# Patient Record
Sex: Male | Born: 1945 | Race: White | Hispanic: No | Marital: Married | Smoking: Never smoker
Health system: Southern US, Community
[De-identification: ages and names within clinical notes are randomized; demographics above are authoritative.]

## PROBLEM LIST (undated history)

## (undated) DIAGNOSIS — I2510 Atherosclerotic heart disease of native coronary artery without angina pectoris: Secondary | ICD-10-CM

## (undated) DIAGNOSIS — E039 Hypothyroidism, unspecified: Secondary | ICD-10-CM

## (undated) DIAGNOSIS — K219 Gastro-esophageal reflux disease without esophagitis: Secondary | ICD-10-CM

## (undated) DIAGNOSIS — E7800 Pure hypercholesterolemia, unspecified: Secondary | ICD-10-CM

## (undated) DIAGNOSIS — I219 Acute myocardial infarction, unspecified: Secondary | ICD-10-CM

## (undated) DIAGNOSIS — G4730 Sleep apnea, unspecified: Secondary | ICD-10-CM

## (undated) DIAGNOSIS — I10 Essential (primary) hypertension: Secondary | ICD-10-CM

## (undated) DIAGNOSIS — N189 Chronic kidney disease, unspecified: Secondary | ICD-10-CM

## (undated) HISTORY — DX: Sleep apnea, unspecified: G47.30

## (undated) HISTORY — DX: Essential (primary) hypertension: I10

## (undated) HISTORY — DX: Hypothyroidism, unspecified: E03.9

## (undated) HISTORY — DX: Chronic kidney disease, unspecified: N18.9

## (undated) HISTORY — DX: Atherosclerotic heart disease of native coronary artery without angina pectoris: I25.10

## (undated) HISTORY — DX: Acute myocardial infarction, unspecified: I21.9

## (undated) HISTORY — DX: Gastro-esophageal reflux disease without esophagitis: K21.9

## (undated) HISTORY — DX: Pure hypercholesterolemia, unspecified: E78.00

---
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
## 2010-10-30 ENCOUNTER — Encounter: Payer: Self-pay | Admitting: Family Medicine

## 2010-10-30 DIAGNOSIS — E039 Hypothyroidism, unspecified: Secondary | ICD-10-CM | POA: Insufficient documentation

## 2010-10-30 DIAGNOSIS — E7800 Pure hypercholesterolemia, unspecified: Secondary | ICD-10-CM | POA: Insufficient documentation

## 2010-10-30 DIAGNOSIS — I10 Essential (primary) hypertension: Secondary | ICD-10-CM | POA: Insufficient documentation

## 2010-10-30 DIAGNOSIS — K5190 Ulcerative colitis, unspecified, without complications: Secondary | ICD-10-CM | POA: Insufficient documentation

## 2011-05-07 DIAGNOSIS — K639 Disease of intestine, unspecified: Secondary | ICD-10-CM | POA: Diagnosis not present

## 2011-05-07 DIAGNOSIS — K5150 Left sided colitis without complications: Secondary | ICD-10-CM | POA: Diagnosis not present

## 2011-05-07 DIAGNOSIS — K648 Other hemorrhoids: Secondary | ICD-10-CM | POA: Diagnosis not present

## 2011-05-07 DIAGNOSIS — K6389 Other specified diseases of intestine: Secondary | ICD-10-CM | POA: Diagnosis not present

## 2011-05-07 DIAGNOSIS — D126 Benign neoplasm of colon, unspecified: Secondary | ICD-10-CM | POA: Diagnosis not present

## 2011-05-07 DIAGNOSIS — K5190 Ulcerative colitis, unspecified, without complications: Secondary | ICD-10-CM | POA: Diagnosis not present

## 2011-10-29 DIAGNOSIS — E785 Hyperlipidemia, unspecified: Secondary | ICD-10-CM | POA: Diagnosis not present

## 2011-10-29 DIAGNOSIS — I10 Essential (primary) hypertension: Secondary | ICD-10-CM | POA: Diagnosis not present

## 2011-10-29 DIAGNOSIS — E039 Hypothyroidism, unspecified: Secondary | ICD-10-CM | POA: Diagnosis not present

## 2011-10-29 DIAGNOSIS — Z125 Encounter for screening for malignant neoplasm of prostate: Secondary | ICD-10-CM | POA: Diagnosis not present

## 2011-10-29 DIAGNOSIS — E559 Vitamin D deficiency, unspecified: Secondary | ICD-10-CM | POA: Diagnosis not present

## 2011-11-05 DIAGNOSIS — E785 Hyperlipidemia, unspecified: Secondary | ICD-10-CM | POA: Diagnosis not present

## 2011-11-05 DIAGNOSIS — I10 Essential (primary) hypertension: Secondary | ICD-10-CM | POA: Diagnosis not present

## 2011-11-21 DIAGNOSIS — D126 Benign neoplasm of colon, unspecified: Secondary | ICD-10-CM | POA: Diagnosis not present

## 2011-11-21 DIAGNOSIS — K5900 Constipation, unspecified: Secondary | ICD-10-CM | POA: Diagnosis not present

## 2011-11-21 DIAGNOSIS — K5150 Left sided colitis without complications: Secondary | ICD-10-CM | POA: Diagnosis not present

## 2011-12-03 DIAGNOSIS — K6389 Other specified diseases of intestine: Secondary | ICD-10-CM | POA: Diagnosis not present

## 2011-12-03 DIAGNOSIS — K228 Other specified diseases of esophagus: Secondary | ICD-10-CM | POA: Diagnosis not present

## 2011-12-03 DIAGNOSIS — K2970 Gastritis, unspecified, without bleeding: Secondary | ICD-10-CM | POA: Diagnosis not present

## 2011-12-03 DIAGNOSIS — K319 Disease of stomach and duodenum, unspecified: Secondary | ICD-10-CM | POA: Diagnosis not present

## 2011-12-03 DIAGNOSIS — K269 Duodenal ulcer, unspecified as acute or chronic, without hemorrhage or perforation: Secondary | ICD-10-CM | POA: Diagnosis not present

## 2011-12-03 DIAGNOSIS — K2990 Gastroduodenitis, unspecified, without bleeding: Secondary | ICD-10-CM | POA: Diagnosis not present

## 2011-12-03 DIAGNOSIS — D126 Benign neoplasm of colon, unspecified: Secondary | ICD-10-CM | POA: Diagnosis not present

## 2011-12-03 DIAGNOSIS — K259 Gastric ulcer, unspecified as acute or chronic, without hemorrhage or perforation: Secondary | ICD-10-CM | POA: Diagnosis not present

## 2011-12-03 DIAGNOSIS — K5120 Ulcerative (chronic) proctitis without complications: Secondary | ICD-10-CM | POA: Diagnosis not present

## 2011-12-03 DIAGNOSIS — D131 Benign neoplasm of stomach: Secondary | ICD-10-CM | POA: Diagnosis not present

## 2011-12-03 DIAGNOSIS — K5150 Left sided colitis without complications: Secondary | ICD-10-CM | POA: Diagnosis not present

## 2012-01-21 DIAGNOSIS — D131 Benign neoplasm of stomach: Secondary | ICD-10-CM | POA: Diagnosis not present

## 2012-01-21 DIAGNOSIS — K228 Other specified diseases of esophagus: Secondary | ICD-10-CM | POA: Diagnosis not present

## 2012-01-21 DIAGNOSIS — K279 Peptic ulcer, site unspecified, unspecified as acute or chronic, without hemorrhage or perforation: Secondary | ICD-10-CM | POA: Diagnosis not present

## 2012-01-21 DIAGNOSIS — K2990 Gastroduodenitis, unspecified, without bleeding: Secondary | ICD-10-CM | POA: Diagnosis not present

## 2012-01-21 DIAGNOSIS — D490 Neoplasm of unspecified behavior of digestive system: Secondary | ICD-10-CM | POA: Diagnosis not present

## 2012-01-21 DIAGNOSIS — K2970 Gastritis, unspecified, without bleeding: Secondary | ICD-10-CM | POA: Diagnosis not present

## 2012-01-21 DIAGNOSIS — Z8711 Personal history of peptic ulcer disease: Secondary | ICD-10-CM | POA: Diagnosis not present

## 2012-01-21 DIAGNOSIS — K319 Disease of stomach and duodenum, unspecified: Secondary | ICD-10-CM | POA: Diagnosis not present

## 2012-02-27 DIAGNOSIS — R933 Abnormal findings on diagnostic imaging of other parts of digestive tract: Secondary | ICD-10-CM | POA: Diagnosis not present

## 2012-02-27 DIAGNOSIS — K5190 Ulcerative colitis, unspecified, without complications: Secondary | ICD-10-CM | POA: Diagnosis not present

## 2012-02-27 DIAGNOSIS — I10 Essential (primary) hypertension: Secondary | ICD-10-CM | POA: Diagnosis not present

## 2012-02-27 DIAGNOSIS — K219 Gastro-esophageal reflux disease without esophagitis: Secondary | ICD-10-CM | POA: Diagnosis not present

## 2012-02-27 DIAGNOSIS — K319 Disease of stomach and duodenum, unspecified: Secondary | ICD-10-CM | POA: Diagnosis not present

## 2012-07-17 ENCOUNTER — Other Ambulatory Visit (INDEPENDENT_AMBULATORY_CARE_PROVIDER_SITE_OTHER): Payer: Medicare Other

## 2012-07-17 DIAGNOSIS — Z79899 Other long term (current) drug therapy: Secondary | ICD-10-CM | POA: Diagnosis not present

## 2012-07-17 DIAGNOSIS — E038 Other specified hypothyroidism: Secondary | ICD-10-CM | POA: Diagnosis not present

## 2012-07-17 DIAGNOSIS — E785 Hyperlipidemia, unspecified: Secondary | ICD-10-CM

## 2012-07-17 LAB — CBC WITH DIFFERENTIAL/PLATELET
Eosinophils Absolute: 0.2 10*3/uL (ref 0.0–0.7)
Eosinophils Relative: 4 % (ref 0–5)
HCT: 38.8 % — ABNORMAL LOW (ref 39.0–52.0)
Hemoglobin: 13 g/dL (ref 13.0–17.0)
Lymphs Abs: 1.3 10*3/uL (ref 0.7–4.0)
MCH: 30.6 pg (ref 26.0–34.0)
MCV: 91.3 fL (ref 78.0–100.0)
Monocytes Absolute: 0.5 10*3/uL (ref 0.1–1.0)
Monocytes Relative: 9 % (ref 3–12)
Neutrophils Relative %: 63 % (ref 43–77)
RBC: 4.25 MIL/uL (ref 4.22–5.81)

## 2012-07-18 LAB — LIPID PANEL
Cholesterol: 146 mg/dL (ref 0–200)
Triglycerides: 99 mg/dL (ref <150)
VLDL: 20 mg/dL (ref 0–40)

## 2012-07-18 LAB — COMPREHENSIVE METABOLIC PANEL
BUN: 12 mg/dL (ref 6–23)
CO2: 29 mEq/L (ref 19–32)
Glucose, Bld: 109 mg/dL — ABNORMAL HIGH (ref 70–99)
Sodium: 143 mEq/L (ref 135–145)
Total Bilirubin: 0.4 mg/dL (ref 0.3–1.2)
Total Protein: 6.4 g/dL (ref 6.0–8.3)

## 2012-08-01 ENCOUNTER — Encounter: Payer: Self-pay | Admitting: Family Medicine

## 2012-08-01 ENCOUNTER — Ambulatory Visit (INDEPENDENT_AMBULATORY_CARE_PROVIDER_SITE_OTHER): Payer: Medicare Other | Admitting: Family Medicine

## 2012-08-01 VITALS — BP 120/72 | HR 60 | Temp 97.9°F | Resp 14 | Wt 209.0 lb

## 2012-08-01 DIAGNOSIS — E038 Other specified hypothyroidism: Secondary | ICD-10-CM

## 2012-08-01 DIAGNOSIS — I10 Essential (primary) hypertension: Secondary | ICD-10-CM

## 2012-08-01 DIAGNOSIS — E785 Hyperlipidemia, unspecified: Secondary | ICD-10-CM

## 2012-08-01 DIAGNOSIS — E039 Hypothyroidism, unspecified: Secondary | ICD-10-CM

## 2012-08-01 MED ORDER — ATORVASTATIN CALCIUM 40 MG PO TABS: 40.0000 mg | ORAL_TABLET | Freq: Every day | ORAL | Status: DC

## 2012-08-01 MED ORDER — BENAZEPRIL HCL 40 MG PO TABS: 40.0000 mg | ORAL_TABLET | Freq: Every day | ORAL | Status: DC

## 2012-08-01 MED ORDER — ATENOLOL 25 MG PO TABS: 25.0000 mg | ORAL_TABLET | Freq: Every day | ORAL | Status: DC

## 2012-08-01 NOTE — Progress Notes (Signed)
Subjective:     Patient ID: Stuart Gentry, male   DOB: 1945-05-05, 67 y.o.   MRN: [id number]  HPI Patient is here for followup for his hypertension. He is currently taking atenolol 25 mg by mouth daily and benazepril 40 mg by mouth daily and Norvasc 10 mg by mouth daily.  He denies any chest pain short of breath or dyspnea on exertion.  He is also taking Lipitor 40 mg by mouth daily. He denies any right upper quadrant pain or myalgias. His last fasting lipid panel revealed an HDL greater than 40 and LDL well below 100.  He also has subclinical hypothyroidism. His TSH has risen to greater than 9. He is still asymptomatic with no clinical goiter.   Past Medical History  Diagnosis Date  . Hypertension   . Ulcerative colitis   . Hypercholesteremia   . Hypothyroidism    History   Social History  . Marital Status: Married    Spouse Name: N/A    Number of Children: N/A  . Years of Education: N/A   Occupational History  . Not on file.   Social History Main Topics  . Smoking status: Never Smoker   . Smokeless tobacco: Not on file  . Alcohol Use: Yes     Comment: ocassional  . Drug Use: No  . Sexually Active: Not on file   Other Topics Concern  . Not on file   Social History Narrative  . No narrative on file        Review of Systems Review of systems is otherwise negative    Objective:   Physical Exam  Constitutional: He appears well-developed and well-nourished.  Eyes: Conjunctivae are normal. Pupils are equal, round, and reactive to light.  Neck: Normal range of motion. Neck supple. No thyromegaly present.  Cardiovascular: Normal rate, regular rhythm, normal heart sounds and intact distal pulses.   No murmur heard. Pulmonary/Chest: Effort normal and breath sounds normal. He has no wheezes. He has no rales. He exhibits no tenderness.  Abdominal: Soft. Bowel sounds are normal. He exhibits no distension. There is no tenderness. There is no rebound and no guarding.        Assessment:     Essential hypertension, benign  Subclinical hypothyroidism  Other and unspecified hyperlipidemia      Plan:     I reviewed the patient's lab work including a CMP, CBC, fasting lipid panel, TSH. Discuss his slightly elevated blood sugar of 109. Recommend a low carbohydrate diet, increasing aerobic exercise, and weight loss. Will recheck this in 6 months .  Pressures well controlled continue his current medications. His cholesterol is also well controlled continue Lipitor .    Recheck a TSH in 6 months. He is asymptomatic so we will not initiate medication at the present time .

## 2012-10-28 ENCOUNTER — Other Ambulatory Visit: Payer: Medicare Other

## 2012-10-28 DIAGNOSIS — E785 Hyperlipidemia, unspecified: Secondary | ICD-10-CM

## 2012-10-28 DIAGNOSIS — Z0000 Encounter for general adult medical examination without abnormal findings: Secondary | ICD-10-CM | POA: Diagnosis not present

## 2012-10-28 LAB — CBC WITH DIFFERENTIAL/PLATELET
HCT: 38 % — ABNORMAL LOW (ref 39.0–52.0)
Hemoglobin: 13.3 g/dL (ref 13.0–17.0)
Lymphs Abs: 1.3 10*3/uL (ref 0.7–4.0)
MCH: 31.3 pg (ref 26.0–34.0)
Monocytes Absolute: 0.6 10*3/uL (ref 0.1–1.0)
Monocytes Relative: 8 % (ref 3–12)
Neutro Abs: 4.4 10*3/uL (ref 1.7–7.7)
Neutrophils Relative %: 67 % (ref 43–77)
RBC: 4.25 MIL/uL (ref 4.22–5.81)

## 2012-10-28 LAB — LIPID PANEL
Cholesterol: 164 mg/dL (ref 0–200)
LDL Cholesterol: 97 mg/dL (ref 0–99)
Total CHOL/HDL Ratio: 3.6 Ratio
Triglycerides: 109 mg/dL (ref <150)
VLDL: 22 mg/dL (ref 0–40)

## 2012-10-28 LAB — COMPLETE METABOLIC PANEL WITH GFR
AST: 22 U/L (ref 0–37)
Alkaline Phosphatase: 64 U/L (ref 39–117)
BUN: 17 mg/dL (ref 6–23)
Creat: 1.04 mg/dL (ref 0.50–1.35)
Total Bilirubin: 0.5 mg/dL (ref 0.3–1.2)

## 2012-11-05 ENCOUNTER — Ambulatory Visit (INDEPENDENT_AMBULATORY_CARE_PROVIDER_SITE_OTHER): Payer: Medicare Other | Admitting: Family Medicine

## 2012-11-05 ENCOUNTER — Encounter: Payer: Self-pay | Admitting: Family Medicine

## 2012-11-05 VITALS — BP 118/60 | HR 56 | Temp 98.1°F | Resp 18 | Ht 71.0 in | Wt 211.0 lb

## 2012-11-05 DIAGNOSIS — I10 Essential (primary) hypertension: Secondary | ICD-10-CM

## 2012-11-05 DIAGNOSIS — E039 Hypothyroidism, unspecified: Secondary | ICD-10-CM

## 2012-11-05 DIAGNOSIS — E785 Hyperlipidemia, unspecified: Secondary | ICD-10-CM

## 2012-11-05 DIAGNOSIS — K5190 Ulcerative colitis, unspecified, without complications: Secondary | ICD-10-CM | POA: Diagnosis not present

## 2012-11-05 DIAGNOSIS — E038 Other specified hypothyroidism: Secondary | ICD-10-CM | POA: Diagnosis not present

## 2012-11-05 LAB — TSH: TSH: 7.055 u[IU]/mL — ABNORMAL HIGH (ref 0.350–4.500)

## 2012-11-05 NOTE — Progress Notes (Signed)
Subjective:    Patient ID: Stuart Gentry, male    DOB: 11-06-1945, 67 y.o.   MRN: [id number]  HPI Patient is here for followup of his general medical conditions. #1 he has hypertension. He is currently taking atenolol 25 mg by mouth daily, and in a subpleural 40 mg by mouth daily. He denies any chest pain shortness of breath or dyspnea on exertion  #2 he has hyperlipidemia. He is currently taking Lipitor 40 mg by mouth daily. He denies any myalgias or right upper quadrant pain. She is due check a fasting lipid panel. His most recent labs are listed below. Lab on 10/28/2012  Component Date Value Range Status  . WBC 10/28/2012 6.6  4.0 - 10.5 K/uL Final  . RBC 10/28/2012 4.25  4.22 - 5.81 MIL/uL Final  . Hemoglobin 10/28/2012 13.3  13.0 - 17.0 g/dL Final  . HCT 10/28/2012 38.0* 39.0 - 52.0 % Final  . MCV 10/28/2012 89.4  78.0 - 100.0 fL Final  . MCH 10/28/2012 31.3  26.0 - 34.0 pg Final  . MCHC 10/28/2012 35.0  30.0 - 36.0 g/dL Final  . RDW 10/28/2012 12.9  11.5 - 15.5 % Final  . Platelets 10/28/2012 211  150 - 400 K/uL Final  . Neutrophils Relative % 10/28/2012 67  43 - 77 % Final  . Neutro Abs 10/28/2012 4.4  1.7 - 7.7 K/uL Final  . Lymphocytes Relative 10/28/2012 20  12 - 46 % Final  . Lymphs Abs 10/28/2012 1.3  0.7 - 4.0 K/uL Final  . Monocytes Relative 10/28/2012 8  3 - 12 % Final  . Monocytes Absolute 10/28/2012 0.6  0.1 - 1.0 K/uL Final  . Eosinophils Relative 10/28/2012 4  0 - 5 % Final  . Eosinophils Absolute 10/28/2012 0.3  0.0 - 0.7 K/uL Final  . Basophils Relative 10/28/2012 1  0 - 1 % Final  . Basophils Absolute 10/28/2012 0.0  0.0 - 0.1 K/uL Final  . Smear Review 10/28/2012 Criteria for review not met   Final  . Cholesterol 10/28/2012 164  0 - 200 mg/dL Final   Comment: ATP III Classification:                                < 200        mg/dL        Desirable                               200 - 239     mg/dL        Borderline High                               >=  240        mg/dL        High                             . Triglycerides 10/28/2012 109  <150 mg/dL Final  . HDL 10/28/2012 45  >39 mg/dL Final  . Total CHOL/HDL Ratio 10/28/2012 3.6   Final  . VLDL 10/28/2012 22  0 - 40 mg/dL Final  . LDL Cholesterol 10/28/2012 97  0 - 99 mg/dL Final   Comment:  Total Cholesterol/HDL Ratio:CHD Risk                                                 Coronary Heart Disease Risk Table                                                                 Men       Women                                   1/2 Average Risk              3.4        3.3                                       Average Risk              5.0        4.4                                    2X Average Risk              9.6        7.1                                    3X Average Risk             23.4       11.0                          Use the calculated Patient Ratio above and the CHD Risk table                           to determine the patient's CHD Risk.                          ATP III Classification (LDL):                                < 100        mg/dL         Optimal                               100 - 129     mg/dL         Near or Above Optimal                               130 - 159  mg/dL         Borderline High                               160 - 189     mg/dL         High                                > 190        mg/dL         Very High                             . Sodium 10/28/2012 141  135 - 145 mEq/L Final  . Potassium 10/28/2012 5.0  3.5 - 5.3 mEq/L Final  . Chloride 10/28/2012 106  96 - 112 mEq/L Final  . CO2 10/28/2012 26  19 - 32 mEq/L Final  . Glucose, Bld 10/28/2012 106* 70 - 99 mg/dL Final  . BUN 10/28/2012 17  6 - 23 mg/dL Final  . Creat 10/28/2012 1.04  0.50 - 1.35 mg/dL Final  . Total Bilirubin 10/28/2012 0.5  0.3 - 1.2 mg/dL Final  . Alkaline Phosphatase 10/28/2012 64  39 - 117 U/L Final  . AST 10/28/2012 22  0 - 37 U/L Final  . ALT  10/28/2012 20  0 - 53 U/L Final  . Total Protein 10/28/2012 6.5  6.0 - 8.3 g/dL Final  . Albumin 10/28/2012 4.0  3.5 - 5.2 g/dL Final  . Calcium 10/28/2012 9.7  8.4 - 10.5 mg/dL Final  . GFR, Est African American 10/28/2012 85   Final  . GFR, Est Non African American 10/28/2012 74   Final   Comment:                            The estimated GFR is a calculation valid for adults (>=24 years old)                          that uses the CKD-EPI algorithm to adjust for age and sex. It is                            not to be used for children, pregnant women, hospitalized patients,                             patients on dialysis, or with rapidly changing kidney function.                          According to the NKDEP, eGFR >89 is normal, 60-89 shows mild                          impairment, 30-59 shows moderate impairment, 15-29 shows severe                          impairment and <15 is ESRD.                              His  triglycerides, HDL, and LDL are within normal limits. His labs are significant for a slightly elevated fasting blood sugar of 106. He has not been compliant with a low carbohydrate diet although he is trying.  #3 subclinical hypothyroidism. His last TSH was greater than 9. He is due to be rechecked. He denies any depression, outpatient, skin changes, constipation, or palpitations.  #4 he has ulcerative colitis. However his gastroenterologist in [place] has recently retired.  Like a referral to someone closer. Past Medical History  Diagnosis Date  . Hypertension   . Hypothyroidism   . Hypercholesteremia   . Ulcerative colitis    No past surgical history on file. Current Outpatient Prescriptions on File Prior to Visit  Medication Sig Dispense Refill  . atenolol (TENORMIN) 25 MG tablet Take 1 tablet (25 mg total) by mouth daily.  90 tablet  3  . atorvastatin (LIPITOR) 40 MG tablet Take 1 tablet (40 mg total) by mouth daily.  90 tablet  3  . benazepril (LOTENSIN) 40 MG  tablet Take 1 tablet (40 mg total) by mouth daily.  90 tablet  3  . calcium carbonate (OS-CAL) 600 MG TABS Take 600 mg by mouth 2 (two) times daily with a meal.      . esomeprazole (NEXIUM) 40 MG capsule Take 40 mg by mouth daily before breakfast.      . sulfaSALAzine (AZULFIDINE) 500 MG EC tablet Take 500 mg by mouth 4 (four) times daily.       No current facility-administered medications on file prior to visit.   No Known Allergies History   Social History  . Marital Status: Married    Spouse Name: N/A    Number of Children: N/A  . Years of Education: N/A   Occupational History  . Not on file.   Social History Main Topics  . Smoking status: Never Smoker   . Smokeless tobacco: Not on file  . Alcohol Use: Yes     Comment: ocassional  . Drug Use: No  . Sexually Active: Not on file   Other Topics Concern  . Not on file   Social History Narrative  . No narrative on file      Review of Systems  All other systems reviewed and are negative.       Objective:   Physical Exam  Vitals reviewed. Constitutional: He is oriented to person, place, and time. He appears well-developed and well-nourished.  HENT:  Head: Normocephalic.  Right Ear: External ear normal.  Left Ear: External ear normal.  Nose: Nose normal.  Mouth/Throat: Oropharynx is clear and moist. No oropharyngeal exudate.  Eyes: Conjunctivae and EOM are normal. Pupils are equal, round, and reactive to light. Right eye exhibits no discharge. Left eye exhibits no discharge. No scleral icterus.  Neck: Normal range of motion. Neck supple. No JVD present. No thyromegaly present.  Cardiovascular: Normal rate, regular rhythm, normal heart sounds and intact distal pulses.   No murmur heard. Pulmonary/Chest: Effort normal and breath sounds normal. No respiratory distress. He has no wheezes. He has no rales. He exhibits no tenderness.  Abdominal: Soft. Bowel sounds are normal. He exhibits no distension. There is no  tenderness. There is no rebound and no guarding.  Lymphadenopathy:    He has no cervical adenopathy.  Neurological: He is alert and oriented to person, place, and time. No cranial nerve deficit. He exhibits normal muscle tone. Coordination normal.  Skin: Skin is warm. No rash noted. No erythema. No pallor.  Assessment & Plan:  1. Subclinical hypothyroidism Patient is currently asymptomatic I will follow TSH. - TSH  2. Essential hypertension, benign Blood pressure is currently well controlled. Continue current medications at present dosages.  3. Other and unspecified hyperlipidemia Cholesterol is well controlled. I again reiterated the need to follow a low carbohydrate diet and try to increase aerobic exercise to prevent his hyperglycemia from becoming prediabetes.  4. Ulcerative colitis, unspecified I will make a referral to gastroenterology to manage his ulcerative colitis - Ambulatory referral to Gastroenterology

## 2012-11-10 ENCOUNTER — Encounter: Payer: Self-pay | Admitting: Gastroenterology

## 2012-12-03 ENCOUNTER — Other Ambulatory Visit: Payer: Self-pay

## 2012-12-04 ENCOUNTER — Ambulatory Visit: Payer: Self-pay | Admitting: Gastroenterology

## 2012-12-22 ENCOUNTER — Encounter: Payer: Self-pay | Admitting: Gastroenterology

## 2012-12-22 ENCOUNTER — Ambulatory Visit (INDEPENDENT_AMBULATORY_CARE_PROVIDER_SITE_OTHER): Payer: Medicare Other | Admitting: Gastroenterology

## 2012-12-22 VITALS — BP 128/76 | HR 64 | Ht 71.0 in | Wt 206.8 lb

## 2012-12-22 DIAGNOSIS — K5190 Ulcerative colitis, unspecified, without complications: Secondary | ICD-10-CM | POA: Diagnosis not present

## 2012-12-22 NOTE — Patient Instructions (Addendum)
Check on insurance coverage for LIALDA and CANASA. Continue for now your azulfadine at current dosing. Will further review your records, recommend recall colonoscopy in early 2015 then decide further recall plan based on that exam. Try cutting back on nexium to every other day dosing.  If you feel the same then no reason to increase back to once daily dosing.                                                We are excited to introduce MyChart, a new best-in-class service that provides you online access to important information in your electronic medical record. We want to make it easier for you to view your health information - all in one secure location - when and where you need it. We expect MyChart will enhance the quality of care and service we provide.  When you register for MyChart, you can:    View your test results.    Request appointments and receive appointment reminders via email.    Request medication renewals.    View your medical history, allergies, medications and immunizations.    Communicate with your physician's office through a password-protected site.    Conveniently print information such as your medication lists.  To find out if MyChart is right for you, please talk to a member of our clinical staff today. We will gladly answer your questions about this free health and wellness tool.  If you are age 68 or older and want a member of your family to have access to your record, you must provide written consent by completing a proxy form available at our office. Please speak to our clinical staff about guidelines regarding accounts for patients younger than age 28.  As you activate your MyChart account and need any technical assistance, please call the MyChart technical support line at [phone]) or email your question to [email or web address]. If you email your question(s), please include your name, a return phone number and the best time to reach  you.  If you have non-urgent health-related questions, you can send a message to our office through MyChart at [email or web address]. If you have a medical emergency, call 911.  Thank you for using MyChart as your new health and wellness resource!   MyChart licensed from [employer],  [id number]. Patents Pending.

## 2012-12-22 NOTE — Progress Notes (Signed)
HPI: This is a  very pleasant 67 year old man whom I am meeting for the first time today.  Diagnosed with UC in mid 1980s in [place] bloody diarrhea;  Moved to [facility], GI MD Dr. Ladean Raya cared for 1990s; was on prednisone early on in problem.  Seems to flare 1-2 per year with urgency, mucous output maybe a bit of blood. Will take rowasa enemas for a month. Chronically on sulfasalazine.  He has been getting q two year colonoscopies.  He feels he is due Feb 2015 for surveillance examination.  Has feels that he has only had a very limited left sided disease. Multiple colonoscopies done over the past 20 years. I reviewed many of them from Dr. Ladean Raya;  it is clear that he has had only macroscopic disease in the rectosigmoid colon up to about 25 cm however biopsies of his descending, right colon have also shown changes of chronic inflammatory bowel disease several times. His last colonoscopy outside was January 2015.  Upper endoscopy 2013, small 'ulcers in stomach.'  Ulcers had healed on nexium daily.  Small submucosal lesion in EGD, eventually evaluated at [facility] DR. Janeice Robinson by EUS, was told it was fatty lesion (lipoma) and it needed no further following.  Currently he is feeling very well. He takes sulfasalazine 4 pills twice daily, total of 4 g per day. He takes periodic Rowasa enemas if he has a flare.   Review of systems: Pertinent positive and negative review of systems were noted in the above HPI section. Complete review of systems was performed and was otherwise normal.    Past Medical History  Diagnosis Date  . Hypertension   . Hypothyroidism   . Hypercholesteremia   . Ulcerative colitis   . GERD (gastroesophageal reflux disease)     History reviewed. No pertinent past surgical history.  Current Outpatient Prescriptions  Medication Sig Dispense Refill  . amLODipine (NORVASC) 10 MG tablet Take 10 mg by mouth daily.      Marland Kitchen atenolol (TENORMIN) 25 MG tablet Take 1 tablet (25 mg total) by mouth daily.   90 tablet  3  . atorvastatin (LIPITOR) 40 MG tablet Take 1 tablet (40 mg total) by mouth daily.  90 tablet  3  . benazepril (LOTENSIN) 40 MG tablet Take 1 tablet (40 mg total) by mouth daily.  90 tablet  3  . calcium carbonate (OS-CAL) 600 MG TABS Take 600 mg by mouth 2 (two) times daily with a meal.      . esomeprazole (NEXIUM) 40 MG capsule Take 40 mg by mouth daily before breakfast.      . Multiple Vitamin (MULTIVITAMIN) tablet Take 1 tablet by mouth daily.      Marland Kitchen sulfaSALAzine (AZULFIDINE) 500 MG EC tablet Take 500 mg by mouth 4 (four) times daily.       No current facility-administered medications for this visit.    Allergies as of 12/22/2012  . (No Known Allergies)    Family History  Problem Relation Age of Onset  . Liver cancer Mother   . Heart disease Brother     History   Social History  . Marital Status: Married    Spouse Name: N/A    Number of Children: 3  . Years of Education: N/A   Occupational History  . Retired    Social History Main Topics  . Smoking status: Never Smoker   . Smokeless tobacco: Never Used  . Alcohol Use: Yes     Comment: ocassional  . Drug  Use: No  . Sexual Activity: Not on file   Other Topics Concern  . Not on file   Social History Narrative  . No narrative on file       Physical Exam: BP 128/76  Pulse 64  Ht 5' 11"  (1.803 m)  Wt 206 lb 12.8 oz (93.804 kg)  BMI 28.86 kg/m2 Constitutional: generally well-appearing Psychiatric: alert and oriented x3 Eyes: extraocular movements intact Mouth: oral pharynx moist, no lesions Neck: supple no lymphadenopathy Cardiovascular: heart regular rate and rhythm Lungs: clear to auscultation bilaterally Abdomen: soft, nontender, nondistended, no obvious ascites, no peritoneal signs, normal bowel sounds Extremities: no lower extremity edema bilaterally Skin: no lesions on visible extremities    Assessment and plan: 67 y.o. male with  chronic ulcerative colitis  Macroscopically his  disease has only been limited to his rectosigmoid however pathologically he has had changes of inflammatory bowel disease throughout his entire colon based on my review of his previous colonoscopies over the past many years. He is going to continue sulfasalazine for now at 4 g per day. He is interested in easier dosing, once daily dosing and he is going to look into lialda coverage by his insurance company. She will also look into Canasa suppository coverage. He currently has about a month's supply of Rowasa enemas and he will keep those to use on an as-needed basis. I would like to proceed with surveillance, high-risk screening colonoscopy January 2015 which would be 2 years from his last one. He has had well documented history of previous adenomatous polyps as well as his chronic inflammation. I made decided to change the interval of his surveillance based on my findings at his January 2015 examination. No guidelines question whether he would benefit from every other year surveillance.

## 2013-01-01 DIAGNOSIS — H5200 Hypermetropia, unspecified eye: Secondary | ICD-10-CM | POA: Diagnosis not present

## 2013-01-01 DIAGNOSIS — H52229 Regular astigmatism, unspecified eye: Secondary | ICD-10-CM | POA: Diagnosis not present

## 2013-01-01 DIAGNOSIS — H524 Presbyopia: Secondary | ICD-10-CM | POA: Diagnosis not present

## 2013-01-01 DIAGNOSIS — H26019 Infantile and juvenile cortical, lamellar, or zonular cataract, unspecified eye: Secondary | ICD-10-CM | POA: Diagnosis not present

## 2013-03-05 ENCOUNTER — Other Ambulatory Visit: Payer: Self-pay

## 2013-03-13 ENCOUNTER — Encounter: Payer: Self-pay | Admitting: Gastroenterology

## 2013-03-23 ENCOUNTER — Telehealth: Payer: Self-pay | Admitting: Gastroenterology

## 2013-03-23 ENCOUNTER — Telehealth: Payer: Self-pay | Admitting: Family Medicine

## 2013-03-23 MED ORDER — MESALAMINE 4 G RE ENEM: 4.0000 g | ENEMA | Freq: Two times a day (BID) | RECTAL | Status: DC

## 2013-03-23 NOTE — Telephone Encounter (Signed)
FYI Dr Christella Hartigan I accidentally closed the encounter

## 2013-03-23 NOTE — Telephone Encounter (Signed)
Pt does have a GI MD that he recently saw in August and per Dr. Tanya Nones pt should call the GI MD to refill this medication. Pt's wife aware and will try to get in contact them. If she is unsuccessful we will fill x 1 and then he should get from GI.

## 2013-03-23 NOTE — Telephone Encounter (Signed)
I am okay with refill but this should be from his GI

## 2013-03-23 NOTE — Telephone Encounter (Signed)
Pt called requesting a refill for Mesalamine enema for his ulcerative colitis and he would like a 30 day supply?!?

## 2013-03-23 NOTE — Telephone Encounter (Signed)
Pt would like to have rowasa enema refilled, he is having a UC flare.  I spoke with Mike Gip and she recommended rowasa bid for 2 weeks then qhs,  1 month with 2 refills pt also transferred to [facility] to schedule recall colon

## 2013-03-23 NOTE — Telephone Encounter (Signed)
Thank you :)

## 2013-03-24 ENCOUNTER — Telehealth: Payer: Self-pay | Admitting: Gastroenterology

## 2013-03-24 NOTE — Telephone Encounter (Signed)
Prescription has been verified with the pharmacy and pt called and notified

## 2013-04-14 ENCOUNTER — Encounter: Payer: Self-pay | Admitting: Gastroenterology

## 2013-05-25 ENCOUNTER — Ambulatory Visit (AMBULATORY_SURGERY_CENTER): Payer: Self-pay | Admitting: *Deleted

## 2013-05-25 VITALS — Ht 71.0 in | Wt 215.6 lb

## 2013-05-25 DIAGNOSIS — K5190 Ulcerative colitis, unspecified, without complications: Secondary | ICD-10-CM

## 2013-05-25 MED ORDER — MOVIPREP 100 G PO SOLR: ORAL | Status: DC

## 2013-05-25 NOTE — Progress Notes (Signed)
No allergies to eggs or soy. No prior anesthesia.  No problems with sedation with prior colonoscopy procedures

## 2013-06-03 ENCOUNTER — Telehealth: Payer: Self-pay | Admitting: Gastroenterology

## 2013-06-03 NOTE — Telephone Encounter (Signed)
Pt will come by [facility] 4th floor to pick up coupon for free MoviPrep

## 2013-06-04 NOTE — Telephone Encounter (Signed)
Patient reports that he is having urgency, but is c/o constipation.  He is scheduled for a colonoscopy for Monday.  He is advised that he can try Miralax OTC and asked to keep appt for Monday

## 2013-06-08 ENCOUNTER — Ambulatory Visit (AMBULATORY_SURGERY_CENTER): Payer: Medicare Other | Admitting: Gastroenterology

## 2013-06-08 ENCOUNTER — Telehealth: Payer: Self-pay | Admitting: Gastroenterology

## 2013-06-08 ENCOUNTER — Encounter: Payer: Self-pay | Admitting: Gastroenterology

## 2013-06-08 VITALS — BP 134/81 | HR 69 | Temp 97.9°F | Resp 20 | Ht 71.0 in | Wt 215.0 lb

## 2013-06-08 DIAGNOSIS — K5190 Ulcerative colitis, unspecified, without complications: Secondary | ICD-10-CM | POA: Diagnosis not present

## 2013-06-08 DIAGNOSIS — D126 Benign neoplasm of colon, unspecified: Secondary | ICD-10-CM | POA: Diagnosis not present

## 2013-06-08 DIAGNOSIS — K648 Other hemorrhoids: Secondary | ICD-10-CM

## 2013-06-08 DIAGNOSIS — K5289 Other specified noninfective gastroenteritis and colitis: Secondary | ICD-10-CM

## 2013-06-08 DIAGNOSIS — R933 Abnormal findings on diagnostic imaging of other parts of digestive tract: Secondary | ICD-10-CM

## 2013-06-08 MED ORDER — SODIUM CHLORIDE 0.9 % IV SOLN: 500.0000 mL | INTRAVENOUS | Status: DC

## 2013-06-08 MED ORDER — FLEET ENEMA 7-19 GM/118ML RE ENEM
1.0000 | ENEMA | Freq: Once | RECTAL | Status: AC
  Administered 2013-06-08: 1 via RECTAL

## 2013-06-08 NOTE — Addendum Note (Signed)
Addended by: Cannon Kettle on: 06/08/2013 05:14 PM   Modules accepted: Orders

## 2013-06-08 NOTE — Telephone Encounter (Signed)
On call note. Pt called last night at 2330 and again today at 0630. He did not have adequate results from his prep last night or this morning. Advised to take 3 glasses of Miralax last night and advised to repeat 3 glasses of Miralax this morning. Call back after 0800 if results still not adequate.

## 2013-06-08 NOTE — Telephone Encounter (Addendum)
Called pt back, pt states he has chronic constipation and was worried about the prep, so he was instructed to take miralax starting on Friday to help with the prep. He also took miralax on Saturday as well. Pt was clear liquids yesterday 06/07/13 and drank 1st half of prep at 5pm. At 2300 pt was still having solid stools so he call the on call Dr., Dr. Fuller Plan advised to take 3 doses of miralax, and 3 more doses of miralax in am on 06/08/13, and to call back if prep still inadequate. Pt states stools are watery tan colored now and still worried this is not adequate. Advised pt that I would speak with Dr. [name] and let him know what he wants him to do. Dr. Edison Nasuti advised pt to come in and we would give enema as soon as pt arrived to check results. Pt states understanding of this and will arrived between 9-915 am so we have time for enema before getting pt ready for procedure at 1030 am.-adm

## 2013-06-08 NOTE — Progress Notes (Signed)
Pt received enema upon arrival to admitting, pt tolerated enema with no complications and results were water yellow liquid with sediment at bottom of toilette-adm

## 2013-06-08 NOTE — Op Note (Signed)
[place]  [address]. [place], [address]   COLONOSCOPY PROCEDURE REPORT  PATIENT: Stuart Gentry, Stuart Gentry  MR#: [id number] BIRTHDATE: 01-27-1946 , 67  yrs. old GENDER: Male ENDOSCOPIST: Milus Banister, MD REFERRED BD:[id number] [name], M.D. PROCEDURE DATE:  06/08/2013 PROCEDURE:   Colonoscopy with biopsy First Screening Colonoscopy - Avg.  risk and is 50 yrs.  old or older - No.  Prior Negative Screening - Now for repeat screening. N/A  History of Adenoma - Now for follow-up colonoscopy & has been > or = to 3 yrs.  N/A  Polyps Removed Today? No.  Recommend repeat exam, <10 yrs? Yes.  High risk (family or personal hx). 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 MEDICATIONS: Fentanyl 75 mcg IV, Versed 8 mg IV, and These medications were titrated to patient response per physician's verbal order DESCRIPTION OF PROCEDURE:   After the risks benefits and alternatives of the procedure were thoroughly explained, informed consent was obtained.  A digital rectal exam revealed no abnormalities of the rectum.   The [id number]  endoscope was introduced through the anus and advanced to the terminal ileum which was intubated for a short distance. No adverse events experienced.   The quality of the prep was adequate.  The instrument was then slowly withdrawn as the colon was fully examined.  COLON FINDINGS: The terminal ileum was normal.  There was clear, moderate inflammation from anus to about 60cm proximal (very indistinct transition to normal mucosa).  The right colon was essentially normal appearing.  Biopsies were taken randomly from right colon (jar one) and left colon (jar 2) every 4-5cm, four quadrants.  There were internal hemorrhoids.  The examination was otherwise normal.  Retroflexed views revealed no abnormalities. The time to cecum=8 minutes 32 seconds.  Withdrawal time=11  minutes 33 seconds.  The scope was withdrawn and the procedure completed. COMPLICATIONS: There were no complications.  ENDOSCOPIC IMPRESSION: The terminal ileum was normal.  There was clear, moderate inflammation from anus to about 60cm proximal (very indistinct transition to normal mucosa).  The right colon was essentially normal appearing.  Biopsies were taken randomly from right colon (jar one) and left colon (jar two) every 4-5cm, four quadrants. There were internal hemorrhoids.  The examination was otherwise normal.  RECOMMENDATIONS: Await biopsy results My office will set up return visit in 3-4 weeks to consider other medicine for your IBD, it does not appear that full dose sulfasalazine is controlling your inflammation on this exam.   eSigned:  Milus Banister, MD 06/08/2013 10:35 AM

## 2013-06-08 NOTE — Patient Instructions (Signed)
Discharge instructions given with verbal understanding. Biopsies taken. Resume previous medications. YOU HAD AN ENDOSCOPIC PROCEDURE TODAY AT THE [facility]: Refer to the procedure report that was given to you for any specific questions about what was found during the examination.  If the procedure report does not answer your questions, please call your gastroenterologist to clarify.  If you requested that your care partner not be given the details of your procedure findings, then the procedure report has been included in a sealed envelope for you to review at your convenience later.  YOU SHOULD EXPECT: Some feelings of bloating in the abdomen. Passage of more gas than usual.  Walking can help get rid of the air that was put into your GI tract during the procedure and reduce the bloating. If you had a lower endoscopy (such as a colonoscopy or flexible sigmoidoscopy) you may notice spotting of blood in your stool or on the toilet paper. If you underwent a bowel prep for your procedure, then you may not have a normal bowel movement for a few days.  DIET: Your first meal following the procedure should be a light meal and then it is ok to progress to your normal diet.  A half-sandwich or bowl of soup is an example of a good first meal.  Heavy or fried foods are harder to digest and may make you feel nauseous or bloated.  Likewise meals heavy in dairy and vegetables can cause extra gas to form and this can also increase the bloating.  Drink plenty of fluids but you should avoid alcoholic beverages for 24 hours.  ACTIVITY: Your care partner should take you home directly after the procedure.  You should plan to take it easy, moving slowly for the rest of the day.  You can resume normal activity the day after the procedure however you should NOT DRIVE or use heavy machinery for 24 hours (because of the sedation medicines used during the test).    SYMPTOMS TO REPORT IMMEDIATELY: A gastroenterologist  can be reached at any hour.  During normal business hours, 8:30 AM to 5:00 PM Monday through Friday, call [phone].  After hours and on weekends, please call the GI answering service at [phone] who will take a message and have the physician on call contact you.   Following lower endoscopy (colonoscopy or flexible sigmoidoscopy):  Excessive amounts of blood in the stool  Significant tenderness or worsening of abdominal pains  Swelling of the abdomen that is new, acute  Fever of 100F or higher  FOLLOW UP: If any biopsies were taken you will be contacted by phone or by letter within the next 1-3 weeks.  Call your gastroenterologist if you have not heard about the biopsies in 3 weeks.  Our staff will call the home number listed on your records the next business day following your procedure to check on you and address any questions or concerns that you may have at that time regarding the information given to you following your procedure. This is a courtesy call and so if there is no answer at the home number and we have not heard from you through the emergency physician on call, we will assume that you have returned to your regular daily activities without incident.  SIGNATURES/CONFIDENTIALITY: You and/or your care partner have signed paperwork which will be entered into your electronic medical record.  These signatures attest to the fact that that the information above on your After Visit Summary has been reviewed   and is understood.  Full responsibility of the confidentiality of this discharge information lies with you and/or your care-partner.  

## 2013-06-09 ENCOUNTER — Telehealth: Payer: Self-pay | Admitting: *Deleted

## 2013-06-09 NOTE — Telephone Encounter (Signed)
  Follow up Call-  Call back number 06/08/2013  Post procedure Call Back phone  # [phone]  Permission to leave phone message Yes     Patient questions:  Do you have a fever, pain , or abdominal swelling? no Pain Score  0 *  Have you tolerated food without any problems? yes  Have you been able to return to your normal activities? yes  Do you have any questions about your discharge instructions: Diet   no Medications  no Follow up visit  no  Do you have questions or concerns about your Care? no  Actions: * If pain score is 4 or above: No action needed, pain <4.

## 2013-06-18 ENCOUNTER — Other Ambulatory Visit: Payer: Self-pay | Admitting: Family Medicine

## 2013-06-18 NOTE — Telephone Encounter (Signed)
Medication filled x1 with no refills. Requires office visit before any further refills can be given. Letter sent to patient.

## 2013-07-02 ENCOUNTER — Other Ambulatory Visit: Payer: Medicare Other

## 2013-07-02 DIAGNOSIS — Z125 Encounter for screening for malignant neoplasm of prostate: Secondary | ICD-10-CM | POA: Diagnosis not present

## 2013-07-02 DIAGNOSIS — I10 Essential (primary) hypertension: Secondary | ICD-10-CM

## 2013-07-02 DIAGNOSIS — Z79899 Other long term (current) drug therapy: Secondary | ICD-10-CM | POA: Diagnosis not present

## 2013-07-02 DIAGNOSIS — E785 Hyperlipidemia, unspecified: Secondary | ICD-10-CM

## 2013-07-02 DIAGNOSIS — E039 Hypothyroidism, unspecified: Secondary | ICD-10-CM

## 2013-07-02 LAB — COMPLETE METABOLIC PANEL WITH GFR
ALK PHOS: 59 U/L (ref 39–117)
ALT: 15 U/L (ref 0–53)
AST: 19 U/L (ref 0–37)
Albumin: 3.5 g/dL (ref 3.5–5.2)
BILIRUBIN TOTAL: 0.5 mg/dL (ref 0.2–1.2)
BUN: 11 mg/dL (ref 6–23)
CO2: 25 mEq/L (ref 19–32)
Calcium: 9.1 mg/dL (ref 8.4–10.5)
Chloride: 108 mEq/L (ref 96–112)
Creat: 0.91 mg/dL (ref 0.50–1.35)
GFR, Est African American: >89 mL/min
GFR, Est Non African American: 86 mL/min
Glucose, Bld: 98 mg/dL (ref 70–99)
Potassium: 4.5 mEq/L (ref 3.5–5.3)
SODIUM: 141 meq/L (ref 135–145)
Total Protein: 6.1 g/dL (ref 6.0–8.3)

## 2013-07-02 LAB — CBC WITH DIFFERENTIAL/PLATELET
BASOS ABS: 0.1 10*3/uL (ref 0.0–0.1)
BASOS PCT: 1 % (ref 0–1)
EOS ABS: 0.2 10*3/uL (ref 0.0–0.7)
Eosinophils Relative: 4 % (ref 0–5)
HCT: 37 % — ABNORMAL LOW (ref 39.0–52.0)
HEMOGLOBIN: 12.6 g/dL — AB (ref 13.0–17.0)
Lymphocytes Relative: 24 % (ref 12–46)
Lymphs Abs: 1.2 10*3/uL (ref 0.7–4.0)
MCH: 31 pg (ref 26.0–34.0)
MCHC: 34.1 g/dL (ref 30.0–36.0)
MCV: 90.9 fL (ref 78.0–100.0)
MONOS PCT: 12 % (ref 3–12)
Monocytes Absolute: 0.6 10*3/uL (ref 0.1–1.0)
NEUTROS PCT: 59 % (ref 43–77)
Neutro Abs: 3 10*3/uL (ref 1.7–7.7)
PLATELETS: 209 10*3/uL (ref 150–400)
RBC: 4.07 MIL/uL — ABNORMAL LOW (ref 4.22–5.81)
RDW: 13.1 % (ref 11.5–15.5)
WBC: 5 10*3/uL (ref 4.0–10.5)

## 2013-07-02 LAB — LIPID PANEL
CHOL/HDL RATIO: 3.6 ratio
Cholesterol: 150 mg/dL (ref 0–200)
HDL: 42 mg/dL (ref >39)
LDL CALC: 92 mg/dL (ref 0–99)
TRIGLYCERIDES: 80 mg/dL (ref <150)
VLDL: 16 mg/dL (ref 0–40)

## 2013-07-02 LAB — TSH: TSH: 5.835 u[IU]/mL — ABNORMAL HIGH (ref 0.350–4.500)

## 2013-07-02 LAB — PSA, MEDICARE: PSA: 1.41 ng/mL (ref <=4.00)

## 2013-07-06 ENCOUNTER — Ambulatory Visit (INDEPENDENT_AMBULATORY_CARE_PROVIDER_SITE_OTHER): Payer: Medicare Other | Admitting: Gastroenterology

## 2013-07-06 ENCOUNTER — Encounter: Payer: Self-pay | Admitting: Gastroenterology

## 2013-07-06 VITALS — BP 120/62 | HR 60 | Ht 71.0 in | Wt 215.4 lb

## 2013-07-06 DIAGNOSIS — K5190 Ulcerative colitis, unspecified, without complications: Secondary | ICD-10-CM | POA: Diagnosis not present

## 2013-07-06 MED ORDER — MESALAMINE 4 G RE ENEM: 4.0000 g | ENEMA | Freq: Every day | RECTAL | Status: DC

## 2013-07-06 MED ORDER — MESALAMINE 1.2 G PO TBEC: 2.4000 g | DELAYED_RELEASE_TABLET | Freq: Two times a day (BID) | ORAL | Status: DC

## 2013-07-06 NOTE — Progress Notes (Signed)
Review of pertinent gastrointestinal problems: 1. Diagnosed with UC in mid 1980s in [place] bloody diarrhea; Moved to [facility], GI MD Dr. Ladean Raya cared for 1990s; was on prednisone early on in problem. Seems to flare 1-2 per year with urgency, mucous output maybe a bit of blood. Will take rowasa enemas for a month. Chronically on sulfasalazine. He has been getting q two year colonoscopies.Has believes that he has only had a very limited left sided disease. Multiple colonoscopies done over the past 20 years. I reviewed many of them from Dr. Ladean Raya; it is clear that he has had only macroscopic disease in the rectosigmoid colon up to about 25 cm however biopsies of his descending, right colon have also shown changes of chronic inflammatory bowel disease several times. His last colonoscopy outside was January 2013. Colonoscopy 05/2013 [name] terminal ileum was normal. There was clear, moderate inflammation from anus to about 60cm proximal (very indistinct transition to normal mucosa). The right colon was essentially normal appearing. Biopsies showed chronic and active inflammation in left colon; right colon was normal. 06/2012 changed to mesalamine full strength, nightly rowasa  2. Upper endoscopy 2013, small 'ulcers in stomach.' Ulcers had healed on nexium daily. Small submucosal lesion in EGD, eventually evaluated at [facility]. DR. Janeice Robinson by EUS, was told it was fatty lesion (lipoma) and it needed no further following.   HPI: This is a very pleasant 68 year old man whom I last saw about a month ago. He is here with his wife today.  Has been taking sufla 8 pills per day ( 4 pills twice per day).  2-3 "flares" per year.  Has 4-5 watery stools per day.  + urgency.  Lately watery diarrhea for 6 months.   Changed nexium    Past Medical History  Diagnosis Date  . Hypertension   . Hypothyroidism   . Hypercholesteremia   . Ulcerative colitis   . GERD (gastroesophageal reflux disease)     Past Surgical History  Procedure  Laterality Date  . No prior sugery    . Colonoscopy      Current Outpatient Prescriptions  Medication Sig Dispense Refill  . amLODipine (NORVASC) 10 MG tablet Take 1 tablet by mouth  every day  30 tablet  0  . atenolol (TENORMIN) 25 MG tablet Take 1 tablet (25 mg total) by mouth daily.  90 tablet  3  . atorvastatin (LIPITOR) 40 MG tablet Take 1 tablet (40 mg total) by mouth daily.  90 tablet  3  . benazepril (LOTENSIN) 40 MG tablet Take 1 tablet (40 mg total) by mouth daily.  90 tablet  3  . calcium carbonate (OS-CAL) 600 MG TABS Take 600 mg by mouth 2 (two) times daily with a meal.      . esomeprazole (NEXIUM) 20 MG capsule Take 20 mg by mouth daily at 12 noon.      . mesalamine (ROWASA) 4 G enema Place 4 g rectally as needed. Twice daily for 2 weeks then at bedtime      . Multiple Vitamin (MULTIVITAMIN) tablet Take 1 tablet by mouth daily.      Marland Kitchen sulfaSALAzine (AZULFIDINE) 500 MG EC tablet Take 500 mg by mouth 4 (four) times daily.       No current facility-administered medications for this visit.    Allergies as of 07/06/2013  . (No Known Allergies)    Family History  Problem Relation Age of Onset  . Liver cancer Mother   . Heart disease Brother   .  Colon cancer Neg Hx     History   Social History  . Marital Status: Married    Spouse Name: N/A    Number of Children: 3  . Years of Education: N/A   Occupational History  . Retired    Social History Main Topics  . Smoking status: Never Smoker   . Smokeless tobacco: Never Used  . Alcohol Use: 0.6 oz/week    1 Cans of beer per week  . Drug Use: No  . Sexual Activity: Not on file   Other Topics Concern  . Not on file   Social History Narrative  . No narrative on file      Physical Exam: BP 120/62  Pulse 60  Ht 5' 11"  (1.803 m)  Wt 215 lb 6.4 oz (97.705 kg)  BMI 30.06 kg/m2 Constitutional: generally well-appearing Psychiatric: alert and oriented x3 Abdomen: soft, nontender, nondistended, no obvious  ascites, no peritoneal signs, normal bowel sounds     Assessment and plan: 68 y.o. male with ulcerative colitis  History clear that his colitis is not well controlled on sulfasalazine full-strength we discussed different options. Prednisone tapering pack, mesalamine, immunomodulators. In the end we agreed that he would try mesalamine in place of sulfasalazine. He will start taking lialda 2 pills twice daily, he will resume nightly Rowasa enemas. He'll return to see me in 6-7 weeks. If he is still bothered by the same symptoms then I would likely increase him to immunomodulators strength medicines.

## 2013-07-06 NOTE — Patient Instructions (Signed)
Stop sulfasalazine. Start lialda 2 pills twice daily. Restart rowasa enema nightly. Please return to see Dr. [name] in 6-7 weeks, sooner if needed.

## 2013-07-07 ENCOUNTER — Ambulatory Visit (INDEPENDENT_AMBULATORY_CARE_PROVIDER_SITE_OTHER): Payer: Medicare Other | Admitting: Family Medicine

## 2013-07-07 ENCOUNTER — Encounter: Payer: Self-pay | Admitting: Family Medicine

## 2013-07-07 VITALS — BP 118/68 | HR 58 | Temp 97.3°F | Resp 16 | Ht 71.0 in | Wt 216.0 lb

## 2013-07-07 DIAGNOSIS — I10 Essential (primary) hypertension: Secondary | ICD-10-CM | POA: Diagnosis not present

## 2013-07-07 DIAGNOSIS — Z23 Encounter for immunization: Secondary | ICD-10-CM

## 2013-07-07 DIAGNOSIS — E785 Hyperlipidemia, unspecified: Secondary | ICD-10-CM | POA: Diagnosis not present

## 2013-07-07 NOTE — Addendum Note (Signed)
Addended by: Shary Decamp B on: 07/07/2013 08:58 AM   Modules accepted: Orders

## 2013-07-07 NOTE — Progress Notes (Signed)
Subjective:    Patient ID: Stuart Gentry, male    DOB: 1946/03/02, 68 y.o.   MRN: [id number]  HPI Patient is a very pleasant 68 year old man with 69 history of hypertension and hyperlipidemia. His blood pressures currently well controlled on amlodipine, atenolol, and benazepril. He denies any cough. He denies any chest pain or shortness of breath or dyspnea on exertion. His heart rate is in the low end of normal at 58 beats per minute. However the patient denies any fatigue study dizziness. He is also taking Lipitor 40 mg by mouth daily. He denies any myalgias right upper quadrant pain. His most recent lab work is outstanding and listed below Lab on 07/02/2013  Component Date Value Ref Range Status  . TSH 07/02/2013 5.835* 0.350 - 4.500 uIU/mL Final  . PSA 07/02/2013 1.41  <=4.00 ng/mL Final   Comment: Test Methodology: ECLIA PSA (Electrochemiluminescence Immunoassay)                                                     For PSA values from 2.5-4.0, particularly in younger men <60 years                          old, the AUA and NCCN suggest testing for % Free PSA (3515) and                          evaluation of the rate of increase in PSA (PSA velocity).  . Cholesterol 07/02/2013 150  0 - 200 mg/dL Final   Comment: ATP III Classification:                                < 200        mg/dL        Desirable                               200 - 239     mg/dL        Borderline High                               >= 240        mg/dL        High                             . Triglycerides 07/02/2013 80  <150 mg/dL Final  . HDL 07/02/2013 42  >39 mg/dL Final  . Total CHOL/HDL Ratio 07/02/2013 3.6   Final  . VLDL 07/02/2013 16  0 - 40 mg/dL Final  . LDL Cholesterol 07/02/2013 92  0 - 99 mg/dL Final   Comment:                            Total Cholesterol/HDL Ratio:CHD Risk  Coronary Heart Disease Risk Table                        Men       Women                                   1/2 Average Risk              3.4        3.3                                       Average Risk              5.0        4.4                                    2X Average Risk              9.6        7.1                                    3X Average Risk             23.4       11.0                          Use the calculated Patient Ratio above and the CHD Risk table                           to determine the patient's CHD Risk.                          ATP III Classification (LDL):                                < 100        mg/dL         Optimal                               100 - 129     mg/dL         Near or Above Optimal                               130 - 159     mg/dL         Borderline High                               160 - 189     mg/dL         High                                > 190  mg/dL         Very High                             . WBC 07/02/2013 5.0  4.0 - 10.5 K/uL Final  . RBC 07/02/2013 4.07* 4.22 - 5.81 MIL/uL Final  . Hemoglobin 07/02/2013 12.6* 13.0 - 17.0 g/dL Final  . HCT 07/02/2013 37.0* 39.0 - 52.0 % Final  . MCV 07/02/2013 90.9  78.0 - 100.0 fL Final  . MCH 07/02/2013 31.0  26.0 - 34.0 pg Final  . MCHC 07/02/2013 34.1  30.0 - 36.0 g/dL Final  . RDW 07/02/2013 13.1  11.5 - 15.5 % Final  . Platelets 07/02/2013 209  150 - 400 K/uL Final  . Neutrophils Relative % 07/02/2013 59  43 - 77 % Final  . Neutro Abs 07/02/2013 3.0  1.7 - 7.7 K/uL Final  . Lymphocytes Relative 07/02/2013 24  12 - 46 % Final  . Lymphs Abs 07/02/2013 1.2  0.7 - 4.0 K/uL Final  . Monocytes Relative 07/02/2013 12  3 - 12 % Final  . Monocytes Absolute 07/02/2013 0.6  0.1 - 1.0 K/uL Final  . Eosinophils Relative 07/02/2013 4  0 - 5 % Final  . Eosinophils Absolute 07/02/2013 0.2  0.0 - 0.7 K/uL Final  . Basophils Relative 07/02/2013 1  0 - 1 % Final  . Basophils Absolute 07/02/2013 0.1  0.0 - 0.1 K/uL Final  . Smear  Review 07/02/2013 Criteria for review not met   Final  . Sodium 07/02/2013 141  135 - 145 mEq/L Final  . Potassium 07/02/2013 4.5  3.5 - 5.3 mEq/L Final  . Chloride 07/02/2013 108  96 - 112 mEq/L Final  . CO2 07/02/2013 25  19 - 32 mEq/L Final  . Glucose, Bld 07/02/2013 98  70 - 99 mg/dL Final  . BUN 07/02/2013 11  6 - 23 mg/dL Final  . Creat 07/02/2013 0.91  0.50 - 1.35 mg/dL Final  . Total Bilirubin 07/02/2013 0.5  0.2 - 1.2 mg/dL Final  . Alkaline Phosphatase 07/02/2013 59  39 - 117 U/L Final  . AST 07/02/2013 19  0 - 37 U/L Final  . ALT 07/02/2013 15  0 - 53 U/L Final  . Total Protein 07/02/2013 6.1  6.0 - 8.3 g/dL Final  . Albumin 07/02/2013 3.5  3.5 - 5.2 g/dL Final  . Calcium 07/02/2013 9.1  8.4 - 10.5 mg/dL Final  . GFR, Est African American 07/02/2013 >89   Final  . GFR, Est Non African American 07/02/2013 86   Final   Comment:                            The estimated GFR is a calculation valid for adults (>=25 years old)                          that uses the CKD-EPI algorithm to adjust for age and sex. It is                            not to be used for children, pregnant women, hospitalized patients,                             patients on dialysis, or with  rapidly changing kidney function.                          According to the NKDEP, eGFR >89 is normal, 60-89 shows mild                          impairment, 30-59 shows moderate impairment, 15-29 shows severe                          impairment and <15 is ESRD.                              Past Medical History  Diagnosis Date  . Hypertension   . Hypothyroidism   . Hypercholesteremia   . Ulcerative colitis   . GERD (gastroesophageal reflux disease)     Current Outpatient Prescriptions on File Prior to Visit  Medication Sig Dispense Refill  . amLODipine (NORVASC) 10 MG tablet Take 1 tablet by mouth  every day  30 tablet  0  . atenolol (TENORMIN) 25 MG tablet Take 1 tablet (25 mg total) by mouth daily.  90 tablet  3   . atorvastatin (LIPITOR) 40 MG tablet Take 1 tablet (40 mg total) by mouth daily.  90 tablet  3  . benazepril (LOTENSIN) 40 MG tablet Take 1 tablet (40 mg total) by mouth daily.  90 tablet  3  . calcium carbonate (OS-CAL) 600 MG TABS Take 600 mg by mouth 2 (two) times daily with a meal.      . esomeprazole (NEXIUM) 20 MG capsule Take 20 mg by mouth daily at 12 noon.      . mesalamine (LIALDA) 1.2 G EC tablet Take 2 tablets (2.4 g total) by mouth 2 (two) times daily.  120 tablet  6  . mesalamine (ROWASA) 4 G enema Place 60 mLs (4 g total) rectally at bedtime.  30 Bottle  6  . Multiple Vitamin (MULTIVITAMIN) tablet Take 1 tablet by mouth daily.       No current facility-administered medications on file prior to visit.   No Known Allergies History   Social History  . Marital Status: Married    Spouse Name: N/A    Number of Children: 3  . Years of Education: N/A   Occupational History  . Retired    Social History Main Topics  . Smoking status: Never Smoker   . Smokeless tobacco: Never Used  . Alcohol Use: 0.6 oz/week    1 Cans of beer per week  . Drug Use: No  . Sexual Activity: Not on file   Other Topics Concern  . Not on file   Social History Narrative  . No narrative on file     Review of Systems  All other systems reviewed and are negative.       Objective:   Physical Exam  Vitals reviewed. Constitutional: He appears well-developed and well-nourished.  Neck: Neck supple. No thyromegaly present.  Cardiovascular: Normal rate, regular rhythm and normal heart sounds.  Exam reveals no gallop and no friction rub.   No murmur heard. Pulmonary/Chest: Effort normal and breath sounds normal. No respiratory distress. He has no wheezes. He has no rales.  Abdominal: Soft. Bowel sounds are normal. He exhibits no distension and no mass. There is no tenderness. There is no rebound and no guarding.  Lymphadenopathy:  He has no cervical adenopathy.   patient is slightly  bradycardic at 58 beats per minute        Assessment & Plan:  1. HTN (hypertension) Blood pressure is outstanding. The patient is slightly bradycardic. If he starts to feel fatigued his blood pressure continues to be low, we may want to decrease his atenolol.  2. HLD (hyperlipidemia) Patient's cholesterol is outstanding. I recommended he decrease his Lipitor to 20 mg by mouth daily and recheck a fasting lipid panel in 6 months.  Patient also received Prevnar 13 today in the office.

## 2013-08-03 ENCOUNTER — Other Ambulatory Visit: Payer: Self-pay | Admitting: Family Medicine

## 2013-08-03 ENCOUNTER — Telehealth: Payer: Self-pay | Admitting: Family Medicine

## 2013-08-03 NOTE — Telephone Encounter (Signed)
Call back number is [phone] [facility] Pt is needing a refill on amLODipine (NORVASC) 10 MG tablet atenolol (TENORMIN) 25 MG tablet atorvastatin (LIPITOR) 40 MG tablet benazepril (LOTENSIN) 40 MG tablet Needing 90 day supply on all of these medications

## 2013-08-03 NOTE — Telephone Encounter (Signed)
Refill appropriate and filled per protocol. 

## 2013-08-04 MED ORDER — AMLODIPINE BESYLATE 10 MG PO TABS: ORAL_TABLET | ORAL | Status: DC

## 2013-08-04 MED ORDER — BENAZEPRIL HCL 40 MG PO TABS: ORAL_TABLET | ORAL | Status: DC

## 2013-08-04 MED ORDER — ATORVASTATIN CALCIUM 40 MG PO TABS: 40.0000 mg | ORAL_TABLET | Freq: Every day | ORAL | Status: DC

## 2013-08-04 MED ORDER — ATENOLOL 25 MG PO TABS: ORAL_TABLET | ORAL | Status: DC

## 2013-08-04 NOTE — Telephone Encounter (Signed)
Rx Refilled  

## 2013-10-05 ENCOUNTER — Encounter: Payer: Self-pay | Admitting: Physician Assistant

## 2013-10-05 ENCOUNTER — Ambulatory Visit (INDEPENDENT_AMBULATORY_CARE_PROVIDER_SITE_OTHER): Payer: Medicare Other | Admitting: Physician Assistant

## 2013-10-05 VITALS — BP 142/84 | HR 60 | Temp 98.1°F | Resp 18 | Wt 218.0 lb

## 2013-10-05 DIAGNOSIS — L237 Allergic contact dermatitis due to plants, except food: Secondary | ICD-10-CM

## 2013-10-05 DIAGNOSIS — L255 Unspecified contact dermatitis due to plants, except food: Secondary | ICD-10-CM | POA: Diagnosis not present

## 2013-10-05 MED ORDER — PREDNISONE 20 MG PO TABS: ORAL_TABLET | ORAL | Status: DC

## 2013-10-05 NOTE — Progress Notes (Signed)
Patient ID: Stuart Gentry MRN: [id number], DOB: [birth year], 68 y.o. Date of Encounter: 10/05/2013, 11:21 AM    Chief Complaint:  Chief Complaint  Patient presents with  . poison ivy    both forearms and chest     HPI: 68 y.o. year old white male says that he thinks he got into some poison oak or poison ivy while weed eating about 2 weeks ago. Says that he started developing a rash about 7 days after doing the weed eating. At that time, the rash started on his right wrist. Since then the itchy rash has spread up both of his arms and also an area on his left upper abdomen. Says that over the past one week new bumps keep appearing each day. He has no other areas of rash affecting any other body parts except for those listed above. He has had no swelling in the throat/neck and has had no difficulty breathing.     Home Meds:   Outpatient Prescriptions Prior to Visit  Medication Sig Dispense Refill  . amLODipine (NORVASC) 10 MG tablet Take 1 tablet by mouth  every day  90 tablet  2  . atenolol (TENORMIN) 25 MG tablet Take 1 tablet by mouth  daily.  90 tablet  2  . atorvastatin (LIPITOR) 40 MG tablet Take 1 tablet (40 mg total) by mouth daily.  90 tablet  2  . benazepril (LOTENSIN) 40 MG tablet Take 1 tablet by mouth  daily.  90 tablet  2  . calcium carbonate (OS-CAL) 600 MG TABS Take 600 mg by mouth 2 (two) times daily with a meal.      . esomeprazole (NEXIUM) 20 MG capsule Take 20 mg by mouth daily at 12 noon.      . mesalamine (LIALDA) 1.2 G EC tablet Take 2 tablets (2.4 g total) by mouth 2 (two) times daily.  120 tablet  6  . Multiple Vitamin (MULTIVITAMIN) tablet Take 1 tablet by mouth daily.      . mesalamine (ROWASA) 4 G enema Place 60 mLs (4 g total) rectally at bedtime.  30 Bottle  6   No facility-administered medications prior to visit.    Allergies: No Known Allergies    Review of Systems: See HPI for pertinent ROS. All other ROS negative.    Physical Exam: Blood  pressure 142/84, pulse 60, temperature 98.1 F (36.7 C), temperature source Oral, resp. rate 18, weight 218 lb (98.884 kg)., Body mass index is 30.42 kg/(m^2). General: WNWD WM.  Appears in no acute distress. Neck: Normal. Lungs: Clear bilaterally to auscultation without wheezes, rales, or rhonchi. Breathing is unlabored. Heart: Regular rhythm. No murmurs, rubs, or gallops. Msk:  Strength and tone normal for age. Extremities/Skin: Warm and dry. Right wrist: Is approximately 1 inch diameter area of diffuse erythema. Bilateral forearms have scattered pink papules/vesicles. Left upper abdomen as splotchy pink papules. Neuro: Alert and oriented X 3. Moves all extremities spontaneously. Gait is normal. CNII-XII grossly in tact. Psych:  Responds to questions appropriately with a normal affect.     ASSESSMENT AND PLAN:  68 y.o. year old male with  1. Allergic dermatitis due to poison ivy Discussed using IM and prednisone versus oral. He prefers oral. He is to start this medication now as soon as possible. He is to take every morning as directed regarding the dosing taper. He is to followup if rash worsens or does not resolve with completion of prednisone. - predniSONE (DELTASONE) 20 MG tablet;  Take 3 daily for 2 days, then 2 daily for 2 days, then 1 daily for 2 days.  Dispense: 12 tablet; Refill: 0   Signed, [address] [place], [place], [facility] 10/05/2013 11:21 AM

## 2013-10-22 ENCOUNTER — Telehealth: Payer: Self-pay | Admitting: Gastroenterology

## 2013-10-22 NOTE — Telephone Encounter (Signed)
Stuart Gentry is not covered by insurance and it will cost $1000 per month.  He wants to know if he can go back to sulfasalazine?

## 2013-10-23 MED ORDER — SULFASALAZINE 500 MG PO TABS
2000.0000 mg | ORAL_TABLET | Freq: Three times a day (TID) | ORAL | Status: DC
Start: 2013-10-23 — End: 2013-10-23

## 2013-10-23 MED ORDER — SULFASALAZINE 500 MG PO TABS: 2000.0000 mg | ORAL_TABLET | Freq: Three times a day (TID) | ORAL | Status: DC

## 2013-10-23 NOTE — Telephone Encounter (Signed)
Prescription sent to the local pharmacy and mail order he will call back to schedule f/u

## 2013-10-23 NOTE — Telephone Encounter (Signed)
Yes, please d/c the lialda and start sulfasalazine but at higher dosing schedule  Was taking 542m pills, 4 pills twice daily.  Want to start him on 5054mpills, 4 pills THREE times daily (would be at max dosing 6gm per day). Disp one month with 11 refills.  He needs rov in about one month.

## 2014-02-09 ENCOUNTER — Ambulatory Visit (INDEPENDENT_AMBULATORY_CARE_PROVIDER_SITE_OTHER): Payer: Medicare Other | Admitting: Gastroenterology

## 2014-02-09 ENCOUNTER — Encounter: Payer: Self-pay | Admitting: Gastroenterology

## 2014-02-09 VITALS — BP 142/70 | HR 56 | Ht 70.0 in | Wt 217.4 lb

## 2014-02-09 DIAGNOSIS — K5190 Ulcerative colitis, unspecified, without complications: Secondary | ICD-10-CM

## 2014-02-09 NOTE — Progress Notes (Signed)
Review of pertinent gastrointestinal problems:  1. Diagnosed with UC in mid 1980s in [place] bloody diarrhea; Moved to [facility], GI MD Dr. Ladean Raya cared for 1990s; was on prednisone early on in problem. Seems to flare 1-2 per year with urgency, mucous output maybe a bit of blood. Will take rowasa enemas for a month. Chronically on sulfasalazine. He has been getting q two year colonoscopies.Has believes that he has only had a very limited left sided disease. Multiple colonoscopies done over the past 20 years. I reviewed many of them from Dr. Ladean Raya; it is clear that he has had only macroscopic disease in the rectosigmoid colon up to about 25 cm however biopsies of his descending, right colon have also shown changes of chronic inflammatory bowel disease several times. His last colonoscopy outside was January 2013. Colonoscopy 05/2013 [name] terminal ileum was normal. There was clear, moderate inflammation from anus to about 60cm proximal (very indistinct transition to normal mucosa). The right colon was essentially normal appearing. Biopsies showed chronic and active inflammation in left colon; right colon was normal. 06/2012 changed to mesalamine full strength, nightly rowasa.  2015, continued intermittent flares, offered several options for medical management, decided to increase the sulfasalazine to 6 g daily, also mesalamine enemas.  2. Upper endoscopy 2013, small 'ulcers in stomach.' Ulcers had healed on nexium daily. Small submucosal lesion in EGD, eventually evaluated at [facility] DR. Janeice Robinson by EUS, was told it was fatty lesion (lipoma) and it needed no further following.   HPI: This is a very pleasant 68 year old man whom I last saw several months ago. He has been taking 6 g of sulfasalazine daily and has for the most part of well. He was having a bit of a flare with increased frequency, restarted Rowasa enemas nightly and bowel habits went back to his usual. He had no pain.    Past Medical History  Diagnosis Date  .  Hypertension   . Hypothyroidism   . Hypercholesteremia   . Ulcerative colitis   . GERD (gastroesophageal reflux disease)     Past Surgical History  Procedure Laterality Date  . No prior sugery    . Colonoscopy      Current Outpatient Prescriptions  Medication Sig Dispense Refill  . amLODipine (NORVASC) 10 MG tablet Take 1 tablet by mouth  every day  90 tablet  2  . atenolol (TENORMIN) 25 MG tablet Take 1 tablet by mouth  daily.  90 tablet  2  . atorvastatin (LIPITOR) 40 MG tablet Take 1 tablet (40 mg total) by mouth daily.  90 tablet  2  . benazepril (LOTENSIN) 40 MG tablet Take 1 tablet by mouth  daily.  90 tablet  2  . calcium carbonate (OS-CAL) 600 MG TABS Take 600 mg by mouth 2 (two) times daily with a meal.      . esomeprazole (NEXIUM) 20 MG capsule Take 20 mg by mouth daily at 12 noon.      . mesalamine (ROWASA) 4 G enema Place 4 g rectally at bedtime as needed.      . Multiple Vitamin (MULTIVITAMIN) tablet Take 1 tablet by mouth daily.      Marland Kitchen sulfaSALAzine (AZULFIDINE) 500 MG tablet Take 4 tablets (2,000 mg total) by mouth 3 (three) times daily.  360 tablet  0   No current facility-administered medications for this visit.    Allergies as of 02/09/2014  . (No Known Allergies)    Family History  Problem Relation Age of Onset  .  Liver cancer Mother   . Heart disease Brother   . Colon cancer Neg Hx     History   Social History  . Marital Status: Married    Spouse Name: N/A    Number of Children: 3  . Years of Education: N/A   Occupational History  . Retired    Social History Main Topics  . Smoking status: Never Smoker   . Smokeless tobacco: Never Used  . Alcohol Use: 0.6 oz/week    1 Cans of beer per week  . Drug Use: No  . Sexual Activity: Not on file   Other Topics Concern  . Not on file   Social History Narrative  . No narrative on file      Physical Exam: BP 142/70  Pulse 56  Ht 5' 10"  (1.778 m)  Wt 217 lb 6 oz (98.601 kg)  BMI 31.19  kg/m2 Constitutional: generally well-appearing Psychiatric: alert and oriented x3 Abdomen: soft, nontender, nondistended, no obvious ascites, no peritoneal signs, normal bowel sounds     Assessment and plan: 68 y.o. male with  ulcerative colitis  He does not feel that sulfasalazine is holding his symptoms, controlling his colitis as well as mesalamine products day. He has Medicare and is in the "donut hole" and so cannot afford the $2000 per month that mesalamine orally would cost him. We have no financial solution for this. In the end we decided to keep him on his sulfasalazine 6 g daily, it seems to be working currently. He just refill the medicine for 3 months. He will be due for further refills in early to mid-January. I explained to him that I want to call here around that time and we will prescribe him sulfasalazine same dose. Will also prescribe mesalamine (lialda 2 pills twice daily).  Marland Kitchen He will use these medicines on alternating days. This may keep him out of the donut hole till deep into the fall.  He will return to see me in 6 months and sooner if needed.

## 2014-02-09 NOTE — Patient Instructions (Signed)
Follow up in 6 months with Dr [name], you will receive a letter when it is time to set that appointment up.

## 2014-05-05 ENCOUNTER — Telehealth: Payer: Self-pay | Admitting: Gastroenterology

## 2014-05-05 MED ORDER — MESALAMINE 1.2 G PO TBEC: 2.4000 g | DELAYED_RELEASE_TABLET | Freq: Two times a day (BID) | ORAL | Status: DC

## 2014-05-05 NOTE — Telephone Encounter (Signed)
I sent in lialda as directed per Dr [name]

## 2014-06-29 ENCOUNTER — Encounter: Payer: Self-pay | Admitting: Gastroenterology

## 2014-07-01 ENCOUNTER — Other Ambulatory Visit: Payer: Medicare Other

## 2014-07-01 DIAGNOSIS — E039 Hypothyroidism, unspecified: Secondary | ICD-10-CM

## 2014-07-01 DIAGNOSIS — Z79899 Other long term (current) drug therapy: Secondary | ICD-10-CM

## 2014-07-01 DIAGNOSIS — I10 Essential (primary) hypertension: Secondary | ICD-10-CM | POA: Diagnosis not present

## 2014-07-01 DIAGNOSIS — E785 Hyperlipidemia, unspecified: Secondary | ICD-10-CM | POA: Diagnosis not present

## 2014-07-01 LAB — CBC WITH DIFFERENTIAL/PLATELET
Basophils Absolute: 0.1 10*3/uL (ref 0.0–0.1)
Basophils Relative: 1 % (ref 0–1)
EOS PCT: 4 % (ref 0–5)
Eosinophils Absolute: 0.2 10*3/uL (ref 0.0–0.7)
HEMATOCRIT: 39.3 % (ref 39.0–52.0)
Hemoglobin: 13.3 g/dL (ref 13.0–17.0)
LYMPHS ABS: 1.3 10*3/uL (ref 0.7–4.0)
Lymphocytes Relative: 26 % (ref 12–46)
MCH: 31.5 pg (ref 26.0–34.0)
MCHC: 33.8 g/dL (ref 30.0–36.0)
MCV: 93.1 fL (ref 78.0–100.0)
MONOS PCT: 10 % (ref 3–12)
MPV: 10.6 fL (ref 8.6–12.4)
Monocytes Absolute: 0.5 10*3/uL (ref 0.1–1.0)
NEUTROS ABS: 3 10*3/uL (ref 1.7–7.7)
NEUTROS PCT: 59 % (ref 43–77)
Platelets: 214 10*3/uL (ref 150–400)
RBC: 4.22 MIL/uL (ref 4.22–5.81)
RDW: 12.8 % (ref 11.5–15.5)
WBC: 5.1 10*3/uL (ref 4.0–10.5)

## 2014-07-01 LAB — COMPLETE METABOLIC PANEL WITH GFR
ALK PHOS: 65 U/L (ref 39–117)
ALT: 16 U/L (ref 0–53)
AST: 19 U/L (ref 0–37)
Albumin: 3.9 g/dL (ref 3.5–5.2)
BILIRUBIN TOTAL: 0.4 mg/dL (ref 0.2–1.2)
BUN: 14 mg/dL (ref 6–23)
CO2: 27 mEq/L (ref 19–32)
CREATININE: 0.86 mg/dL (ref 0.50–1.35)
Calcium: 9.4 mg/dL (ref 8.4–10.5)
Chloride: 105 mEq/L (ref 96–112)
GFR, Est Non African American: 88 mL/min
Glucose, Bld: 106 mg/dL — ABNORMAL HIGH (ref 70–99)
Potassium: 4.8 mEq/L (ref 3.5–5.3)
Sodium: 140 mEq/L (ref 135–145)
Total Protein: 6.4 g/dL (ref 6.0–8.3)

## 2014-07-01 LAB — LIPID PANEL
Cholesterol: 173 mg/dL (ref 0–200)
HDL: 43 mg/dL (ref >=40)
LDL Cholesterol: 107 mg/dL — ABNORMAL HIGH (ref 0–99)
Total CHOL/HDL Ratio: 4 Ratio
Triglycerides: 117 mg/dL (ref <150)
VLDL: 23 mg/dL (ref 0–40)

## 2014-07-01 LAB — TSH: TSH: 6.03 u[IU]/mL — AB (ref 0.350–4.500)

## 2014-07-08 ENCOUNTER — Ambulatory Visit (INDEPENDENT_AMBULATORY_CARE_PROVIDER_SITE_OTHER): Payer: Medicare Other | Admitting: Family Medicine

## 2014-07-08 ENCOUNTER — Encounter: Payer: Self-pay | Admitting: Family Medicine

## 2014-07-08 VITALS — BP 132/82 | HR 60 | Temp 97.5°F | Resp 14 | Ht 71.0 in | Wt 221.0 lb

## 2014-07-08 DIAGNOSIS — E038 Other specified hypothyroidism: Secondary | ICD-10-CM

## 2014-07-08 DIAGNOSIS — E785 Hyperlipidemia, unspecified: Secondary | ICD-10-CM | POA: Diagnosis not present

## 2014-07-08 DIAGNOSIS — R739 Hyperglycemia, unspecified: Secondary | ICD-10-CM

## 2014-07-08 DIAGNOSIS — Z125 Encounter for screening for malignant neoplasm of prostate: Secondary | ICD-10-CM | POA: Diagnosis not present

## 2014-07-08 DIAGNOSIS — I10 Essential (primary) hypertension: Secondary | ICD-10-CM

## 2014-07-08 DIAGNOSIS — E039 Hypothyroidism, unspecified: Secondary | ICD-10-CM

## 2014-07-08 NOTE — Progress Notes (Signed)
Subjective:    Patient ID: Stuart Gentry, male    DOB: [birth year], 69 y.o.   MRN: [id number]  HPI  Patient has a history of hypertension. He is currently on amlodipine, atenolol, and benazepril. He denies any chest pain shortness of breath or dyspnea on exertion. He also has a history of hyperlipidemia for which he takes atorvastatin 20 mg a day. We reduced his dose by 50% last year. He denies any myalgias or right upper quadrant pain. He is due for prostate cancer screening. He denies any urgency or hesitancy or weak urinary stream area and on his most recent fasting blood work he was found to have an elevated blood sugar of 106. He admits that he eats more carbohydrates than he should. He is not regularly exercising. His goal weight would be closer to 200 pounds and today he is 221. He also has subclinical hypothyroidism with an elevated TSH. He denies any weakness or fatigue. At the present time he is not interested in taking a medication for this and are no clinical indications for that. Lab on 07/01/2014  Component Date Value Ref Range Status  . Sodium 07/01/2014 140  135 - 145 mEq/L Final  . Potassium 07/01/2014 4.8  3.5 - 5.3 mEq/L Final  . Chloride 07/01/2014 105  96 - 112 mEq/L Final  . CO2 07/01/2014 27  19 - 32 mEq/L Final  . Glucose, Bld 07/01/2014 106* 70 - 99 mg/dL Final  . BUN 07/01/2014 14  6 - 23 mg/dL Final  . Creat 07/01/2014 0.86  0.50 - 1.35 mg/dL Final  . Total Bilirubin 07/01/2014 0.4  0.2 - 1.2 mg/dL Final  . Alkaline Phosphatase 07/01/2014 65  39 - 117 U/L Final  . AST 07/01/2014 19  0 - 37 U/L Final  . ALT 07/01/2014 16  0 - 53 U/L Final  . Total Protein 07/01/2014 6.4  6.0 - 8.3 g/dL Final  . Albumin 07/01/2014 3.9  3.5 - 5.2 g/dL Final  . Calcium 07/01/2014 9.4  8.4 - 10.5 mg/dL Final  . GFR, Est African American 07/01/2014 >89   Final  . GFR, Est Non African American 07/01/2014 88   Final   Comment:   The estimated GFR is a calculation valid for adults  (>=21 years old) that uses the CKD-EPI algorithm to adjust for age and sex. It is   not to be used for children, pregnant women, hospitalized patients,    patients on dialysis, or with rapidly changing kidney function. According to the NKDEP, eGFR >89 is normal, 60-89 shows mild impairment, 30-59 shows moderate impairment, 15-29 shows severe impairment and <15 is ESRD.     . TSH 07/01/2014 6.030* 0.350 - 4.500 uIU/mL Final  . Cholesterol 07/01/2014 173  0 - 200 mg/dL Final   Comment: ATP III Classification:       < 200        mg/dL        Desirable      200 - 239     mg/dL        Borderline High      >= 240        mg/dL        High     . Triglycerides 07/01/2014 117  <150 mg/dL Final  . HDL 07/01/2014 43  >=40 mg/dL Final   ** Please note change in reference range(s). **  . Total CHOL/HDL Ratio 07/01/2014 4.0   Final  . VLDL 07/01/2014 23  0 - 40 mg/dL Final  . LDL Cholesterol 07/01/2014 107* 0 - 99 mg/dL Final   Comment:   Total Cholesterol/HDL Ratio:CHD Risk                        Coronary Heart Disease Risk Table                                        Men       Women          1/2 Average Risk              3.4        3.3              Average Risk              5.0        4.4           2X Average Risk              9.6        7.1           3X Average Risk             23.4       11.0 Use the calculated Patient Ratio above and the CHD Risk table  to determine the patient's CHD Risk. ATP III Classification (LDL):       < 100        mg/dL         Optimal      005 - 129     mg/dL         Near or Above Optimal      130 - 159     mg/dL         Borderline High      160 - 189     mg/dL         High       > 259        mg/dL         Very High     . WBC 07/01/2014 5.1  4.0 - 10.5 K/uL Final  . RBC 07/01/2014 4.22  4.22 - 5.81 MIL/uL Final  . Hemoglobin 07/01/2014 13.3  13.0 - 17.0 g/dL Final  . HCT 02/24/9021 39.3  39.0 - 52.0 % Final  . MCV 07/01/2014 93.1  78.0 - 100.0 fL Final  .  MCH 07/01/2014 31.5  26.0 - 34.0 pg Final  . MCHC 07/01/2014 33.8  30.0 - 36.0 g/dL Final  . RDW 84/09/9859 12.8  11.5 - 15.5 % Final  . Platelets 07/01/2014 214  150 - 400 K/uL Final  . MPV 07/01/2014 10.6  8.6 - 12.4 fL Final  . Neutrophils Relative % 07/01/2014 59  43 - 77 % Final  . Neutro Abs 07/01/2014 3.0  1.7 - 7.7 K/uL Final  . Lymphocytes Relative 07/01/2014 26  12 - 46 % Final  . Lymphs Abs 07/01/2014 1.3  0.7 - 4.0 K/uL Final  . Monocytes Relative 07/01/2014 10  3 - 12 % Final  . Monocytes Absolute 07/01/2014 0.5  0.1 - 1.0 K/uL Final  . Eosinophils Relative 07/01/2014 4  0 - 5 % Final  . Eosinophils Absolute 07/01/2014 0.2  0.0 - 0.7 K/uL Final  . Basophils Relative 07/01/2014 1  0 - 1 % Final  . Basophils Absolute 07/01/2014 0.1  0.0 - 0.1 K/uL Final  . Smear Review 07/01/2014 Criteria for review not met   Final   Past Medical History  Diagnosis Date  . Hypertension   . Hypothyroidism   . Hypercholesteremia   . Ulcerative colitis   . GERD (gastroesophageal reflux disease)    Past Surgical History  Procedure Laterality Date  . No prior sugery    . Colonoscopy     Current Outpatient Prescriptions on File Prior to Visit  Medication Sig Dispense Refill  . amLODipine (NORVASC) 10 MG tablet Take 1 tablet by mouth  every day 90 tablet 2  . atenolol (TENORMIN) 25 MG tablet Take 1 tablet by mouth  daily. 90 tablet 2  . atorvastatin (LIPITOR) 40 MG tablet Take 1 tablet (40 mg total) by mouth daily. (Patient taking differently: Take 20 mg by mouth daily. ) 90 tablet 2  . benazepril (LOTENSIN) 40 MG tablet Take 1 tablet by mouth  daily. 90 tablet 2  . calcium carbonate (OS-CAL) 600 MG TABS Take 600 mg by mouth 2 (two) times daily with a meal.    . esomeprazole (NEXIUM) 20 MG capsule Take 20 mg by mouth daily at 12 noon.    . mesalamine (LIALDA) 1.2 G EC tablet Take 2 tablets (2.4 g total) by mouth 2 (two) times daily. (Patient taking differently: Take 2.4 g by mouth 2 (two)  times daily. Every other day) 360 tablet 3  . mesalamine (ROWASA) 4 G enema Place 4 g rectally at bedtime as needed.    . Multiple Vitamin (MULTIVITAMIN) tablet Take 1 tablet by mouth daily.    Marland Kitchen sulfaSALAzine (AZULFIDINE) 500 MG tablet Take 4 tablets (2,000 mg total) by mouth 3 (three) times daily. (Patient taking differently: Take 2,000 mg by mouth 4 (four) times daily. Every other day) 360 tablet 0   No current facility-administered medications on file prior to visit.   No Known Allergies History   Social History  . Marital Status: Married    Spouse Name: N/A  . Number of Children: 3  . Years of Education: N/A   Occupational History  . Retired    Social History Main Topics  . Smoking status: Never Smoker   . Smokeless tobacco: Never Used  . Alcohol Use: 0.6 oz/week    1 Cans of beer per week  . Drug Use: No  . Sexual Activity: Not on file   Other Topics Concern  . Not on file   Social History Narrative     Review of Systems  All other systems reviewed and are negative.      Objective:   Physical Exam  Constitutional: He appears well-developed and well-nourished.  HENT:  Mouth/Throat: Oropharynx is clear and moist.  Eyes: Conjunctivae are normal.  Neck: Neck supple. No JVD present. No thyromegaly present.  Cardiovascular: Normal rate, regular rhythm and normal heart sounds.   Pulmonary/Chest: Effort normal and breath sounds normal. No respiratory distress. He has no wheezes. He has no rales. He exhibits no tenderness.  Abdominal: Soft. Bowel sounds are normal. He exhibits no distension and no mass. There is no tenderness. There is no rebound and no guarding.  Musculoskeletal: He exhibits no edema.  Vitals reviewed.         Assessment & Plan:  Prostate cancer screening - Plan: PSA, Medicare  Benign essential HTN  HLD (hyperlipidemia)  Hyperglycemia  Subclinical hypothyroidism  Patient's blood pressure is excellent.  I'll make no changes in his blood  pressure medication at this time. His cholesterol is acceptable. Continue Lipitor 20 mg by mouth daily. I did recommend 20 pounds of weight loss and increasing aerobic exercise and decreasing carbohydrates to address his obesity as well as his hyperglycemia. Continue to monitor his TSH but at the present time there is no indication for treatment. I will also check a PSA today to screen for prostate cancer.

## 2014-07-09 ENCOUNTER — Encounter: Payer: Self-pay | Admitting: *Deleted

## 2014-07-09 LAB — PSA, MEDICARE: PSA: 1.46 ng/mL (ref <=4.00)

## 2014-07-30 ENCOUNTER — Other Ambulatory Visit: Payer: Self-pay | Admitting: Gastroenterology

## 2014-08-02 ENCOUNTER — Other Ambulatory Visit: Payer: Self-pay | Admitting: Family Medicine

## 2014-09-28 ENCOUNTER — Encounter: Payer: Self-pay | Admitting: Gastroenterology

## 2014-09-28 ENCOUNTER — Ambulatory Visit (INDEPENDENT_AMBULATORY_CARE_PROVIDER_SITE_OTHER): Payer: Medicare Other | Admitting: Gastroenterology

## 2014-09-28 VITALS — BP 148/68 | HR 56 | Ht 70.0 in | Wt 220.4 lb

## 2014-09-28 DIAGNOSIS — K51919 Ulcerative colitis, unspecified with unspecified complications: Secondary | ICD-10-CM | POA: Diagnosis not present

## 2014-09-28 NOTE — Patient Instructions (Addendum)
Recall colonoscopy 05/2016 (three years from your last one). Will refill your lialda and sulflasalazine as needed. Please return to see Dr. [name] in 12 months, sooner if any trouble arise.

## 2014-09-28 NOTE — Progress Notes (Signed)
Review of pertinent gastrointestinal problems:  1. [place] in mid 1980s [place] bloody diarrhea; Moved to [facility], GI MD Dr. Ladean Raya cared for 1990s; was on prednisone early on in problem. Seems to flare 1-2 per year with urgency, mucous output maybe a bit of blood. Will take rowasa enemas for a month. Chronically on sulfasalazine. He has been getting q two year colonoscopies.Has believes that he has only had a very limited left sided disease. Multiple colonoscopies done over the past 20 years. I reviewed many of them from Dr. Ladean Raya; it is clear that he has had only macroscopic disease in the rectosigmoid colon up to about 25 cm however biopsies of his descending, right colon have also shown changes of chronic inflammatory bowel disease several times. His last colonoscopy outside was January 2013. Colonoscopy 05/2013 [name] terminal ileum was normal. There was clear, moderate inflammation from anus to about 60cm proximal (very indistinct transition to normal mucosa). The right colon was essentially normal appearing. Biopsies showed chronic and active inflammation in left colon; right colon was normal. 06/2012 changed to mesalamine full strength, nightly rowasa. 2015, continued intermittent flares, offered several options for medical management, decided to increase the sulfasalazine to 6 g daily, also mesalamine enemas.  2. Upper endoscopy 2013, small 'ulcers in stomach.' Ulcers had healed on nexium daily. Small submucosal lesion in EGD, eventually evaluated at [facility]. DR. Janeice Robinson by EUS, was told it was fatty lesion (lipoma) and it needed no further following.    HPI: This is a   very pleasant  Chief complaint is ulcerative colitis  He had one minor flare (loose stool, bright red blood, restarted the rowasa daily for a month and then stopped).  Mostly one BM once daily, sometimes can go 2-3 days.  Formed, soft, brown.  No significant abd pains.  Weight up a bit.  Alternating : 2 pills BID lialda/   Alternating with sulfasalazine 4 pill tid.  was for donut hold reasons.  Non smoker, rare drinker.  Takes tylenol for pains.    Past Medical History  Diagnosis Date  . Hypertension   . Hypothyroidism   . Hypercholesteremia   . Ulcerative colitis   . GERD (gastroesophageal reflux disease)     Past Surgical History  Procedure Laterality Date  . No prior sugery    . Colonoscopy      Current Outpatient Prescriptions  Medication Sig Dispense Refill  . amLODipine (NORVASC) 10 MG tablet Take 1 tablet by mouth  every day 90 tablet 3  . atenolol (TENORMIN) 25 MG tablet Take 1 tablet by mouth  daily 90 tablet 3  . atorvastatin (LIPITOR) 40 MG tablet Take 1 tablet (40 mg total) by mouth daily. (Patient taking differently: Take 20 mg by mouth daily. ) 90 tablet 2  . benazepril (LOTENSIN) 40 MG tablet Take 1 tablet by mouth  daily 90 tablet 3  . calcium carbonate (OS-CAL) 600 MG TABS Take 600 mg by mouth daily.     Marland Kitchen esomeprazole (NEXIUM) 20 MG capsule Take 20 mg by mouth daily at 12 noon.    . mesalamine (LIALDA) 1.2 G EC tablet Take 2 tablets (2.4 g total) by mouth 2 (two) times daily. (Patient taking differently: Take 2.4 g by mouth 2 (two) times daily. Every other day) 360 tablet 3  . mesalamine (ROWASA) 4 G enema Place 4 g rectally at bedtime as needed.    . Multiple Vitamin (MULTIVITAMIN) tablet Take 1 tablet by mouth daily.    Marland Kitchen  sulfaSALAzine (AZULFIDINE) 500 MG tablet Take 4 tablets (2,000 mg total) by mouth 3 (three) times daily. (Patient taking differently: Take 2,000 mg by mouth every other day. Pt takes 4 tablets, every other day) 360 tablet 0   No current facility-administered medications for this visit.    Allergies as of 09/28/2014  . (No Known Allergies)    Family History  Problem Relation Age of Onset  . Liver cancer Mother   . Heart disease Brother   . Colon cancer Neg Hx     History   Social History  . Marital Status: Married    Spouse Name: N/A  . Number  of Children: 3  . Years of Education: N/A   Occupational History  . Retired    Social History Main Topics  . Smoking status: Never Smoker   . Smokeless tobacco: Never Used  . Alcohol Use: 0.6 oz/week    1 Cans of beer per week  . Drug Use: No  . Sexual Activity: Not on file   Other Topics Concern  . Not on file   Social History Narrative     Physical Exam: BP 144/68 mmHg  Pulse 56  Ht 5' 10"  (1.778 m)  Wt 220 lb 6 oz (99.961 kg)  BMI 31.62 kg/m2 Constitutional: generally well-appearing Psychiatric: alert and oriented x3 Abdomen: soft, nontender, nondistended, no obvious ascites, no peritoneal signs, normal bowel sounds   Assessment and plan: 69 y.o. male with left-sided colitis, chronic  He is doing quite well from bowel perspective. Currently takes mesalamine alternating with sulfasalazine every other day. The reason for this is an attempt to avoid donut hole type issues this fall. It is not clear if it will be successful but we will see. He is going to return to see me in one year and sooner if needed. High-risk surveillance colonoscopy I think will be fine to be done 3 years after his last examination which would be February 2018.   [name], MD [facility] 09/28/2014, 3:18 PM

## 2014-10-28 ENCOUNTER — Other Ambulatory Visit: Payer: Self-pay | Admitting: Gastroenterology

## 2014-11-15 ENCOUNTER — Other Ambulatory Visit: Payer: Self-pay | Admitting: Gastroenterology

## 2014-11-16 ENCOUNTER — Telehealth: Payer: Self-pay | Admitting: Gastroenterology

## 2014-11-16 NOTE — Telephone Encounter (Signed)
A user error has taken place.

## 2014-11-23 ENCOUNTER — Other Ambulatory Visit: Payer: Self-pay | Admitting: Family Medicine

## 2014-11-24 NOTE — Telephone Encounter (Signed)
Medication refilled per protocol. 

## 2015-03-10 ENCOUNTER — Other Ambulatory Visit: Payer: Medicare Other

## 2015-03-10 DIAGNOSIS — Z0000 Encounter for general adult medical examination without abnormal findings: Secondary | ICD-10-CM

## 2015-03-10 DIAGNOSIS — E038 Other specified hypothyroidism: Secondary | ICD-10-CM | POA: Diagnosis not present

## 2015-03-10 DIAGNOSIS — Z125 Encounter for screening for malignant neoplasm of prostate: Secondary | ICD-10-CM | POA: Diagnosis not present

## 2015-03-10 DIAGNOSIS — E7800 Pure hypercholesterolemia, unspecified: Secondary | ICD-10-CM

## 2015-03-10 DIAGNOSIS — I10 Essential (primary) hypertension: Secondary | ICD-10-CM

## 2015-03-10 LAB — CBC WITH DIFFERENTIAL/PLATELET
Basophils Absolute: 0.1 10*3/uL (ref 0.0–0.1)
Basophils Relative: 2 % — ABNORMAL HIGH (ref 0–1)
EOS PCT: 3 % (ref 0–5)
Eosinophils Absolute: 0.1 10*3/uL (ref 0.0–0.7)
HCT: 37.6 % — ABNORMAL LOW (ref 39.0–52.0)
Hemoglobin: 12.8 g/dL — ABNORMAL LOW (ref 13.0–17.0)
LYMPHS ABS: 1.2 10*3/uL (ref 0.7–4.0)
LYMPHS PCT: 26 % (ref 12–46)
MCH: 31.9 pg (ref 26.0–34.0)
MCHC: 34 g/dL (ref 30.0–36.0)
MCV: 93.8 fL (ref 78.0–100.0)
MPV: 10.4 fL (ref 8.6–12.4)
Monocytes Absolute: 0.5 10*3/uL (ref 0.1–1.0)
Monocytes Relative: 11 % (ref 3–12)
Neutro Abs: 2.8 10*3/uL (ref 1.7–7.7)
Neutrophils Relative %: 58 % (ref 43–77)
Platelets: 209 10*3/uL (ref 150–400)
RBC: 4.01 MIL/uL — ABNORMAL LOW (ref 4.22–5.81)
RDW: 13.1 % (ref 11.5–15.5)
WBC: 4.8 10*3/uL (ref 4.0–10.5)

## 2015-03-10 LAB — COMPLETE METABOLIC PANEL WITH GFR
ALT: 14 U/L (ref 9–46)
AST: 18 U/L (ref 10–35)
Albumin: 4.1 g/dL (ref 3.6–5.1)
Alkaline Phosphatase: 64 U/L (ref 40–115)
BUN: 16 mg/dL (ref 7–25)
CHLORIDE: 105 mmol/L (ref 98–110)
CO2: 27 mmol/L (ref 20–31)
Calcium: 9.6 mg/dL (ref 8.6–10.3)
Creat: 0.9 mg/dL (ref 0.70–1.18)
GFR, Est African American: >89 mL/min (ref >=60)
GFR, Est Non African American: 86 mL/min (ref >=60)
Glucose, Bld: 100 mg/dL — ABNORMAL HIGH (ref 70–99)
POTASSIUM: 4.2 mmol/L (ref 3.5–5.3)
Sodium: 140 mmol/L (ref 135–146)
Total Bilirubin: 0.5 mg/dL (ref 0.2–1.2)
Total Protein: 6.7 g/dL (ref 6.1–8.1)

## 2015-03-10 LAB — LIPID PANEL
Cholesterol: 151 mg/dL (ref 125–200)
HDL: 49 mg/dL (ref >=40)
LDL Cholesterol: 83 mg/dL (ref <130)
Total CHOL/HDL Ratio: 3.1 Ratio (ref <=5.0)
Triglycerides: 97 mg/dL (ref <150)
VLDL: 19 mg/dL (ref <30)

## 2015-03-10 LAB — TSH: TSH: 7.064 u[IU]/mL — ABNORMAL HIGH (ref 0.350–4.500)

## 2015-03-11 LAB — PSA, MEDICARE: PSA: 1.84 ng/mL (ref <=4.00)

## 2015-03-21 ENCOUNTER — Ambulatory Visit (INDEPENDENT_AMBULATORY_CARE_PROVIDER_SITE_OTHER): Payer: Medicare Other | Admitting: Family Medicine

## 2015-03-21 ENCOUNTER — Encounter: Payer: Self-pay | Admitting: Family Medicine

## 2015-03-21 VITALS — BP 110/68 | HR 62 | Temp 98.1°F | Resp 16 | Ht 71.0 in | Wt 215.0 lb

## 2015-03-21 DIAGNOSIS — I10 Essential (primary) hypertension: Secondary | ICD-10-CM

## 2015-03-21 DIAGNOSIS — E785 Hyperlipidemia, unspecified: Secondary | ICD-10-CM

## 2015-03-21 DIAGNOSIS — E038 Other specified hypothyroidism: Secondary | ICD-10-CM

## 2015-03-21 DIAGNOSIS — Z23 Encounter for immunization: Secondary | ICD-10-CM | POA: Diagnosis not present

## 2015-03-21 DIAGNOSIS — E039 Hypothyroidism, unspecified: Secondary | ICD-10-CM

## 2015-03-21 NOTE — Addendum Note (Signed)
Addended by: Shary Decamp B on: 03/21/2015 05:15 PM   Modules accepted: Orders

## 2015-03-21 NOTE — Progress Notes (Signed)
Subjective:    Patient ID: Stuart Gentry, male    DOB: [birth year], 69 y.o.   MRN: [id number]  HPI  07/08/14 Patient has a history of hypertension. He is currently on amlodipine, atenolol, and benazepril. He denies any chest pain shortness of breath or dyspnea on exertion. He also has a history of hyperlipidemia for which he takes atorvastatin 20 mg a day. We reduced his dose by 50% last year. He denies any myalgias or right upper quadrant pain. He is due for prostate cancer screening. He denies any urgency or hesitancy or weak urinary stream area and on his most recent fasting blood work he was found to have an elevated blood sugar of 106. He admits that he eats more carbohydrates than he should. He is not regularly exercising. His goal weight would be closer to 200 pounds and today he is 221. He also has subclinical hypothyroidism with an elevated TSH. He denies any weakness or fatigue. At the present time he is not interested in taking a medication for this and are no clinical indications for that.  AT that time,my plan was: Patient's blood pressure is excellent. I'll make no changes in his blood pressure medication at this time. His cholesterol is acceptable. Continue Lipitor 20 mg by mouth daily. I did recommend 20 pounds of weight loss and increasing aerobic exercise and decreasing carbohydrates to address his obesity as well as his hyperglycemia. Continue to monitor his TSH but at the present time there is no indication for treatment. I will also check a PSA today to screen for prostate cancer.  03/21/15 LDL has improved even though he is only on lipitor 20 mg poqday.  He denies symptoms or problems.  TSH remains slightly elevated although he is asymptomatic. His weight is stable in fact he has lost 6 pounds.  Blood pressure is excellent today at 110/68. He is due for Pneumovax 23 as he has not had this vaccination since age 80. He is also due for his annual flu shot. Lab on 03/10/2015    Component Date Value Ref Range Status  . WBC 03/10/2015 4.8  4.0 - 10.5 K/uL Final  . RBC 03/10/2015 4.01* 4.22 - 5.81 MIL/uL Final  . Hemoglobin 03/10/2015 12.8* 13.0 - 17.0 g/dL Final  . HCT 03/10/2015 37.6* 39.0 - 52.0 % Final  . MCV 03/10/2015 93.8  78.0 - 100.0 fL Final  . MCH 03/10/2015 31.9  26.0 - 34.0 pg Final  . MCHC 03/10/2015 34.0  30.0 - 36.0 g/dL Final  . RDW 03/10/2015 13.1  11.5 - 15.5 % Final  . Platelets 03/10/2015 209  150 - 400 K/uL Final  . MPV 03/10/2015 10.4  8.6 - 12.4 fL Final  . Neutrophils Relative % 03/10/2015 58  43 - 77 % Final  . Neutro Abs 03/10/2015 2.8  1.7 - 7.7 K/uL Final  . Lymphocytes Relative 03/10/2015 26  12 - 46 % Final  . Lymphs Abs 03/10/2015 1.2  0.7 - 4.0 K/uL Final  . Monocytes Relative 03/10/2015 11  3 - 12 % Final  . Monocytes Absolute 03/10/2015 0.5  0.1 - 1.0 K/uL Final  . Eosinophils Relative 03/10/2015 3  0 - 5 % Final  . Eosinophils Absolute 03/10/2015 0.1  0.0 - 0.7 K/uL Final  . Basophils Relative 03/10/2015 2* 0 - 1 % Final  . Basophils Absolute 03/10/2015 0.1  0.0 - 0.1 K/uL Final  . Smear Review 03/10/2015 Criteria for review not met   Final  .  Cholesterol 03/10/2015 151  125 - 200 mg/dL Final  . Triglycerides 03/10/2015 97  <150 mg/dL Final  . HDL 03/10/2015 49  >=40 mg/dL Final  . Total CHOL/HDL Ratio 03/10/2015 3.1  <=5.0 Ratio Final  . VLDL 03/10/2015 19  <30 mg/dL Final  . LDL Cholesterol 03/10/2015 83  <130 mg/dL Final   Comment:   Total Cholesterol/HDL Ratio:CHD Risk                        Coronary Heart Disease Risk Table                                        Men       Women          1/2 Average Risk              3.4        3.3              Average Risk              5.0        4.4           2X Average Risk              9.6        7.1           3X Average Risk             23.4       11.0 Use the calculated Patient Ratio above and the CHD Risk table  to determine the patient's CHD Risk.   Marland Kitchen PSA 03/10/2015 1.84   <=4.00 ng/mL Final   Comment: Test Methodology: ECLIA PSA (Electrochemiluminescence Immunoassay)   For PSA values from 2.5-4.0, particularly in younger men <59 years old, the AUA and NCCN suggest testing for % Free PSA (3515) and evaluation of the rate of increase in PSA (PSA velocity).   . TSH 03/10/2015 7.064* 0.350 - 4.500 uIU/mL Final  . Sodium 03/10/2015 140  135 - 146 mmol/L Final  . Potassium 03/10/2015 4.2  3.5 - 5.3 mmol/L Final  . Chloride 03/10/2015 105  98 - 110 mmol/L Final  . CO2 03/10/2015 27  20 - 31 mmol/L Final  . Glucose, Bld 03/10/2015 100* 70 - 99 mg/dL Final  . BUN 03/10/2015 16  7 - 25 mg/dL Final  . Creat 03/10/2015 0.90  0.70 - 1.18 mg/dL Final  . Total Bilirubin 03/10/2015 0.5  0.2 - 1.2 mg/dL Final  . Alkaline Phosphatase 03/10/2015 64  40 - 115 U/L Final  . AST 03/10/2015 18  10 - 35 U/L Final  . ALT 03/10/2015 14  9 - 46 U/L Final  . Total Protein 03/10/2015 6.7  6.1 - 8.1 g/dL Final  . Albumin 03/10/2015 4.1  3.6 - 5.1 g/dL Final  . Calcium 03/10/2015 9.6  8.6 - 10.3 mg/dL Final  . GFR, Est African American 03/10/2015 >89  >=60 mL/min Final  . GFR, Est Non African American 03/10/2015 86  >=60 mL/min Final   Comment:   The estimated GFR is a calculation valid for adults (>=93 years old) that uses the CKD-EPI algorithm to adjust for age and sex. It is   not to be used for children, pregnant women, hospitalized patients,    patients on dialysis, or with rapidly changing kidney  function. According to the NKDEP, eGFR >89 is normal, 60-89 shows mild impairment, 30-59 shows moderate impairment, 15-29 shows severe impairment and <15 is ESRD.      Past Medical History  Diagnosis Date  . Hypertension   . Hypothyroidism   . Hypercholesteremia   . Ulcerative colitis   . GERD (gastroesophageal reflux disease)    Past Surgical History  Procedure Laterality Date  . No prior sugery    . Colonoscopy     Current Outpatient Prescriptions on File Prior to  Visit  Medication Sig Dispense Refill  . amLODipine (NORVASC) 10 MG tablet Take 1 tablet by mouth  every day 90 tablet 3  . atenolol (TENORMIN) 25 MG tablet Take 1 tablet by mouth  daily 90 tablet 3  . atorvastatin (LIPITOR) 40 MG tablet Take 1 tablet by mouth  daily 90 tablet 1  . benazepril (LOTENSIN) 40 MG tablet Take 1 tablet by mouth  daily 90 tablet 3  . calcium carbonate (OS-CAL) 600 MG TABS Take 600 mg by mouth daily.     Marland Kitchen esomeprazole (NEXIUM) 20 MG capsule Take 20 mg by mouth daily at 12 noon.    . mesalamine (LIALDA) 1.2 G EC tablet Take 2 tablets (2.4 g total) by mouth 2 (two) times daily. (Patient taking differently: Take 2.4 g by mouth 2 (two) times daily. Every other day) 360 tablet 3  . mesalamine (ROWASA) 4 G enema Place 4 g rectally at bedtime as needed.    . mesalamine (ROWASA) 4 G enema INSERT ONE ENEMA RECTALLY AT BEDTIME 30 Bottle 0  . Multiple Vitamin (MULTIVITAMIN) tablet Take 1 tablet by mouth daily.    Marland Kitchen sulfaSALAzine (AZULFIDINE) 500 MG tablet Take 4 tablets (2,000 mg total) by mouth 3 (three) times daily. (Patient taking differently: Take 2,000 mg by mouth every other day. Pt takes 4 tablets, every other day) 360 tablet 0  . sulfaSALAzine (AZULFIDINE) 500 MG tablet Take 4 tablets (2,000 mg  total) by mouth 3 (three)  times daily. 1080 tablet 6   No current facility-administered medications on file prior to visit.   No Known Allergies Social History   Social History  . Marital Status: Married    Spouse Name: N/A  . Number of Children: 3  . Years of Education: N/A   Occupational History  . Retired    Social History Main Topics  . Smoking status: Never Smoker   . Smokeless tobacco: Never Used  . Alcohol Use: 0.6 oz/week    1 Cans of beer per week  . Drug Use: No  . Sexual Activity: Not on file   Other Topics Concern  . Not on file   Social History Narrative     Review of Systems  All other systems reviewed and are negative.      Objective:    Physical Exam  Constitutional: He appears well-developed and well-nourished.  HENT:  Mouth/Throat: Oropharynx is clear and moist.  Eyes: Conjunctivae are normal.  Neck: Neck supple. No JVD present. No thyromegaly present.  Cardiovascular: Normal rate, regular rhythm and normal heart sounds.   Pulmonary/Chest: Effort normal and breath sounds normal. No respiratory distress. He has no wheezes. He has no rales. He exhibits no tenderness.  Abdominal: Soft. Bowel sounds are normal. He exhibits no distension and no mass. There is no tenderness. There is no rebound and no guarding.  Musculoskeletal: He exhibits no edema.  Vitals reviewed.         Assessment & Plan:  HLD (hyperlipidemia)  Benign essential HTN  Subclinical hypothyroidism  Cholesterol is excellent. I would like the patient to discontinue Lipitor and recheck his cholesterol in 3 months. He continues to have subclinical hypothyroidism which is asymptomatic. I will recheck a TSH in 6 months. At present there is no cord or appreciable on exam. Blood pressure is well controlled. He received Pneumovax 23 today along with his flu shot.

## 2015-06-28 ENCOUNTER — Other Ambulatory Visit: Payer: Self-pay | Admitting: Gastroenterology

## 2015-06-28 ENCOUNTER — Other Ambulatory Visit: Payer: Self-pay

## 2015-06-28 ENCOUNTER — Telehealth: Payer: Self-pay | Admitting: Gastroenterology

## 2015-06-28 MED ORDER — MESALAMINE 4 G RE ENEM: 4.0000 g | ENEMA | Freq: Every day | RECTAL | Status: DC

## 2015-06-28 NOTE — Telephone Encounter (Signed)
Correct amount of the rowasa was sent to the pharmacy.  Pt notified

## 2015-07-27 ENCOUNTER — Other Ambulatory Visit: Payer: Self-pay | Admitting: Family Medicine

## 2015-07-27 NOTE — Telephone Encounter (Signed)
Refill appropriate and filled per protocol. 

## 2015-10-07 ENCOUNTER — Other Ambulatory Visit: Payer: Medicare Other

## 2015-10-07 DIAGNOSIS — E039 Hypothyroidism, unspecified: Secondary | ICD-10-CM | POA: Diagnosis not present

## 2015-10-07 DIAGNOSIS — E7800 Pure hypercholesterolemia, unspecified: Secondary | ICD-10-CM

## 2015-10-07 DIAGNOSIS — I10 Essential (primary) hypertension: Secondary | ICD-10-CM | POA: Diagnosis not present

## 2015-10-07 LAB — TSH: TSH: 7.71 mIU/L — ABNORMAL HIGH (ref 0.40–4.50)

## 2015-10-07 LAB — COMPLETE METABOLIC PANEL WITH GFR
ALT: 12 U/L (ref 9–46)
AST: 17 U/L (ref 10–35)
Albumin: 3.7 g/dL (ref 3.6–5.1)
Alkaline Phosphatase: 52 U/L (ref 40–115)
BILIRUBIN TOTAL: 0.4 mg/dL (ref 0.2–1.2)
BUN: 13 mg/dL (ref 7–25)
CALCIUM: 9.3 mg/dL (ref 8.6–10.3)
CHLORIDE: 107 mmol/L (ref 98–110)
CO2: 25 mmol/L (ref 20–31)
CREATININE: 0.95 mg/dL (ref 0.70–1.18)
GFR, Est Non African American: 81 mL/min (ref >=60)
Glucose, Bld: 99 mg/dL (ref 70–99)
Potassium: 5 mmol/L (ref 3.5–5.3)
Sodium: 142 mmol/L (ref 135–146)
Total Protein: 6.2 g/dL (ref 6.1–8.1)

## 2015-10-07 LAB — LIPID PANEL
CHOLESTEROL: 191 mg/dL (ref 125–200)
HDL: 44 mg/dL (ref >=40)
LDL Cholesterol: 110 mg/dL (ref <130)
TRIGLYCERIDES: 183 mg/dL — AB (ref <150)
Total CHOL/HDL Ratio: 4.3 Ratio (ref <=5.0)
VLDL: 37 mg/dL — AB (ref <30)

## 2015-10-07 LAB — CBC WITH DIFFERENTIAL/PLATELET
BASOS ABS: 37 {cells}/uL (ref 0–200)
BASOS PCT: 1 %
EOS ABS: 148 {cells}/uL (ref 15–500)
Eosinophils Relative: 4 %
HEMATOCRIT: 34.2 % — AB (ref 38.5–50.0)
Hemoglobin: 11.2 g/dL — ABNORMAL LOW (ref 13.0–17.0)
LYMPHS PCT: 24 %
Lymphs Abs: 888 cells/uL (ref 850–3900)
MCH: 31.8 pg (ref 27.0–33.0)
MCHC: 32.7 g/dL (ref 32.0–36.0)
MCV: 97.2 fL (ref 80.0–100.0)
MONO ABS: 370 {cells}/uL (ref 200–950)
MPV: 10.2 fL (ref 7.5–12.5)
Monocytes Relative: 10 %
NEUTROS PCT: 61 %
Neutro Abs: 2257 cells/uL (ref 1500–7800)
Platelets: 211 10*3/uL (ref 140–400)
RBC: 3.52 MIL/uL — ABNORMAL LOW (ref 4.20–5.80)
RDW: 13.2 % (ref 11.0–15.0)
WBC: 3.7 10*3/uL — ABNORMAL LOW (ref 3.8–10.8)

## 2015-10-10 ENCOUNTER — Encounter: Payer: Self-pay | Admitting: Family Medicine

## 2015-10-30 ENCOUNTER — Other Ambulatory Visit: Payer: Self-pay | Admitting: Gastroenterology

## 2015-10-30 ENCOUNTER — Other Ambulatory Visit: Payer: Self-pay | Admitting: Family Medicine

## 2016-01-30 ENCOUNTER — Other Ambulatory Visit: Payer: Self-pay | Admitting: Gastroenterology

## 2016-02-09 ENCOUNTER — Encounter: Payer: Self-pay | Admitting: Nurse Practitioner

## 2016-02-09 ENCOUNTER — Ambulatory Visit (INDEPENDENT_AMBULATORY_CARE_PROVIDER_SITE_OTHER): Payer: Medicare Other | Admitting: Nurse Practitioner

## 2016-02-09 VITALS — BP 128/78 | HR 78 | Ht 70.0 in | Wt 202.6 lb

## 2016-02-09 DIAGNOSIS — K5100 Ulcerative (chronic) pancolitis without complications: Secondary | ICD-10-CM

## 2016-02-09 NOTE — Patient Instructions (Signed)
We have provided Reflux brochures to you.  Continue the Rowasa Enemas. Please call Nilani Hugill at [phone] if you need refills on the Rowasa enemas.  You may try the over the counter Zantac or Pepcid AC.

## 2016-02-13 NOTE — Progress Notes (Signed)
I agree with the above note, plan 

## 2016-02-13 NOTE — Progress Notes (Signed)
     HPI: This is a 70 year old male followed by Dr. [name] for longstanding UC. Colonoscopy 05/2013 by Dr. [name] terminal ileum was normal. There was clear, moderate inflammation from anus to about 60cm proximal (very indistinct transition to normal mucosa). The right colon was essentially normal appearing. Biopsies showed chronic and active inflammation in left colon; right colon was normal. Patient maintained on Azulfidine and prn Rowasa. He used Rowasa in September but towards the end of prescription he didn't use them daily. Symptoms did improve but he relapsed a few days later with multiple, small volume stools with urgency. No recent antibiotics. Feeling better again back on Rowasa. No bleeding or significant abdominal pain   Past Medical History:  Diagnosis Date  . GERD (gastroesophageal reflux disease)   . Hypercholesteremia   . Hypertension   . Hypothyroidism   . Ulcerative colitis     Patient's surgical history, family medical history, social history, and allergies were all reviewed in Epic    Physical Exam: BP 128/78   Pulse 78   Ht 5\' 10"  (1.778 m)   Wt 202 lb 9.6 oz (91.9 kg)   SpO2 98%   BMI 29.07 kg/m   GENERAL:pleasant white male, well developed in NAD PSYCH: :Pleasant, cooperative, normal affect HEENT: Normocephalic, conjunctiva pink, mucous membranes moist, neck supple without masses CARDIAC:  RRR, no murmur heard, no peripheral edema PULM: Normal respiratory effort, lungs CTA bilaterally, no wheezing ABDOMEN:  soft, nontender, nondistended, no obvious masses, no hepatomegaly,  normal bowel sounds SKIN:  turgor, no lesions seen Musculoskeletal:  Normal muscle tone, good strength NEURO: Alert and oriented x 3, no focal neurologic deficits  ASSESSMENT and PLAN:   70 year old male with longstanding. UC maintained on rowasa enemas and Azulfidine. Recent flare, improved with Rowasa but then relapsed 1.5 weeks later.. Improving again on Rowasa. Lialda was too  expensive but seems to tolerate the Azulfidine okay. He questions if okay to use daily Rowasa enemas. Explained that Rowasa is topical anti-inflamatory, like Mesalamine and safer than long term steroid enemas. Having said that, some patients may not want to be on daily enemas long term. If next tier of medication is needed then patient would need to have discussion with his primary GI, Dr. [name]. Hopefully he will continue to benefit oral and topical mesalamine and Rowasa enemas for a while longer. Follow up with Dr. [name] in 3 months or sooner if recurrent problems.  Will refill Rowasa enemas per his request.

## 2016-02-15 DIAGNOSIS — Z23 Encounter for immunization: Secondary | ICD-10-CM | POA: Diagnosis not present

## 2016-04-06 ENCOUNTER — Other Ambulatory Visit: Payer: Self-pay

## 2016-04-16 DIAGNOSIS — D225 Melanocytic nevi of trunk: Secondary | ICD-10-CM | POA: Diagnosis not present

## 2016-04-16 DIAGNOSIS — L821 Other seborrheic keratosis: Secondary | ICD-10-CM | POA: Diagnosis not present

## 2016-04-16 DIAGNOSIS — D485 Neoplasm of uncertain behavior of skin: Secondary | ICD-10-CM | POA: Diagnosis not present

## 2016-04-16 DIAGNOSIS — L812 Freckles: Secondary | ICD-10-CM | POA: Diagnosis not present

## 2016-04-16 DIAGNOSIS — L820 Inflamed seborrheic keratosis: Secondary | ICD-10-CM | POA: Diagnosis not present

## 2016-04-16 DIAGNOSIS — D2272 Melanocytic nevi of left lower limb, including hip: Secondary | ICD-10-CM | POA: Diagnosis not present

## 2016-04-16 DIAGNOSIS — D1801 Hemangioma of skin and subcutaneous tissue: Secondary | ICD-10-CM | POA: Diagnosis not present

## 2016-04-30 HISTORY — PX: SKIN TAG REMOVAL: SHX780

## 2016-07-24 ENCOUNTER — Other Ambulatory Visit: Payer: Self-pay | Admitting: Gastroenterology

## 2016-09-11 ENCOUNTER — Other Ambulatory Visit (INDEPENDENT_AMBULATORY_CARE_PROVIDER_SITE_OTHER): Payer: Medicare Other

## 2016-09-11 ENCOUNTER — Other Ambulatory Visit: Payer: Self-pay | Admitting: Gastroenterology

## 2016-09-11 ENCOUNTER — Inpatient Hospital Stay: Admission: RE | Admit: 2016-09-11 | Payer: Medicare Other | Source: Ambulatory Visit

## 2016-09-11 ENCOUNTER — Ambulatory Visit (INDEPENDENT_AMBULATORY_CARE_PROVIDER_SITE_OTHER): Payer: Medicare Other | Admitting: Physician Assistant

## 2016-09-11 ENCOUNTER — Encounter: Payer: Self-pay | Admitting: Physician Assistant

## 2016-09-11 VITALS — BP 128/78 | HR 64 | Ht 70.0 in | Wt 204.8 lb

## 2016-09-11 DIAGNOSIS — R112 Nausea with vomiting, unspecified: Secondary | ICD-10-CM | POA: Diagnosis not present

## 2016-09-11 DIAGNOSIS — K5100 Ulcerative (chronic) pancolitis without complications: Secondary | ICD-10-CM | POA: Diagnosis not present

## 2016-09-11 DIAGNOSIS — R109 Unspecified abdominal pain: Secondary | ICD-10-CM

## 2016-09-11 DIAGNOSIS — R61 Generalized hyperhidrosis: Secondary | ICD-10-CM

## 2016-09-11 LAB — COMPREHENSIVE METABOLIC PANEL
ALBUMIN: 4.3 g/dL (ref 3.5–5.2)
ALK PHOS: 59 U/L (ref 39–117)
ALT: 11 U/L (ref 0–53)
AST: 15 U/L (ref 0–37)
BUN: 24 mg/dL — AB (ref 6–23)
CO2: 28 mEq/L (ref 19–32)
CREATININE: 1.18 mg/dL (ref 0.40–1.50)
Calcium: 9.6 mg/dL (ref 8.4–10.5)
Chloride: 106 mEq/L (ref 96–112)
GFR: 64.53 mL/min (ref >60.00)
Glucose, Bld: 139 mg/dL — ABNORMAL HIGH (ref 70–99)
POTASSIUM: 4.1 meq/L (ref 3.5–5.1)
SODIUM: 138 meq/L (ref 135–145)
TOTAL PROTEIN: 7.3 g/dL (ref 6.0–8.3)
Total Bilirubin: 0.5 mg/dL (ref 0.2–1.2)

## 2016-09-11 LAB — CBC WITH DIFFERENTIAL/PLATELET
BASOS PCT: 0.7 % (ref 0.0–3.0)
Basophils Absolute: 0.1 10*3/uL (ref 0.0–0.1)
EOS PCT: 0.9 % (ref 0.0–5.0)
Eosinophils Absolute: 0.1 10*3/uL (ref 0.0–0.7)
HCT: 38.6 % — ABNORMAL LOW (ref 39.0–52.0)
Hemoglobin: 13 g/dL (ref 13.0–17.0)
LYMPHS ABS: 1.3 10*3/uL (ref 0.7–4.0)
Lymphocytes Relative: 16.6 % (ref 12.0–46.0)
MCHC: 33.6 g/dL (ref 30.0–36.0)
MCV: 95.6 fl (ref 78.0–100.0)
MONOS PCT: 8.4 % (ref 3.0–12.0)
Monocytes Absolute: 0.7 10*3/uL (ref 0.1–1.0)
Neutro Abs: 6 10*3/uL (ref 1.4–7.7)
Neutrophils Relative %: 73.4 % (ref 43.0–77.0)
Platelets: 252 10*3/uL (ref 150.0–400.0)
RBC: 4.04 Mil/uL — AB (ref 4.22–5.81)
RDW: 13 % (ref 11.5–15.5)
WBC: 8.1 10*3/uL (ref 4.0–10.5)

## 2016-09-11 LAB — C-REACTIVE PROTEIN: CRP: 0.6 mg/dL (ref 0.5–20.0)

## 2016-09-11 LAB — SEDIMENTATION RATE: Sed Rate: 7 mm/hr (ref 0–20)

## 2016-09-11 LAB — LIPASE: Lipase: 24 U/L (ref 11.0–59.0)

## 2016-09-11 MED ORDER — MESALAMINE 4 G RE ENEM: 4.0000 g | ENEMA | Freq: Every day | RECTAL | 1 refills | Status: DC

## 2016-09-11 MED ORDER — NA SULFATE-K SULFATE-MG SULF 17.5-3.13-1.6 GM/177ML PO SOLN: ORAL | 0 refills | Status: DC

## 2016-09-11 NOTE — Patient Instructions (Addendum)
Your physician has requested that you go to the basement for the following lab work before leaving today: CBC, CMET, Lipase, Sed Rate, CRP  Please continue Azulfadine 500 mg 4 tablets three times daily.  We have sent the following medications to your pharmacy for you to pick up at your convenience: Rowasa Enemas- 1 enema nightly  ___________________________________________________________________ You have been scheduled for a CT scan of the abdomen and pelvis at [facility] (1st floor of hospital) .   You are scheduled on Wednesday, 09/14/16 at 8:30 am. You should arrive at 8:15 am for registration. Please follow the written instructions below on the day of your exam:  WARNING: IF YOU ARE ALLERGIC TO IODINE/X-RAY DYE, PLEASE NOTIFY RADIOLOGY IMMEDIATELY AT [phone]! YOU WILL BE GIVEN A 13 HOUR PREMEDICATION PREP.  1) Do not eat or drink anything after 4:30 am (4 hours prior to your test) 2) You have been given 2 bottles of oral contrast to drink. The solution may taste               better if refrigerated, but do NOT add ice or any other liquid to this solution. Shake             well before drinking.    Drink 1 bottle of contrast @ 6:30 am (2 hours prior to your exam)  Drink 1 bottle of contrast @ 7:30 am (1 hour prior to your exam)  You may take any medications as prescribed with a small amount of water except for the following: Metformin, Glucophage, Glucovance, Avandamet, Riomet, Fortamet, Actoplus Met, Janumet, Glumetza or Metaglip. The above medications must be held the day of the exam AND 48 hours after the exam.  The purpose of you drinking the oral contrast is to aid in the visualization of your intestinal tract. The contrast solution may cause some diarrhea. Before your exam is started, you will be given a small amount of fluid to drink. Depending on your individual set of symptoms, you may also receive an intravenous injection of x-ray contrast/dye. Plan on being at  [facility] for 30 minutes or longer, depending on the type of exam you are having performed.  This test typically takes 30-45 minutes to complete.  If you have any questions regarding your exam or if you need to reschedule, you may call the CT department at [phone] between the hours of 8:00 am and 5:00 pm, Monday-Friday. ______________________________________________________________________ If you are age 72 or older, your body mass index should be between 23-30. Your Body mass index is 29.39 kg/m. If this is out of the aforementioned range listed, please consider follow up with your Primary Care Provider.  If you are age 72 or younger, your body mass index should be between 19-25. Your Body mass index is 29.39 kg/m. If this is out of the aformentioned range listed, please consider follow up with your Primary Care Provider.

## 2016-09-11 NOTE — Progress Notes (Signed)
I agree with the above note, plan 

## 2016-09-11 NOTE — Progress Notes (Signed)
Subjective:    Patient ID: Stuart Gentry, male    DOB: [birth year], 71 y.o.   MRN: [id number]  HPI Cordaro is a pleasant 71 year old white male, known to Dr. [name] with long-standing ulcerative colitis initially diagnosed in the 43s. He has had evidence of pancolitis in the past but last couple of colonoscopies have shown left-sided involvement only. Last colonoscopy was done in February 2015 showing moderate inflammation from the anus to 60 cm, the right colon appeared normal. Biopsies showed chronic active inflammation in the left colon, and normal mucosa in the right colon. He has been maintained on Azulfidine 2 g 3 times daily over the past several years and has tolerated this well. He has used Rowasa enemas on a fairly regular basis, sometimes daily and sometimes every other day but feels he does best if he stays on these. Patient said he had been stable over the past 6 months since last seen in the office and had been using the Rowasa enemas almost every night. He says without regimen he had no symptoms. Generally when his colitis flares he starts having urgency and looser stools and mucus. He rarely has had much bleeding. Comes in today because of an acute episode that occurred last evening. He developed very sharp mid abdominal pain which doubled him over, was associated with profuse diaphoresis, nausea vomiting and bowel movements which were formed to loose. He says he had a couple of trips back and forth to the bathroom him a did not see any blood.'s of breath and his wife called EMS. He was evaluated, found to be stable and whole episode lasted about 30 minutes and resolved and has not recurred. He says he still sore in his abdomen today and is had to small diarrheal stools today no vomiting. No fever chills or sweats. He denies any urinary symptoms. Patient mentions that he does have an umbilical hernia but that it has not ever been symptomatic. He has not had any previous abdominal  surgeries.  Review of Systems Pertinent positive and negative review of systems were noted in the above HPI section.  All other review of systems was otherwise negative.  Outpatient Encounter Prescriptions as of 09/11/2016  Medication Sig  . amLODipine (NORVASC) 10 MG tablet Take 1 tablet by mouth  every day  . atenolol (TENORMIN) 25 MG tablet Take 1 tablet by mouth  daily  . benazepril (LOTENSIN) 40 MG tablet Take 1 tablet by mouth  daily  . calcium carbonate (OS-CAL) 600 MG TABS Take 600 mg by mouth daily.   Marland Kitchen esomeprazole (NEXIUM) 20 MG capsule Take 20 mg by mouth daily at 12 noon.  . Multiple Vitamin (MULTIVITAMIN) tablet Take 1 tablet by mouth daily.  Marland Kitchen sulfaSALAzine (AZULFIDINE) 500 MG tablet TAKE 4 TABLETS BY MOUTH 3  TIMES DAILY  . mesalamine (ROWASA) 4 g enema Place 60 mLs (4 g total) rectally at bedtime. Place 60 mls rectally at bedtime  . [DISCONTINUED] mesalamine (ROWASA) 4 g enema Place 60 mls rectally at bedtime (Patient not taking: Reported on 09/11/2016)  . [DISCONTINUED] Na Sulfate-K Sulfate-Mg Sulf 17.5-3.13-1.6 GM/180ML SOLN Suprep-Use as directed   No facility-administered encounter medications on file as of 09/11/2016.    No Known Allergies Patient Active Problem List   Diagnosis Date Noted  . Hypertension   . Ulcerative colitis   . Hypercholesteremia   . Hypothyroidism    Social History   Social History  . Marital status: Married    Spouse name: N/A  .  Number of children: 3  . Years of education: N/A   Occupational History  . Retired    Social History Main Topics  . Smoking status: Never Smoker  . Smokeless tobacco: Never Used  . Alcohol use Yes     Comment: 1-2 cans beer monthly  . Drug use: No  . Sexual activity: Not on file   Other Topics Concern  . Not on file   Social History Narrative  . No narrative on file    Mr. Adamczak family history includes Heart disease in his brother; Liver cancer in his mother.      Objective:    Vitals:    09/11/16 1110  BP: 128/78  Pulse: 64    Physical Exam  well-developed older white male in no acute distress, accompanied by his wife blood pressure 128/78 pulse 64, BMI 29.3. HEENT; nontraumatic normocephalic EOMI PERRLA sclera anicteric, Cardiovascular; regular rate and rhythm with S1-S2 no murmur or gallop, Pulmonary; clear bilaterally, Abdomen; soft, bowel sounds are present, he has some tenderness with deep palpation in the lower mid abdomen and bilateral lower quadrants there is no guarding or rebound no palpable mass or hepatosplenomegaly, no bruit, Rectal ;exam not done, Extremities; no clubbing cyanosis or edema skin warm and dry, Neuropsych; mood and affect appropriate       Assessment & Plan:   #72 71 year old white male with long-standing ulcerative colitis, left-sided over the past several years with evidence of pancolitis or remotely. Patient has had good control of symptoms on 6 g of Azulfidine daily and nightly Rowasa enemas. #2 acute episode of severe mid abdominal pain which occurred last evening, doubled him over was associated with profuse diaphoresis, nausea vomiting and several bowel movements some of which were diarrheal, no bleeding. Episode lasted about 30 minutes. He is tender today but has not had any further severe pain, nausea vomiting or diaphoresis. Etiology of acute episode is not clear, rule out infectious i.e. food borne, rule out transient obstructive episode i.e. umbilical hernia or other. Rule out ischemic event.  #3 hypertension #4 hypothyroidism  Plan; CBC with differential, CMET , lipase, CRP We'll schedule for CT of the abdomen and pelvis with contrast to be done within the next 24 hours. Push fluids and advised to eat very light bland meals Patient advised to seek care in the emergency room should he have a recurrent episode. Continue Azulfidine 500 mg 4 by mouth 3 times a day Refill Rowasa enemas for daily at bedtime use Further recommendations  pending results of above. Patient is also due for follow-up colonoscopy with Dr. [name]. We will schedule that once the acute symptoms have been sorted out.  Bane Hagy Genia Harold PA-C 09/11/2016   Cc: [name], MD

## 2016-09-12 ENCOUNTER — Encounter (HOSPITAL_COMMUNITY): Payer: Self-pay

## 2016-09-12 ENCOUNTER — Ambulatory Visit (HOSPITAL_COMMUNITY)
Admission: RE | Admit: 2016-09-12 | Discharge: 2016-09-12 | Disposition: A | Discharge status: Home / Self Care | Payer: Medicare Other | Source: Ambulatory Visit | Attending: Physician Assistant | Admitting: Physician Assistant

## 2016-09-12 DIAGNOSIS — D71 Functional disorders of polymorphonuclear neutrophils: Secondary | ICD-10-CM | POA: Diagnosis not present

## 2016-09-12 DIAGNOSIS — K429 Umbilical hernia without obstruction or gangrene: Secondary | ICD-10-CM | POA: Diagnosis not present

## 2016-09-12 DIAGNOSIS — R109 Unspecified abdominal pain: Secondary | ICD-10-CM | POA: Diagnosis not present

## 2016-09-12 DIAGNOSIS — M5136 Other intervertebral disc degeneration, lumbar region: Secondary | ICD-10-CM | POA: Insufficient documentation

## 2016-09-12 DIAGNOSIS — R112 Nausea with vomiting, unspecified: Secondary | ICD-10-CM | POA: Insufficient documentation

## 2016-09-12 DIAGNOSIS — R197 Diarrhea, unspecified: Secondary | ICD-10-CM | POA: Diagnosis not present

## 2016-09-12 DIAGNOSIS — N400 Enlarged prostate without lower urinary tract symptoms: Secondary | ICD-10-CM | POA: Diagnosis not present

## 2016-09-12 DIAGNOSIS — I700 Atherosclerosis of aorta: Secondary | ICD-10-CM | POA: Diagnosis not present

## 2016-09-12 DIAGNOSIS — I2510 Atherosclerotic heart disease of native coronary artery without angina pectoris: Secondary | ICD-10-CM | POA: Insufficient documentation

## 2016-09-12 DIAGNOSIS — M47896 Other spondylosis, lumbar region: Secondary | ICD-10-CM | POA: Diagnosis not present

## 2016-09-12 DIAGNOSIS — R61 Generalized hyperhidrosis: Secondary | ICD-10-CM | POA: Insufficient documentation

## 2016-09-12 DIAGNOSIS — K5100 Ulcerative (chronic) pancolitis without complications: Secondary | ICD-10-CM | POA: Insufficient documentation

## 2016-09-12 MED ORDER — IOPAMIDOL (ISOVUE-300) INJECTION 61%
INTRAVENOUS | Status: AC
  Filled 2016-09-12: qty 100

## 2016-09-12 MED ORDER — IOPAMIDOL (ISOVUE-300) INJECTION 61%
100.0000 mL | Freq: Once | INTRAVENOUS | Status: AC | PRN
  Administered 2016-09-12: 100 mL via INTRAVENOUS

## 2016-09-13 ENCOUNTER — Ambulatory Visit: Payer: Medicare Other | Admitting: Physician Assistant

## 2016-09-13 ENCOUNTER — Encounter: Payer: Self-pay | Admitting: Family Medicine

## 2016-09-13 DIAGNOSIS — I2510 Atherosclerotic heart disease of native coronary artery without angina pectoris: Secondary | ICD-10-CM | POA: Insufficient documentation

## 2016-09-25 ENCOUNTER — Encounter: Payer: Self-pay | Admitting: Gastroenterology

## 2016-10-26 ENCOUNTER — Ambulatory Visit (AMBULATORY_SURGERY_CENTER): Payer: Self-pay | Admitting: *Deleted

## 2016-10-26 VITALS — Ht 71.0 in | Wt 227.6 lb

## 2016-10-26 DIAGNOSIS — K5100 Ulcerative (chronic) pancolitis without complications: Secondary | ICD-10-CM

## 2016-10-26 MED ORDER — NA SULFATE-K SULFATE-MG SULF 17.5-3.13-1.6 GM/177ML PO SOLN: ORAL | 0 refills | Status: DC

## 2016-10-26 NOTE — Progress Notes (Signed)
Pt denies allergies to eggs or soy products. Denies difficulty with sedation or anesthesia. Denies any diet or weight loss medications. Denies use of supplemental oxygen.  Denied. Patient has had several colonoscopies

## 2016-11-01 ENCOUNTER — Other Ambulatory Visit: Payer: Self-pay | Admitting: Family Medicine

## 2016-11-02 ENCOUNTER — Telehealth: Payer: Self-pay | Admitting: Family Medicine

## 2016-11-02 MED ORDER — AMLODIPINE BESYLATE 10 MG PO TABS
10.0000 mg | ORAL_TABLET | Freq: Every day | ORAL | 0 refills | Status: DC
Start: 2016-11-02 — End: 2017-02-06

## 2016-11-02 MED ORDER — BENAZEPRIL HCL 40 MG PO TABS: 40.0000 mg | ORAL_TABLET | Freq: Every day | ORAL | 0 refills | Status: DC

## 2016-11-02 MED ORDER — ATENOLOL 25 MG PO TABS: 25.0000 mg | ORAL_TABLET | Freq: Every day | ORAL | 0 refills | Status: DC

## 2016-11-02 NOTE — Telephone Encounter (Signed)
Pt needing refill on med (atenolol, benazepril, and amlodipine) sent to mail order pharmacy. We recently got a request for these and denied due to him needing ov. Pt is scheduled for ov on 11/05/2016 but needs them sent in now because he will be going on vacation and wont get them in time if we do not.

## 2016-11-02 NOTE — Telephone Encounter (Signed)
Medication called/sent to requested pharmacy  

## 2016-11-05 ENCOUNTER — Encounter: Payer: Self-pay | Admitting: Family Medicine

## 2016-11-05 ENCOUNTER — Ambulatory Visit (INDEPENDENT_AMBULATORY_CARE_PROVIDER_SITE_OTHER): Payer: Medicare Other | Admitting: Family Medicine

## 2016-11-05 VITALS — BP 124/68 | HR 62 | Temp 98.1°F | Resp 12 | Ht 71.0 in | Wt 208.0 lb

## 2016-11-05 DIAGNOSIS — I10 Essential (primary) hypertension: Secondary | ICD-10-CM | POA: Diagnosis not present

## 2016-11-05 DIAGNOSIS — Z125 Encounter for screening for malignant neoplasm of prostate: Secondary | ICD-10-CM

## 2016-11-05 DIAGNOSIS — E038 Other specified hypothyroidism: Secondary | ICD-10-CM

## 2016-11-05 DIAGNOSIS — E039 Hypothyroidism, unspecified: Secondary | ICD-10-CM

## 2016-11-05 DIAGNOSIS — E7800 Pure hypercholesterolemia, unspecified: Secondary | ICD-10-CM | POA: Diagnosis not present

## 2016-11-05 NOTE — Progress Notes (Signed)
Subjective:    Patient ID: Stuart Gentry, male    DOB: 1945/09/18, 71 y.o.   MRN: [id number]  HPI Patient is here today for follow-up of his blood pressure. He denies any chest pain shortness of breath or dyspnea on exertion. His blood pressure today is well controlled on his current medications. He is due for fasting lipid panel. He is also due for a PSA to screen for prostate cancer. In May, he was hospitalized for an ulcerative colitis flare. At that time, they checked a CBC as well as a CMP which were within normal limits. His sugar was high but was not a fasting sample. He is also due to recheck a TSH for his subclinical hypothyroidism. He denies any recent weight gain, fatigue, or cold intolerance. Immunizations are up-to-date. He declines a tetanus shot. He is due for hepatitis C screening but he politely declines this. Past Medical History:  Diagnosis Date  . Chronic kidney disease    kidney stones?  . Coronary artery calcification seen on CAT scan   . GERD (gastroesophageal reflux disease)   . Hypercholesteremia   . Hypertension   . Hypothyroidism    no meds  . Sleep apnea    never been tested  . Ulcerative colitis    Past Surgical History:  Procedure Laterality Date  . COLONOSCOPY    . SKIN TAG REMOVAL Right 2018   underarm   Current Outpatient Prescriptions on File Prior to Visit  Medication Sig Dispense Refill  . amLODipine (NORVASC) 10 MG tablet Take 1 tablet (10 mg total) by mouth daily. 90 tablet 0  . atenolol (TENORMIN) 25 MG tablet Take 1 tablet (25 mg total) by mouth daily. 90 tablet 0  . benazepril (LOTENSIN) 40 MG tablet Take 1 tablet (40 mg total) by mouth daily. 90 tablet 0  . calcium carbonate (OS-CAL) 600 MG TABS Take 600 mg by mouth daily.     Marland Kitchen esomeprazole (NEXIUM) 20 MG capsule Take 20 mg by mouth daily at 12 noon.    . mesalamine (ROWASA) 4 g enema Place 60 mLs (4 g total) rectally at bedtime. Place 60 mls rectally at bedtime 90 Bottle 1  .  Multiple Vitamin (MULTIVITAMIN) tablet Take 1 tablet by mouth daily.    Marland Kitchen sulfaSALAzine (AZULFIDINE) 500 MG tablet TAKE 4 TABLETS BY MOUTH 3  TIMES DAILY 1080 tablet 6   No current facility-administered medications on file prior to visit.    No Known Allergies Social History   Social History  . Marital status: Married    Spouse name: N/A  . Number of children: 3  . Years of education: N/A   Occupational History  . Retired    Social History Main Topics  . Smoking status: Never Smoker  . Smokeless tobacco: Never Used  . Alcohol use Yes     Comment: 1-2 cans beer monthly  . Drug use: No  . Sexual activity: Not on file   Other Topics Concern  . Not on file   Social History Narrative  . No narrative on file      Review of Systems  All other systems reviewed and are negative.      Objective:   Physical Exam  Constitutional: He appears well-developed and well-nourished.  Neck: No JVD present. No thyromegaly present.  Cardiovascular: Normal rate, regular rhythm and normal heart sounds.   No murmur heard. Pulmonary/Chest: Effort normal and breath sounds normal. No respiratory distress. He has no wheezes. He has  no rales.  Abdominal: Soft. Bowel sounds are normal. He exhibits no distension. There is no tenderness. There is no rebound.  Musculoskeletal: He exhibits no edema.  Lymphadenopathy:    He has no cervical adenopathy.  Vitals reviewed.         Assessment & Plan:  Pure hypercholesterolemia - Plan: Lipid panel, CANCELED: COMPLETE METABOLIC PANEL WITH GFR, CANCELED: CBC with Differential/Platelet  Benign essential HTN - Plan: Lipid panel, CANCELED: COMPLETE METABOLIC PANEL WITH GFR, CANCELED: CBC with Differential/Platelet  Subclinical hypothyroidism - Plan: TSH  Prostate cancer screening - Plan: PSA  Patient's blood pressure is outstanding. I would like him to return fasting for fasting lipid panel. His goal LDL cholesterol is less than 130. He is due to  recheck a TSH. However he is asymptomatic from his subclinical hypothyroidism. He is also due to recheck a PSA for prostate cancer screening.  The remainder of his preventative care is up-to-date.

## 2016-11-07 ENCOUNTER — Other Ambulatory Visit: Payer: Medicare Other

## 2016-11-07 DIAGNOSIS — I10 Essential (primary) hypertension: Secondary | ICD-10-CM | POA: Diagnosis not present

## 2016-11-07 DIAGNOSIS — E039 Hypothyroidism, unspecified: Secondary | ICD-10-CM | POA: Diagnosis not present

## 2016-11-07 DIAGNOSIS — Z125 Encounter for screening for malignant neoplasm of prostate: Secondary | ICD-10-CM | POA: Diagnosis not present

## 2016-11-07 DIAGNOSIS — E7800 Pure hypercholesterolemia, unspecified: Secondary | ICD-10-CM | POA: Diagnosis not present

## 2016-11-07 LAB — TSH: TSH: 6.75 m[IU]/L — AB (ref 0.40–4.50)

## 2016-11-07 LAB — LIPID PANEL
CHOL/HDL RATIO: 4.6 ratio (ref <5.0)
CHOLESTEROL: 229 mg/dL — AB (ref <200)
HDL: 50 mg/dL (ref >40)
LDL Cholesterol: 159 mg/dL — ABNORMAL HIGH (ref <100)
Triglycerides: 101 mg/dL (ref <150)
VLDL: 20 mg/dL (ref <30)

## 2016-11-08 LAB — PSA: PSA: 1.5 ng/mL (ref <=4.0)

## 2016-11-27 ENCOUNTER — Ambulatory Visit (AMBULATORY_SURGERY_CENTER): Payer: Medicare Other | Admitting: Gastroenterology

## 2016-11-27 ENCOUNTER — Encounter: Payer: Self-pay | Admitting: Gastroenterology

## 2016-11-27 VITALS — BP 154/78 | HR 54 | Temp 98.2°F | Resp 23 | Ht 71.0 in | Wt 227.0 lb

## 2016-11-27 DIAGNOSIS — K5150 Left sided colitis without complications: Secondary | ICD-10-CM | POA: Diagnosis not present

## 2016-11-27 DIAGNOSIS — K5100 Ulcerative (chronic) pancolitis without complications: Secondary | ICD-10-CM | POA: Diagnosis not present

## 2016-11-27 DIAGNOSIS — Z1211 Encounter for screening for malignant neoplasm of colon: Secondary | ICD-10-CM | POA: Diagnosis not present

## 2016-11-27 DIAGNOSIS — K529 Noninfective gastroenteritis and colitis, unspecified: Secondary | ICD-10-CM | POA: Diagnosis not present

## 2016-11-27 MED ORDER — SODIUM CHLORIDE 0.9 % IV SOLN: 500.0000 mL | INTRAVENOUS | Status: DC

## 2016-11-27 NOTE — Patient Instructions (Signed)
YOU HAD AN ENDOSCOPIC PROCEDURE TODAY AT THE [facility]:   Refer to the procedure report that was given to you for any specific questions about what was found during the examination.  If the procedure report does not answer your questions, please call your gastroenterologist to clarify.  If you requested that your care partner not be given the details of your procedure findings, then the procedure report has been included in a sealed envelope for you to review at your convenience later.  YOU SHOULD EXPECT: Some feelings of bloating in the abdomen. Passage of more gas than usual.  Walking can help get rid of the air that was put into your GI tract during the procedure and reduce the bloating. If you had a lower endoscopy (such as a colonoscopy or flexible sigmoidoscopy) you may notice spotting of blood in your stool or on the toilet paper. If you underwent a bowel prep for your procedure, you may not have a normal bowel movement for a few days.  Please Note:  You might notice some irritation and congestion in your nose or some drainage.  This is from the oxygen used during your procedure.  There is no need for concern and it should clear up in a day or so.  SYMPTOMS TO REPORT IMMEDIATELY:   Following lower endoscopy (colonoscopy or flexible sigmoidoscopy):  Excessive amounts of blood in the stool  Significant tenderness or worsening of abdominal pains  Swelling of the abdomen that is new, acute  Fever of 100F or higher   For urgent or emergent issues, a gastroenterologist can be reached at any hour by calling [phone].   DIET:  We do recommend a small meal at first, but then you may proceed to your regular diet.  Drink plenty of fluids but you should avoid alcoholic beverages for 24 hours.  ACTIVITY:  You should plan to take it easy for the rest of today and you should NOT DRIVE or use heavy machinery until tomorrow (because of the sedation medicines used during the test).     FOLLOW UP: Our staff will call the number listed on your records the next business day following your procedure to check on you and address any questions or concerns that you may have regarding the information given to you following your procedure. If we do not reach you, we will leave a message.  However, if you are feeling well and you are not experiencing any problems, there is no need to return our call.  We will assume that you have returned to your regular daily activities without incident.  If any biopsies were taken you will be contacted by phone or by letter within the next 1-3 weeks.  Please call us at [phone] if you have not heard about the biopsies in 3 weeks.    SIGNATURES/CONFIDENTIALITY: You and/or your care partner have signed paperwork which will be entered into your electronic medical record.  These signatures attest to the fact that that the information above on your After Visit Summary has been reviewed and is understood.  Full responsibility of the confidentiality of this discharge information lies with you and/or your care-partner.  Thank you for letting us take care of your healthcare needs today. 

## 2016-11-27 NOTE — Op Note (Signed)
[place] Patient Name: Stuart Gentry Procedure Date: 11/27/2016 8:24 AM MRN: [id number] Endoscopist: Milus Banister , MD Age: 71 Referring MD:  Date of Birth: 12-15-1945 Gender: Male Account #: [id number] Procedure:                Colonoscopy Indications:              Personal history of ulcerative colitis Medicines:                Monitored Anesthesia Care Procedure:                Pre-Anesthesia Assessment:                           - Prior to the procedure, a History and Physical                            was performed, and patient medications and                            allergies were reviewed. The patient's tolerance of                            previous anesthesia was also reviewed. The risks                            and benefits of the procedure and the sedation                            options and risks were discussed with the patient.                            All questions were answered, and informed consent                            was obtained. Prior Anticoagulants: The patient has                            taken no previous anticoagulant or antiplatelet                            agents. ASA Grade Assessment: II - A patient with                            mild systemic disease. After reviewing the risks                            and benefits, the patient was deemed in                            satisfactory condition to undergo the procedure.                           After obtaining informed consent, the colonoscope  was passed under direct vision. Throughout the                            procedure, the patient's blood pressure, pulse, and                            oxygen saturations were monitored continuously. The                            Colonoscope was introduced through the anus and                            advanced to the the terminal ileum. The colonoscopy                            was performed without  difficulty. The patient                            tolerated the procedure well. The quality of the                            bowel preparation was good. The terminal ileum,                            ileocecal valve, appendiceal orifice, and rectum                            were photographed. Scope In: 8:34:50 AM Scope Out: 8:50:50 AM Scope Withdrawal Time: 0 hours 8 minutes 50 seconds  Total Procedure Duration: 0 hours 16 minutes 0 seconds  Findings:                 The terminal ileum appeared normal.                           Mild inflammation was found from the rectum to                            about 30cm (granular, ertyhematous appearing).                            There was NOT a distinct transition to normal                            mucoca. There was also a focal area in the right                            colon (around the hepatic flexure) that was mildly                            inflammed appearing. Biopsies were taken with a                            cold forceps for histology (from right and left  colon segments) to check for dysplasia.                           The exam was otherwise without abnormality on                            direct and retroflexion views. Complications:            No immediate complications. Estimated blood loss:                            None. Estimated Blood Loss:     Estimated blood loss: none. Impression:               - The terminal ileum was normal.                           - Very mild left sided inflammation and also a                            focal area of mild inflammation around the hepatic                            flexure. Overall improved compared to examination                            2015.                           - The examination was otherwise normal on direct                            and retroflexion views. Recommendation:           - Patient has a contact number available for                             emergencies. The signs and symptoms of potential                            delayed complications were discussed with the                            patient. Return to normal activities tomorrow.                            Written discharge instructions were provided to the                            patient.                           - Resume previous diet.                           - Continue present medications.                           -  Repeat colonoscopy is recommended. The                            colonoscopy date will be determined after pathology                            results from today's exam become available for                            review (likely 2-3 years repeat examination). Milus Banister, MD 11/27/2016 8:57:54 AM This report has been signed electronically.

## 2016-11-27 NOTE — Progress Notes (Signed)
Pt's states no medical or surgical changes since previsit or office visit. 

## 2016-11-27 NOTE — Progress Notes (Signed)
Called to room to assist during endoscopic procedure.  Patient ID and intended procedure confirmed with present staff. Received instructions for my participation in the procedure from the performing physician.  

## 2016-11-27 NOTE — Progress Notes (Signed)
To PACU VSS. Report to [email or web address] 

## 2016-11-28 ENCOUNTER — Telehealth: Payer: Self-pay

## 2016-11-28 NOTE — Telephone Encounter (Signed)
  Follow up Call-  Call back number 11/27/2016  Post procedure Call Back phone  # [phone]  Permission to leave phone message Yes  Some recent data might be hidden     Patient questions:  Do you have a fever, pain , or abdominal swelling? No. Pain Score  0 *  Have you tolerated food without any problems? Yes.    Have you been able to return to your normal activities? Yes.    Do you have any questions about your discharge instructions: Diet   No. Medications  No. Follow up visit  No.  Do you have questions or concerns about your Care? No.  Actions: * If pain score is 4 or above: No action needed, pain <4.

## 2017-02-05 ENCOUNTER — Encounter: Payer: Self-pay | Admitting: Gastroenterology

## 2017-02-05 ENCOUNTER — Ambulatory Visit (INDEPENDENT_AMBULATORY_CARE_PROVIDER_SITE_OTHER): Payer: Medicare Other | Admitting: Gastroenterology

## 2017-02-05 VITALS — BP 152/80 | HR 64 | Ht 71.0 in | Wt 209.0 lb

## 2017-02-05 DIAGNOSIS — K5100 Ulcerative (chronic) pancolitis without complications: Secondary | ICD-10-CM | POA: Diagnosis not present

## 2017-02-05 NOTE — Patient Instructions (Addendum)
Continue your current regimen: sulfalazine 6gms daily, miralax every other day, rowasa periodically.  Please return to see Dr. [name] in 1 year.  Recall colonoscopy 10/2018 for elevated risk screening given your chronic inflammation.  Normal BMI (Body Mass Index- based on height and weight) is between 23 and 30. Your BMI today is Body mass index is 29.15 kg/m. Marland Kitchen Please consider follow up  regarding your BMI with your Primary Care Provider.

## 2017-02-05 NOTE — Progress Notes (Signed)
Review of pertinent gastrointestinal problems:  1. Diagnosed with UC in mid 1980s in [place] bloody diarrhea; Moved to [facility], GI MD Dr. Ladean Raya cared for 1990s; was on prednisone early on in problem. Seems to flare 1-2 per year with urgency, mucous output maybe a bit of blood. Will take rowasa enemas for a month. Chronically on sulfasalazine. He has been getting q two year colonoscopies.Has believes that he has only had a very limited left sided disease. Multiple colonoscopies done over the past 20 years. I reviewed many of them from Dr. Ladean Raya; it is clear that he has had only macroscopic disease in the rectosigmoid colon up to about 25 cm however biopsies of his descending, right colon have also shown changes of chronic inflammatory bowel disease several times. His last colonoscopy outside was January 2013. Colonoscopy 05/2013 [name] terminal ileum was normal. There was clear, moderate inflammation from anus to about 60cm proximal (very indistinct transition to normal mucosa). The right colon was essentially normal appearing. Biopsies showed chronic and active inflammation in left colon; right colon was normal. 06/2012 changed to mesalamine full strength, nightly rowasa. 2015, continued intermittent flares, offered several options for medical management, decided to increase the sulfasalazine to 6 g daily, also mesalamine enemas. Colonoscopy Dr. [name] 10/2016: Normal TI, mild inflammation to 30cm (path quiencent colitis), focal hepatic flexure inflammation (chronic moderately active colitis). Random colon biopsies without dysplasia  2. Upper endoscopy 2013, small 'ulcers in stomach.' Ulcers had healed on nexium daily. Small submucosal lesion in EGD, eventually evaluated at [facility]. DR. Janeice Robinson by EUS, was told it was fatty lesion (lipoma) and it needed no further following.  3. Ischemic colitis (likely) 08/2016: acute abd pain, prominantly inflammed sigmoid segment on CT 08/2016.  HPI: This is a very pleasant 71 year old  man whom I last saw the time of colonoscopy about 3 months ago.  Chief complaint is chronic ulcerative colitis  miralax every other day. rowasa every 10 days. He takes sulfasalazine 2 g 3 times a day.  On this regimen he feels great. He has a bowel movement just about every day. No bleeding, no abdominal pains. His weight has been stable.  ROS: complete GI ROS as described in HPI, all other review negative.  Constitutional:  No unintentional weight loss   Past Medical History:  Diagnosis Date  . Chronic kidney disease    kidney stones?  . Coronary artery calcification seen on CAT scan   . GERD (gastroesophageal reflux disease)   . Hypercholesteremia   . Hypertension   . Hypothyroidism    no meds  . Sleep apnea    never been tested  . Ulcerative colitis     Past Surgical History:  Procedure Laterality Date  . COLONOSCOPY    . SKIN TAG REMOVAL Right 2018   underarm    Current Outpatient Prescriptions  Medication Sig Dispense Refill  . amLODipine (NORVASC) 10 MG tablet Take 1 tablet (10 mg total) by mouth daily. 90 tablet 0  . atenolol (TENORMIN) 25 MG tablet Take 1 tablet (25 mg total) by mouth daily. 90 tablet 0  . atorvastatin (LIPITOR) 20 MG tablet Take 20 mg by mouth daily.    . benazepril (LOTENSIN) 40 MG tablet Take 1 tablet (40 mg total) by mouth daily. 90 tablet 0  . calcium carbonate (OS-CAL) 600 MG TABS Take 600 mg by mouth daily.     Marland Kitchen esomeprazole (NEXIUM) 20 MG capsule Take 20 mg by mouth daily at 12 noon.    Marland Kitchen  mesalamine (ROWASA) 4 g enema Place 60 mLs (4 g total) rectally at bedtime. Place 60 mls rectally at bedtime 90 Bottle 1  . Multiple Vitamin (MULTIVITAMIN) tablet Take 1 tablet by mouth daily.    . polyethylene glycol (MIRALAX / GLYCOLAX) packet Take 17 g by mouth daily as needed.    . sulfaSALAzine (AZULFIDINE) 500 MG tablet TAKE 4 TABLETS BY MOUTH 3  TIMES DAILY 1080 tablet 6   Current Facility-Administered Medications  Medication Dose Route  Frequency Provider Last Rate Last Dose  . 0.9 %  sodium chloride infusion  500 mL Intravenous Continuous Milus Banister, MD        Allergies as of 02/05/2017  . (No Known Allergies)    Family History  Problem Relation Age of Onset  . Liver cancer Mother   . Heart disease Brother   . Lymphoma Brother   . Kidney disease Brother   . Colon cancer Neg Hx     Social History   Social History  . Marital status: Married    Spouse name: N/A  . Number of children: 3  . Years of education: N/A   Occupational History  . Retired    Social History Main Topics  . Smoking status: Never Smoker  . Smokeless tobacco: Never Used  . Alcohol use Yes     Comment: 1-2 cans beer monthly  . Drug use: No  . Sexual activity: Not on file   Other Topics Concern  . Not on file   Social History Narrative  . No narrative on file     Physical Exam: BP (!) 152/80   Pulse 64   Ht 5' 11"  (1.803 m)   Wt 209 lb (94.8 kg)   BMI 29.15 kg/m  Constitutional: generally well-appearing Psychiatric: alert and oriented x3 Abdomen: soft, nontender, nondistended, no obvious ascites, no peritoneal signs, normal bowel sounds No peripheral edema noted in lower extremities  Assessment and plan: 70 y.o. male with Chronic ulcerative colitis  He is doing very well on his current regimen of periodic Rowasa, every other day MiraLAX, 6 g of sulfasalazine daily. He will return to see me in the office in about one year. We'll plan on every other year colonoscopy for high risk screening given his long-standing ulcerative colitis.  Please see the "Patient Instructions" section for addition details about the plan.  [name], MD [facility] 02/05/2017, 9:37 AM

## 2017-02-06 ENCOUNTER — Other Ambulatory Visit: Payer: Self-pay | Admitting: Family Medicine

## 2017-02-06 ENCOUNTER — Other Ambulatory Visit: Payer: Self-pay | Admitting: Gastroenterology

## 2017-03-12 ENCOUNTER — Other Ambulatory Visit: Payer: Self-pay | Admitting: Family Medicine

## 2017-03-12 MED ORDER — ATORVASTATIN CALCIUM 20 MG PO TABS: 20.0000 mg | ORAL_TABLET | Freq: Every day | ORAL | 1 refills | Status: DC

## 2017-03-14 DIAGNOSIS — Z23 Encounter for immunization: Secondary | ICD-10-CM | POA: Diagnosis not present

## 2017-04-22 ENCOUNTER — Other Ambulatory Visit: Payer: Self-pay | Admitting: Family Medicine

## 2017-06-11 ENCOUNTER — Other Ambulatory Visit: Payer: Medicare Other

## 2017-06-11 ENCOUNTER — Other Ambulatory Visit: Payer: Self-pay

## 2017-06-11 DIAGNOSIS — R0789 Other chest pain: Secondary | ICD-10-CM | POA: Diagnosis not present

## 2017-06-11 DIAGNOSIS — Z125 Encounter for screening for malignant neoplasm of prostate: Secondary | ICD-10-CM | POA: Diagnosis not present

## 2017-06-11 DIAGNOSIS — Z1329 Encounter for screening for other suspected endocrine disorder: Secondary | ICD-10-CM | POA: Diagnosis not present

## 2017-06-11 DIAGNOSIS — E039 Hypothyroidism, unspecified: Secondary | ICD-10-CM | POA: Diagnosis not present

## 2017-06-11 DIAGNOSIS — E7800 Pure hypercholesterolemia, unspecified: Secondary | ICD-10-CM | POA: Diagnosis not present

## 2017-06-11 DIAGNOSIS — Z13220 Encounter for screening for lipoid disorders: Secondary | ICD-10-CM | POA: Diagnosis not present

## 2017-06-11 DIAGNOSIS — Z0000 Encounter for general adult medical examination without abnormal findings: Secondary | ICD-10-CM

## 2017-06-12 LAB — CBC WITH DIFFERENTIAL/PLATELET
BASOS ABS: 57 {cells}/uL (ref 0–200)
BASOS PCT: 1 %
EOS ABS: 131 {cells}/uL (ref 15–500)
Eosinophils Relative: 2.3 %
HCT: 33.5 % — ABNORMAL LOW (ref 38.5–50.0)
HEMOGLOBIN: 11.6 g/dL — AB (ref 13.2–17.1)
Lymphs Abs: 1174 cells/uL (ref 850–3900)
MCH: 32 pg (ref 27.0–33.0)
MCHC: 34.6 g/dL (ref 32.0–36.0)
MCV: 92.5 fL (ref 80.0–100.0)
MONOS PCT: 10.3 %
MPV: 11.2 fL (ref 7.5–12.5)
Neutro Abs: 3751 cells/uL (ref 1500–7800)
Neutrophils Relative %: 65.8 %
PLATELETS: 210 10*3/uL (ref 140–400)
RBC: 3.62 10*6/uL — ABNORMAL LOW (ref 4.20–5.80)
RDW: 11.5 % (ref 11.0–15.0)
TOTAL LYMPHOCYTE: 20.6 %
WBC: 5.7 10*3/uL (ref 3.8–10.8)
WBCMIX: 587 {cells}/uL (ref 200–950)

## 2017-06-12 LAB — COMPLETE METABOLIC PANEL WITH GFR
AG Ratio: 1.4 (calc) (ref 1.0–2.5)
ALBUMIN MSPROF: 3.8 g/dL (ref 3.6–5.1)
ALKALINE PHOSPHATASE (APISO): 61 U/L (ref 40–115)
ALT: 15 U/L (ref 9–46)
AST: 36 U/L — ABNORMAL HIGH (ref 10–35)
BILIRUBIN TOTAL: 0.4 mg/dL (ref 0.2–1.2)
BUN: 14 mg/dL (ref 7–25)
CHLORIDE: 107 mmol/L (ref 98–110)
CO2: 25 mmol/L (ref 20–32)
Calcium: 9.3 mg/dL (ref 8.6–10.3)
Creat: 0.99 mg/dL (ref 0.70–1.18)
GFR, EST AFRICAN AMERICAN: 88 mL/min/{1.73_m2} (ref >=60)
GFR, Est Non African American: 76 mL/min/{1.73_m2} (ref >=60)
GLUCOSE: 97 mg/dL (ref 65–99)
Globulin: 2.7 g/dL (calc) (ref 1.9–3.7)
Potassium: 4.5 mmol/L (ref 3.5–5.3)
SODIUM: 141 mmol/L (ref 135–146)
TOTAL PROTEIN: 6.5 g/dL (ref 6.1–8.1)

## 2017-06-12 LAB — HEPATITIS C ANTIBODY
Hepatitis C Ab: NONREACTIVE
SIGNAL TO CUT-OFF: 0.01 (ref <1.00)

## 2017-06-12 LAB — TSH: TSH: 6.57 m[IU]/L — AB (ref 0.40–4.50)

## 2017-06-12 LAB — LIPID PANEL
Cholesterol: 155 mg/dL (ref <200)
HDL: 47 mg/dL (ref >40)
LDL Cholesterol (Calc): 90 mg/dL (calc)
NON-HDL CHOLESTEROL (CALC): 108 mg/dL (ref <130)
TRIGLYCERIDES: 90 mg/dL (ref <150)
Total CHOL/HDL Ratio: 3.3 (calc) (ref <5.0)

## 2017-06-14 ENCOUNTER — Encounter: Payer: Self-pay | Admitting: Family Medicine

## 2017-06-14 ENCOUNTER — Ambulatory Visit (INDEPENDENT_AMBULATORY_CARE_PROVIDER_SITE_OTHER): Payer: Medicare Other | Admitting: Family Medicine

## 2017-06-14 VITALS — BP 150/74 | HR 60 | Temp 97.9°F | Resp 14 | Ht 71.0 in | Wt 214.0 lb

## 2017-06-14 DIAGNOSIS — E7800 Pure hypercholesterolemia, unspecified: Secondary | ICD-10-CM | POA: Diagnosis not present

## 2017-06-14 DIAGNOSIS — Z0000 Encounter for general adult medical examination without abnormal findings: Secondary | ICD-10-CM

## 2017-06-14 DIAGNOSIS — R0789 Other chest pain: Secondary | ICD-10-CM | POA: Diagnosis not present

## 2017-06-14 DIAGNOSIS — I10 Essential (primary) hypertension: Secondary | ICD-10-CM

## 2017-06-14 DIAGNOSIS — E038 Other specified hypothyroidism: Secondary | ICD-10-CM

## 2017-06-14 DIAGNOSIS — E039 Hypothyroidism, unspecified: Secondary | ICD-10-CM

## 2017-06-14 DIAGNOSIS — Z125 Encounter for screening for malignant neoplasm of prostate: Secondary | ICD-10-CM | POA: Diagnosis not present

## 2017-06-14 MED ORDER — HYDROCHLOROTHIAZIDE 25 MG PO TABS: 25.0000 mg | ORAL_TABLET | Freq: Every day | ORAL | 3 refills | Status: DC

## 2017-06-14 NOTE — Progress Notes (Signed)
Subjective:    Patient ID: Stuart Gentry, male    DOB: 1946-02-07, 72 y.o.   MRN: [id number]  HPI  Patient is here for CPE.  His most recent labs are listed below: Appointment on 06/11/2017  Component Date Value Ref Range Status  . Hepatitis C Ab 06/11/2017 NON-REACTIVE  NON-REACTI Final  . SIGNAL TO CUT-OFF 06/11/2017 0.01  <1.00 Final  . Cholesterol 06/11/2017 155  <200 mg/dL Final  . HDL 06/11/2017 47  >40 mg/dL Final  . Triglycerides 06/11/2017 90  <150 mg/dL Final  . LDL Cholesterol (Calc) 06/11/2017 90  mg/dL (calc) Final   Comment: Reference range: <100 . Desirable range <100 mg/dL for primary prevention;   <70 mg/dL for patients with CHD or diabetic patients  with > or = 2 CHD risk factors. Marland Kitchen LDL-C is now calculated using the Martin-Hopkins  calculation, which is a validated novel method providing  better accuracy than the Friedewald equation in the  estimation of LDL-C.  Cresenciano Genre et al. Annamaria Helling. 9323;557(32): 2061-2068  (http://education.[facility].com/faq/FAQ164)   . Total CHOL/HDL Ratio 06/11/2017 3.3  <5.0 (calc) Final  . Non-HDL Cholesterol (Calc) 06/11/2017 108  <130 mg/dL (calc) Final   Comment: For patients with diabetes plus 1 major ASCVD risk  factor, treating to a non-HDL-C goal of <100 mg/dL  (LDL-C of <70 mg/dL) is considered a therapeutic  option.   . TSH 06/11/2017 6.57* 0.40 - 4.50 mIU/L Final  . WBC 06/11/2017 5.7  3.8 - 10.8 Thousand/uL Final  . RBC 06/11/2017 3.62* 4.20 - 5.80 Million/uL Final  . Hemoglobin 06/11/2017 11.6* 13.2 - 17.1 g/dL Final  . HCT 06/11/2017 33.5* 38.5 - 50.0 % Final  . MCV 06/11/2017 92.5  80.0 - 100.0 fL Final  . MCH 06/11/2017 32.0  27.0 - 33.0 pg Final  . MCHC 06/11/2017 34.6  32.0 - 36.0 g/dL Final  . RDW 06/11/2017 11.5  11.0 - 15.0 % Final  . Platelets 06/11/2017 210  140 - 400 Thousand/uL Final  . MPV 06/11/2017 11.2  7.5 - 12.5 fL Final  . Neutro Abs 06/11/2017 3,751  1,500 - 7,800 cells/uL Final  .  Lymphs Abs 06/11/2017 1,174  850 - 3,900 cells/uL Final  . WBC mixed population 06/11/2017 587  200 - 950 cells/uL Final  . Eosinophils Absolute 06/11/2017 131  15 - 500 cells/uL Final  . Basophils Absolute 06/11/2017 57  0 - 200 cells/uL Final  . Neutrophils Relative % 06/11/2017 65.8  % Final  . Total Lymphocyte 06/11/2017 20.6  % Final  . Monocytes Relative 06/11/2017 10.3  % Final  . Eosinophils Relative 06/11/2017 2.3  % Final  . Basophils Relative 06/11/2017 1.0  % Final  . Glucose, Bld 06/11/2017 97  65 - 99 mg/dL Final   Comment: .            Fasting reference interval .   . BUN 06/11/2017 14  7 - 25 mg/dL Final  . Creat 06/11/2017 0.99  0.70 - 1.18 mg/dL Final   Comment: For patients >57 years of age, the reference limit for Creatinine is approximately 13% higher for people identified as African-American. .   . GFR, Est Non African American 06/11/2017 76  > OR = 60 mL/min/1.18m2 Final  . GFR, Est African American 06/11/2017 88  > OR = 60 mL/min/1.83m2 Final  . BUN/Creatinine Ratio 20/25/4270 NOT APPLICABLE  6 - 22 (calc) Final  . Sodium 06/11/2017 141  135 - 146 mmol/L Final  . Potassium  06/11/2017 4.5  3.5 - 5.3 mmol/L Final  . Chloride 06/11/2017 107  98 - 110 mmol/L Final  . CO2 06/11/2017 25  20 - 32 mmol/L Final  . Calcium 06/11/2017 9.3  8.6 - 10.3 mg/dL Final  . Total Protein 06/11/2017 6.5  6.1 - 8.1 g/dL Final  . Albumin 06/11/2017 3.8  3.6 - 5.1 g/dL Final  . Globulin 06/11/2017 2.7  1.9 - 3.7 g/dL (calc) Final  . AG Ratio 06/11/2017 1.4  1.0 - 2.5 (calc) Final  . Total Bilirubin 06/11/2017 0.4  0.2 - 1.2 mg/dL Final  . Alkaline phosphatase (APISO) 06/11/2017 61  40 - 115 U/L Final  . AST 06/11/2017 36* 10 - 35 U/L Final  . ALT 06/11/2017 15  9 - 46 U/L Final   His immunizations are listed below: Immunization History  Administered Date(s) Administered  . Influenza Whole 01/29/2007  . Influenza, High Dose Seasonal PF 02/15/2016  . Influenza,inj,Quad PF,6+  Mos 03/21/2015  . Pneumococcal Conjugate-13 07/07/2013  . Pneumococcal Polysaccharide-23 11/29/2007, 03/21/2015  . Td 04/30/2005   Last colonoscopy was 10/2016 and is due again in 10/2019.  He received his flu shot at a local pharmacy.  He is due for a tetanus shot and the shingles vaccine.  However he states that his real reason for making this appointment with some atypical chest pain that he experienced about 2 weeks ago.  He states that 2 days prior to the chest pain, he drug a heavy fuel tank up the side of a heel.  The following day both shoulders are very sore.  He also developed a tightness/dull aching pain in the center of his chest.  It lasted throughout the day.  It was very mild but it was present.  There was no exacerbating or alleviating factors.  There seem to be no association with activity.  However he has noticed some increasing shortness of breath with activity recently.  He states that he becomes easily winded.  Shortness of breath will improve after 30 seconds however it is definitely progressing.  He attributes this to his deconditioning.  He states that he is not getting any real aerobic exercise.  However on a CAT scan obtain this summer, there were some coronary artery calcifications as a coincidental finding. Past Medical History:  Diagnosis Date  . Chronic kidney disease    kidney stones?  . Coronary artery calcification seen on CAT scan   . GERD (gastroesophageal reflux disease)   . Hypercholesteremia   . Hypertension   . Hypothyroidism    no meds  . Sleep apnea    never been tested  . Ulcerative colitis    Past Surgical History:  Procedure Laterality Date  . COLONOSCOPY    . SKIN TAG REMOVAL Right 2018   underarm   Current Outpatient Medications on File Prior to Visit  Medication Sig Dispense Refill  . amLODipine (NORVASC) 10 MG tablet TAKE 1 TABLET BY MOUTH  DAILY 90 tablet 0  . atenolol (TENORMIN) 25 MG tablet TAKE 1 TABLET BY MOUTH  DAILY 90 tablet 0  .  atorvastatin (LIPITOR) 20 MG tablet Take 1 tablet (20 mg total) daily by mouth. 90 tablet 1  . benazepril (LOTENSIN) 40 MG tablet TAKE 1 TABLET BY MOUTH  DAILY 90 tablet 0  . calcium carbonate (OS-CAL) 600 MG TABS Take 600 mg by mouth daily.     Marland Kitchen esomeprazole (NEXIUM) 20 MG capsule Take 20 mg by mouth daily at 12 noon.    Marland Kitchen  mesalamine (ROWASA) 4 g enema Place 60 mLs (4 g total) rectally at bedtime. Place 60 mls rectally at bedtime 90 Bottle 1  . Multiple Vitamin (MULTIVITAMIN) tablet Take 1 tablet by mouth daily.    . polyethylene glycol (MIRALAX / GLYCOLAX) packet Take 17 g by mouth daily as needed.    . sulfaSALAzine (AZULFIDINE) 500 MG tablet TAKE 4 TABLETS BY MOUTH 3  TIMES DAILY 1080 tablet 11   Current Facility-Administered Medications on File Prior to Visit  Medication Dose Route Frequency Provider Last Rate Last Dose  . 0.9 %  sodium chloride infusion  500 mL Intravenous Continuous Milus Banister, MD       No Known Allergies Social History   Socioeconomic History  . Marital status: Married    Spouse name: Not on file  . Number of children: 3  . Years of education: Not on file  . Highest education level: Not on file  Social Needs  . Financial resource strain: Not on file  . Food insecurity - worry: Not on file  . Food insecurity - inability: Not on file  . Transportation needs - medical: Not on file  . Transportation needs - non-medical: Not on file  Occupational History  . Occupation: Retired  Tobacco Use  . Smoking status: Never Smoker  . Smokeless tobacco: Never Used  Substance and Sexual Activity  . Alcohol use: Yes    Comment: 1-2 cans beer monthly  . Drug use: No  . Sexual activity: Not on file  Other Topics Concern  . Not on file  Social History Narrative  . Not on file     Review of Systems  All other systems reviewed and are negative.      Objective:   Physical Exam  Constitutional: He is oriented to person, place, and time. He appears  well-developed and well-nourished. No distress.  HENT:  Head: Normocephalic and atraumatic.  Right Ear: External ear normal.  Left Ear: External ear normal.  Nose: Nose normal.  Mouth/Throat: Oropharynx is clear and moist. No oropharyngeal exudate.  Eyes: Conjunctivae and EOM are normal. Pupils are equal, round, and reactive to light. Right eye exhibits no discharge. Left eye exhibits no discharge. No scleral icterus.  Neck: Normal range of motion. Neck supple. No JVD present. No tracheal deviation present. No thyromegaly present.  Cardiovascular: Normal rate, regular rhythm, normal heart sounds and intact distal pulses. Exam reveals no gallop and no friction rub.  No murmur heard. Pulmonary/Chest: Effort normal and breath sounds normal. No stridor. No respiratory distress. He has no wheezes. He has no rales. He exhibits no tenderness.  Abdominal: Soft. Bowel sounds are normal. He exhibits no distension and no mass. There is no tenderness. There is no rebound and no guarding.  Genitourinary: Rectum normal and prostate normal.  Musculoskeletal: He exhibits no edema, tenderness or deformity.  Lymphadenopathy:    He has no cervical adenopathy.  Neurological: He is alert and oriented to person, place, and time. He has normal reflexes. He displays normal reflexes. No cranial nerve deficit. He exhibits normal muscle tone. Coordination normal.  Skin: Skin is warm. No rash noted. He is not diaphoretic. No erythema. No pallor.  Psychiatric: He has a normal mood and affect. His behavior is normal. Judgment and thought content normal.  Vitals reviewed.         Assessment & Plan:  General medical exam  Benign essential HTN  Subclinical hypothyroidism  Pure hypercholesterolemia  Prostate cancer screening  EKG today  shows normal sinus rhythm with fasicular block /widened QRS interval, mild bradycardia and with no evidence of ischemia or infarction.  Due to his risk factors, age, hypertension,  hyperlipidemia, and coronary artery calcifications on recent CT, I have recommended POET/ stress test to ensure his safety to begin exercise program to treat his dyspnea which is likely due to deconditioning.   Meanwhile I will treat his elevated blood pressure with hydrochlorothiazide 25 mg p.o. daily.  The remainder of his lab work is excellent.  His LDL cholesterol has improved dramatically from the summer on statin medication. flu shot is up-to-date.  I recommended a tetanus shot and a shingles vaccine but the patient deferred at the present time

## 2017-07-08 ENCOUNTER — Encounter: Payer: Self-pay | Admitting: Family Medicine

## 2017-07-17 ENCOUNTER — Other Ambulatory Visit: Payer: Self-pay | Admitting: Family Medicine

## 2017-07-23 NOTE — H&P (View-Only) (Signed)
Cardiology Office Note   Date:  07/29/2017   ID:  Stuart Gentry, DOB 1945/12/19, MRN [id number]+  PCP:  [name], MD  Cardiologist:   Jenkins Rouge, MD   No chief complaint on file.     History of Present Illness: Stuart Gentry is a 72 y.o. male who presents for consultation regarding chest pain. Referred by Dr [name] Reviewed his office Note from 06/14/17 CRF;s HTN, HLD  CT abdomen 09/12/16 showed aortic atherosclerosis and some coronary artery calcium He also has Hypothyroidism on replacement. Has deconditioning and exertional dyspnea. Started on HCTZ 06/14/17 for better BP control.  Has not had previous stress testing   Retired from [employer] Wife indicates he watches tv tood much. Exertional dyspnea seems functional and tightness in chest Related to dyspnea. Has not had stress testing Compliant with meds Home BP readings tend to be a bit better    Past Medical History:  Diagnosis Date  . Chronic kidney disease    kidney stones?  . Coronary artery calcification seen on CAT scan   . GERD (gastroesophageal reflux disease)   . Hypercholesteremia   . Hypertension   . Hypothyroidism    no meds  . Sleep apnea    never been tested  . Ulcerative colitis     Past Surgical History:  Procedure Laterality Date  . COLONOSCOPY    . SKIN TAG REMOVAL Right 2018   underarm     Current Outpatient Medications  Medication Sig Dispense Refill  . amLODipine (NORVASC) 10 MG tablet TAKE 1 TABLET BY MOUTH  DAILY 90 tablet 0  . atenolol (TENORMIN) 25 MG tablet TAKE 1 TABLET BY MOUTH  DAILY 90 tablet 0  . atorvastatin (LIPITOR) 20 MG tablet TAKE 1 TABLET BY MOUTH  EVERY DAY 90 tablet 1  . benazepril (LOTENSIN) 40 MG tablet TAKE 1 TABLET BY MOUTH  DAILY 90 tablet 0  . esomeprazole (NEXIUM) 20 MG capsule Take 20 mg by mouth daily at 12 noon.    . hydrochlorothiazide (HYDRODIURIL) 25 MG tablet Take 1 tablet (25 mg total) by mouth daily. 90 tablet 3  . mesalamine  (ROWASA) 4 g enema Place 60 mLs (4 g total) rectally at bedtime. Place 60 mls rectally at bedtime 90 Bottle 1  . Multiple Vitamin (MULTIVITAMIN) tablet Take 1 tablet by mouth daily.    . polyethylene glycol (MIRALAX / GLYCOLAX) packet Take 17 g by mouth daily as needed.    . sulfaSALAzine (AZULFIDINE) 500 MG tablet TAKE 4 TABLETS BY MOUTH 3  TIMES DAILY 1080 tablet 11   No current facility-administered medications for this visit.     Allergies:   Patient has no known allergies.    Social History:  The patient  reports that he has never smoked. He has never used smokeless tobacco. He reports that he drinks alcohol. He reports that he does not use drugs.   Family History:  The patient's family history includes Heart disease in his brother; Kidney disease in his brother; Liver cancer in his mother; Lymphoma in his brother.    ROS:  Please see the history of present illness.   Otherwise, review of systems are positive for none.   All other systems are reviewed and negative.    PHYSICAL EXAM: VS:  BP (!) 152/98   Pulse 62   Ht 5' 11"  (1.803 m)   Wt 207 lb 3.2 oz (94 kg)   BMI 28.90 kg/m  , BMI Body mass  index is 28.9 kg/m. Affect appropriate Healthy:  appears stated age 34: normal Neck supple with no adenopathy JVP normal no bruits no thyromegaly Lungs clear with no wheezing and good diaphragmatic motion Heart:  S1/S2 no murmur, no rub, gallop or click PMI normal Abdomen: benighn, BS positve, no tenderness, no AAA no bruit.  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    ASSESSMENT AND PLAN:  1.  CAD: calcium seen on CT CRF;s HTN and HLD will order exercise myovue study  2. Dyspnea:  Discussed cutting back on salt f/u primary just started diuretic    4. HLD:  Continue statin  Lab Results  Component Value Date   LDLCALC 90 06/11/2017      Current medicines are reviewed at length with the patient today.  The patient does not have concerns regarding medicines.  The following changes have been made:  no change  Labs/ tests ordered today include: Ex Myovue   Orders Placed This Encounter  Procedures  . MYOCARDIAL PERFUSION IMAGING     Disposition:   FU with cardiology PRN if stress test normal      Signed, Jenkins Rouge, MD  07/29/2017 8:34 AM    [facility] [place], [place], [place]  [address] Phone: [phone]; Fax: [phone]

## 2017-07-23 NOTE — Progress Notes (Signed)
Cardiology Office Note   Date:  07/29/2017   ID:  Stuart Gentry, DOB Mar 12, 1946, MRN [id number]+  PCP:  [name], MD  Cardiologist:   Jenkins Rouge, MD   No chief complaint on file.     History of Present Illness: Stuart Gentry is a 72 y.o. male who presents for consultation regarding chest pain. Referred by Dr [name] Reviewed his office Note from 06/14/17 CRF;s HTN, HLD  CT abdomen 09/12/16 showed aortic atherosclerosis and some coronary artery calcium He also has Hypothyroidism on replacement. Has deconditioning and exertional dyspnea. Started on HCTZ 06/14/17 for better BP control.  Has not had previous stress testing   Retired from [employer] Wife indicates he watches tv tood much. Exertional dyspnea seems functional and tightness in chest Related to dyspnea. Has not had stress testing Compliant with meds Home BP readings tend to be a bit better    Past Medical History:  Diagnosis Date  . Chronic kidney disease    kidney stones?  . Coronary artery calcification seen on CAT scan   . GERD (gastroesophageal reflux disease)   . Hypercholesteremia   . Hypertension   . Hypothyroidism    no meds  . Sleep apnea    never been tested  . Ulcerative colitis     Past Surgical History:  Procedure Laterality Date  . COLONOSCOPY    . SKIN TAG REMOVAL Right 2018   underarm     Current Outpatient Medications  Medication Sig Dispense Refill  . amLODipine (NORVASC) 10 MG tablet TAKE 1 TABLET BY MOUTH  DAILY 90 tablet 0  . atenolol (TENORMIN) 25 MG tablet TAKE 1 TABLET BY MOUTH  DAILY 90 tablet 0  . atorvastatin (LIPITOR) 20 MG tablet TAKE 1 TABLET BY MOUTH  EVERY DAY 90 tablet 1  . benazepril (LOTENSIN) 40 MG tablet TAKE 1 TABLET BY MOUTH  DAILY 90 tablet 0  . esomeprazole (NEXIUM) 20 MG capsule Take 20 mg by mouth daily at 12 noon.    . hydrochlorothiazide (HYDRODIURIL) 25 MG tablet Take 1 tablet (25 mg total) by mouth daily. 90 tablet 3  . mesalamine  (ROWASA) 4 g enema Place 60 mLs (4 g total) rectally at bedtime. Place 60 mls rectally at bedtime 90 Bottle 1  . Multiple Vitamin (MULTIVITAMIN) tablet Take 1 tablet by mouth daily.    . polyethylene glycol (MIRALAX / GLYCOLAX) packet Take 17 g by mouth daily as needed.    . sulfaSALAzine (AZULFIDINE) 500 MG tablet TAKE 4 TABLETS BY MOUTH 3  TIMES DAILY 1080 tablet 11   No current facility-administered medications for this visit.     Allergies:   Patient has no known allergies.    Social History:  The patient  reports that he has never smoked. He has never used smokeless tobacco. He reports that he drinks alcohol. He reports that he does not use drugs.   Family History:  The patient's family history includes Heart disease in his brother; Kidney disease in his brother; Liver cancer in his mother; Lymphoma in his brother.    ROS:  Please see the history of present illness.   Otherwise, review of systems are positive for none.   All other systems are reviewed and negative.    PHYSICAL EXAM: VS:  BP (!) 152/98   Pulse 62   Ht 5' 11"  (1.803 m)   Wt 207 lb 3.2 oz (94 kg)   BMI 28.90 kg/m  , BMI Body mass  index is 28.9 kg/m. Affect appropriate Healthy:  appears stated age 81: normal Neck supple with no adenopathy JVP normal no bruits no thyromegaly Lungs clear with no wheezing and good diaphragmatic motion Heart:  S1/S2 no murmur, no rub, gallop or click PMI normal Abdomen: benighn, BS positve, no tenderness, no AAA no bruit.  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    ASSESSMENT AND PLAN:  1.  CAD: calcium seen on CT CRF;s HTN and HLD will order exercise myovue study  2. Dyspnea:  Discussed cutting back on salt f/u primary just started diuretic    4. HLD:  Continue statin  Lab Results  Component Value Date   LDLCALC 90 06/11/2017      Current medicines are reviewed at length with the patient today.  The patient does not have concerns regarding medicines.  The following changes have been made:  no change  Labs/ tests ordered today include: Ex Myovue   Orders Placed This Encounter  Procedures  . MYOCARDIAL PERFUSION IMAGING     Disposition:   FU with cardiology PRN if stress test normal      Signed, Jenkins Rouge, MD  07/29/2017 8:34 AM    [facility] [place], [place], [place]  [address] Phone: [phone]; Fax: [phone]

## 2017-07-29 ENCOUNTER — Encounter: Payer: Self-pay | Admitting: Cardiovascular Disease

## 2017-07-29 ENCOUNTER — Ambulatory Visit (INDEPENDENT_AMBULATORY_CARE_PROVIDER_SITE_OTHER): Payer: Medicare Other | Admitting: Cardiovascular Disease

## 2017-07-29 VITALS — BP 152/98 | HR 62 | Ht 71.0 in | Wt 207.2 lb

## 2017-07-29 DIAGNOSIS — I25119 Atherosclerotic heart disease of native coronary artery with unspecified angina pectoris: Secondary | ICD-10-CM | POA: Diagnosis not present

## 2017-07-29 DIAGNOSIS — E785 Hyperlipidemia, unspecified: Secondary | ICD-10-CM | POA: Diagnosis not present

## 2017-07-29 DIAGNOSIS — I10 Essential (primary) hypertension: Secondary | ICD-10-CM | POA: Diagnosis not present

## 2017-07-29 DIAGNOSIS — R0600 Dyspnea, unspecified: Secondary | ICD-10-CM | POA: Diagnosis not present

## 2017-07-29 NOTE — Patient Instructions (Addendum)
Medication Instructions:  Your physician recommends that you continue on your current medications as directed. Please refer to the Current Medication list given to you today.  Labwork: NONE  Testing/Procedures: Your physician has requested that you have en exercise stress myoview. For further information please visit [email or web address]. Please follow instruction sheet, as given  Follow-Up: Your physician wants you to follow-up as needed with Dr. [name].   If you need a refill on your cardiac medications before your next appointment, please call your pharmacy.    

## 2017-07-30 ENCOUNTER — Telehealth (HOSPITAL_COMMUNITY): Payer: Self-pay | Admitting: *Deleted

## 2017-07-30 NOTE — Telephone Encounter (Signed)
Patient given detailed instructions per Myocardial Perfusion Study Information Sheet for the test on 08/05/17 at 0730. Patient notified to arrive 15 minutes early and that it is imperative to arrive on time for appointment to keep from having the test rescheduled.  If you need to cancel or reschedule your appointment, please call the office within 24 hours of your appointment. . Patient verbalized understanding.Orla Estrin, Ranae Palms

## 2017-08-05 ENCOUNTER — Ambulatory Visit (HOSPITAL_COMMUNITY): Payer: Medicare Other | Attending: Cardiovascular Disease

## 2017-08-05 ENCOUNTER — Other Ambulatory Visit (HOSPITAL_COMMUNITY): Payer: Self-pay | Admitting: Cardiovascular Disease

## 2017-08-05 ENCOUNTER — Telehealth: Payer: Self-pay

## 2017-08-05 DIAGNOSIS — I2510 Atherosclerotic heart disease of native coronary artery without angina pectoris: Secondary | ICD-10-CM | POA: Diagnosis not present

## 2017-08-05 DIAGNOSIS — R9439 Abnormal result of other cardiovascular function study: Secondary | ICD-10-CM | POA: Insufficient documentation

## 2017-08-05 DIAGNOSIS — E785 Hyperlipidemia, unspecified: Secondary | ICD-10-CM | POA: Diagnosis not present

## 2017-08-05 DIAGNOSIS — R0600 Dyspnea, unspecified: Secondary | ICD-10-CM | POA: Diagnosis not present

## 2017-08-05 DIAGNOSIS — I25119 Atherosclerotic heart disease of native coronary artery with unspecified angina pectoris: Secondary | ICD-10-CM | POA: Diagnosis not present

## 2017-08-05 DIAGNOSIS — R079 Chest pain, unspecified: Secondary | ICD-10-CM

## 2017-08-05 DIAGNOSIS — I10 Essential (primary) hypertension: Secondary | ICD-10-CM | POA: Diagnosis not present

## 2017-08-05 DIAGNOSIS — Z01812 Encounter for preprocedural laboratory examination: Secondary | ICD-10-CM

## 2017-08-05 DIAGNOSIS — I209 Angina pectoris, unspecified: Secondary | ICD-10-CM

## 2017-08-05 LAB — MYOCARDIAL PERFUSION IMAGING
CHL CUP MPHR: 148 {beats}/min
CHL CUP NUCLEAR SSS: 12
CHL CUP RESTING HR STRESS: 57 {beats}/min
CHL RATE OF PERCEIVED EXERTION: 18
CSEPED: 6 min
CSEPEDS: 30 s
CSEPHR: 92 %
Estimated workload: 7.7 METS
LV dias vol: 122 mL (ref 62–150)
LV sys vol: 58 mL
NUC STRESS TID: 0.91
Peak HR: 137 {beats}/min
RATE: 0.3
SDS: 3
SRS: 9

## 2017-08-05 MED ORDER — ASPIRIN EC 81 MG PO TBEC: 81.0000 mg | DELAYED_RELEASE_TABLET | Freq: Every day | ORAL | Status: AC

## 2017-08-05 MED ORDER — TECHNETIUM TC 99M TETROFOSMIN IV KIT
10.2000 | PACK | Freq: Once | INTRAVENOUS | Status: AC | PRN
  Administered 2017-08-05: 10.2 via INTRAVENOUS
  Filled 2017-08-05: qty 11

## 2017-08-05 MED ORDER — TECHNETIUM TC 99M TETROFOSMIN IV KIT
31.6000 | PACK | Freq: Once | INTRAVENOUS | Status: AC | PRN
  Administered 2017-08-05: 31.6 via INTRAVENOUS
  Filled 2017-08-05: qty 32

## 2017-08-05 MED ORDER — NITROGLYCERIN 0.4 MG SL SUBL: 0.4000 mg | SUBLINGUAL_TABLET | SUBLINGUAL | 3 refills | Status: DC | PRN

## 2017-08-05 NOTE — Telephone Encounter (Signed)
-----   Message from Josue Hector, MD sent at 08/05/2017  3:57 PM EDT ----- Myovue suggests ischemia in LAD territory would set up for cath this week On beta blocker take ASA and call in SL nitro

## 2017-08-05 NOTE — Telephone Encounter (Signed)
Called patient and made him aware of his stress test results. Went over instructions for heart cath with patient and his wife. Patient coming in tomorrow for lab work and to pick up instructions. Patient and wife verbalized understanding and will call with any other questions.

## 2017-08-05 NOTE — Telephone Encounter (Signed)
Patient's wife, Arbie Cookey [facility]) is aware of test results. Per Dr. [name], Myovue suggests ischemia in LAD territory would set up for cath this week On beta blocker take ASA and call in SL nitro. Patient will have heart cath done on Friday with Dr. Angelena Form. Patient's wife verbalized understanding.   @LOGO @  [place] OFFICE [address], [place] [address] [address] Dept: [phone] Loc: [place]  08/05/2017  You are scheduled for a Cardiac Catheterization on Friday, April 12 with Dr. [name].  1. Please arrive at the [facility] (Main Entrance A) at [facility]: [address] [place], [place] [address] at 5:30 AM (two hours before your procedure to ensure your preparation). Free valet parking service is available.   Special note: Every effort is made to have your procedure done on time. Please understand that emergencies sometimes delay scheduled procedures.  2. Diet: Do not eat or drink anything after midnight prior to your procedure except sips of water to take medications.  3. Labs: You will need to have blood drawn on Tuesday, April 9 at [facility]. [address]  Open: 7:30am - 5pm    Phone: [phone]. You do not need to be fasting.  4. Medication instructions in preparation for your procedure:  Hold Hydrochlorothiazide on Friday, April 12.    On the morning of your procedure, take your Aspirin and any morning medicines NOT listed above.  You may use sips of water.  5. Plan for one night stay--bring personal belongings. 6. Bring a current list of your medications and current insurance cards. 7. You MUST have a responsible person to drive you home. 8. Someone MUST be with you the first 24 hours after you arrive home or your discharge will be delayed. 9. Please wear clothes that are easy to get on and off and  wear slip-on shoes.  Thank you for allowing [place] to care for you!   -- [facility]

## 2017-08-06 ENCOUNTER — Other Ambulatory Visit: Payer: Medicare Other

## 2017-08-06 DIAGNOSIS — Z01812 Encounter for preprocedural laboratory examination: Secondary | ICD-10-CM | POA: Diagnosis not present

## 2017-08-06 DIAGNOSIS — R9439 Abnormal result of other cardiovascular function study: Secondary | ICD-10-CM | POA: Diagnosis not present

## 2017-08-06 DIAGNOSIS — R079 Chest pain, unspecified: Secondary | ICD-10-CM

## 2017-08-07 ENCOUNTER — Telehealth: Payer: Self-pay | Admitting: *Deleted

## 2017-08-07 LAB — BASIC METABOLIC PANEL
BUN/Creatinine Ratio: 17 (ref 10–24)
BUN: 18 mg/dL (ref 8–27)
CALCIUM: 9.9 mg/dL (ref 8.6–10.2)
CO2: 24 mmol/L (ref 20–29)
CREATININE: 1.08 mg/dL (ref 0.76–1.27)
Chloride: 102 mmol/L (ref 96–106)
GFR calc Af Amer: 79 mL/min/{1.73_m2} (ref >59)
GFR, EST NON AFRICAN AMERICAN: 68 mL/min/{1.73_m2} (ref >59)
Glucose: 110 mg/dL — ABNORMAL HIGH (ref 65–99)
Potassium: 4.8 mmol/L (ref 3.5–5.2)
SODIUM: 141 mmol/L (ref 134–144)

## 2017-08-07 LAB — CBC WITH DIFFERENTIAL/PLATELET
BASOS: 1 %
Basophils Absolute: 0 10*3/uL (ref 0.0–0.2)
EOS (ABSOLUTE): 0.1 10*3/uL (ref 0.0–0.4)
Eos: 2 %
HEMATOCRIT: 32.1 % — AB (ref 37.5–51.0)
Hemoglobin: 11.1 g/dL — ABNORMAL LOW (ref 13.0–17.7)
IMMATURE GRANULOCYTES: 1 %
Immature Grans (Abs): 0 10*3/uL (ref 0.0–0.1)
Lymphocytes Absolute: 1 10*3/uL (ref 0.7–3.1)
Lymphs: 19 %
MCH: 31.9 pg (ref 26.6–33.0)
MCHC: 34.6 g/dL (ref 31.5–35.7)
MCV: 92 fL (ref 79–97)
Monocytes Absolute: 0.6 10*3/uL (ref 0.1–0.9)
Monocytes: 10 %
NEUTROS PCT: 67 %
Neutrophils Absolute: 3.7 10*3/uL (ref 1.4–7.0)
Platelets: 230 10*3/uL (ref 150–379)
RBC: 3.48 x10E6/uL — ABNORMAL LOW (ref 4.14–5.80)
RDW: 13.8 % (ref 12.3–15.4)
WBC: 5.5 10*3/uL (ref 3.4–10.8)

## 2017-08-07 LAB — PROTIME-INR
INR: 1 (ref 0.8–1.2)
Prothrombin Time: 10 s (ref 9.1–12.0)

## 2017-08-07 NOTE — Telephone Encounter (Addendum)
-----   Message from Josue Hector, MD sent at 08/06/2017  5:41 PM EDT ----- Regarding: RE: Cath Friday 4/12 Spoke to him at length on phone  about cath procedure and risks including contrast reaction, bleeding , stroke and need for emergency CABG

## 2017-08-08 ENCOUNTER — Telehealth: Payer: Self-pay | Admitting: *Deleted

## 2017-08-08 NOTE — Telephone Encounter (Addendum)
Catheterization scheduled at [facility] for Friday April 12,2019 7:30 AM Confirmed arrival time and place: [place] Entrance A/[place] at: 5:30 AM Nothing to eat or drink after midnight prior to cath. Verified no known allergies. Verified no diabetes medications  Hold: HCTZ AM of cath  Except hold medications AM meds can be  taken pre-cath with sip of water including: ASA 81 mg  Patient has responsible person to drive home post procedure and observe patient for 24 hours:yes

## 2017-08-09 ENCOUNTER — Ambulatory Visit (HOSPITAL_COMMUNITY): Payer: Medicare Other

## 2017-08-09 ENCOUNTER — Other Ambulatory Visit (HOSPITAL_COMMUNITY): Payer: Medicare Other

## 2017-08-09 ENCOUNTER — Inpatient Hospital Stay (HOSPITAL_COMMUNITY)
Admission: RE | Disposition: A | Discharge status: Home / Self Care | Payer: Self-pay | Source: Ambulatory Visit | Attending: Surgery

## 2017-08-09 ENCOUNTER — Other Ambulatory Visit: Payer: Self-pay | Admitting: *Deleted

## 2017-08-09 ENCOUNTER — Encounter (HOSPITAL_COMMUNITY): Payer: Self-pay | Admitting: Cardiovascular Disease

## 2017-08-09 ENCOUNTER — Other Ambulatory Visit: Payer: Self-pay

## 2017-08-09 ENCOUNTER — Inpatient Hospital Stay (HOSPITAL_COMMUNITY)
Admission: RE | Admit: 2017-08-09 | Discharge: 2017-08-18 | DRG: 234 | Disposition: A | Discharge status: Home / Self Care | Payer: Medicare Other | Source: Ambulatory Visit | Attending: Surgery | Admitting: Surgery

## 2017-08-09 DIAGNOSIS — I080 Rheumatic disorders of both mitral and aortic valves: Secondary | ICD-10-CM | POA: Diagnosis not present

## 2017-08-09 DIAGNOSIS — I371 Nonrheumatic pulmonary valve insufficiency: Secondary | ICD-10-CM | POA: Diagnosis not present

## 2017-08-09 DIAGNOSIS — I2510 Atherosclerotic heart disease of native coronary artery without angina pectoris: Secondary | ICD-10-CM | POA: Diagnosis not present

## 2017-08-09 DIAGNOSIS — I480 Paroxysmal atrial fibrillation: Secondary | ICD-10-CM | POA: Diagnosis not present

## 2017-08-09 DIAGNOSIS — K219 Gastro-esophageal reflux disease without esophagitis: Secondary | ICD-10-CM | POA: Diagnosis present

## 2017-08-09 DIAGNOSIS — Z841 Family history of disorders of kidney and ureter: Secondary | ICD-10-CM

## 2017-08-09 DIAGNOSIS — K5900 Constipation, unspecified: Secondary | ICD-10-CM | POA: Diagnosis not present

## 2017-08-09 DIAGNOSIS — J90 Pleural effusion, not elsewhere classified: Secondary | ICD-10-CM | POA: Diagnosis not present

## 2017-08-09 DIAGNOSIS — Z951 Presence of aortocoronary bypass graft: Secondary | ICD-10-CM | POA: Diagnosis not present

## 2017-08-09 DIAGNOSIS — K5190 Ulcerative colitis, unspecified, without complications: Secondary | ICD-10-CM | POA: Diagnosis present

## 2017-08-09 DIAGNOSIS — E039 Hypothyroidism, unspecified: Secondary | ICD-10-CM | POA: Diagnosis not present

## 2017-08-09 DIAGNOSIS — Z800 Family history of malignant neoplasm of digestive organs: Secondary | ICD-10-CM | POA: Diagnosis not present

## 2017-08-09 DIAGNOSIS — J9811 Atelectasis: Secondary | ICD-10-CM | POA: Diagnosis not present

## 2017-08-09 DIAGNOSIS — I2582 Chronic total occlusion of coronary artery: Secondary | ICD-10-CM | POA: Diagnosis present

## 2017-08-09 DIAGNOSIS — Z807 Family history of other malignant neoplasms of lymphoid, hematopoietic and related tissues: Secondary | ICD-10-CM | POA: Diagnosis not present

## 2017-08-09 DIAGNOSIS — Z8249 Family history of ischemic heart disease and other diseases of the circulatory system: Secondary | ICD-10-CM

## 2017-08-09 DIAGNOSIS — I10 Essential (primary) hypertension: Secondary | ICD-10-CM | POA: Diagnosis present

## 2017-08-09 DIAGNOSIS — I25110 Atherosclerotic heart disease of native coronary artery with unstable angina pectoris: Secondary | ICD-10-CM | POA: Diagnosis not present

## 2017-08-09 DIAGNOSIS — I454 Nonspecific intraventricular block: Secondary | ICD-10-CM | POA: Diagnosis present

## 2017-08-09 DIAGNOSIS — I361 Nonrheumatic tricuspid (valve) insufficiency: Secondary | ICD-10-CM | POA: Diagnosis not present

## 2017-08-09 DIAGNOSIS — E7800 Pure hypercholesterolemia, unspecified: Secondary | ICD-10-CM | POA: Diagnosis not present

## 2017-08-09 DIAGNOSIS — Z01810 Encounter for preprocedural cardiovascular examination: Secondary | ICD-10-CM | POA: Diagnosis not present

## 2017-08-09 DIAGNOSIS — I452 Bifascicular block: Secondary | ICD-10-CM | POA: Diagnosis not present

## 2017-08-09 DIAGNOSIS — I209 Angina pectoris, unspecified: Secondary | ICD-10-CM

## 2017-08-09 DIAGNOSIS — R079 Chest pain, unspecified: Secondary | ICD-10-CM | POA: Diagnosis not present

## 2017-08-09 HISTORY — PX: LEFT HEART CATH AND CORONARY ANGIOGRAPHY: CATH118249

## 2017-08-09 LAB — PULMONARY FUNCTION TEST
FEF 25-75 POST: 2.25 L/s
FEF 25-75 PRE: 1.95 L/s
FEF2575-%Change-Post: 15 %
FEF2575-%PRED-PRE: 80 %
FEF2575-%Pred-Post: 92 %
FEV1-%Change-Post: 4 %
FEV1-%PRED-POST: 109 %
FEV1-%PRED-PRE: 104 %
FEV1-POST: 3.59 L
FEV1-Pre: 3.44 L
FEV1FVC-%Change-Post: 6 %
FEV1FVC-%PRED-PRE: 93 %
FEV6-%CHANGE-POST: -6 %
FEV6-%Pred-Post: 105 %
FEV6-%Pred-Pre: 112 %
FEV6-Post: 4.46 L
FEV6-Pre: 4.76 L
FEV6FVC-%CHANGE-POST: -4 %
FEV6FVC-%Pred-Post: 96 %
FEV6FVC-%Pred-Pre: 100 %
FVC-%Change-Post: -1 %
FVC-%Pred-Post: 110 %
FVC-%Pred-Pre: 111 %
FVC-PRE: 5.03 L
FVC-Post: 4.94 L
POST FEV6/FVC RATIO: 90 %
PRE FEV1/FVC RATIO: 68 %
Post FEV1/FVC ratio: 73 %
Pre FEV6/FVC Ratio: 95 %

## 2017-08-09 SURGERY — LEFT HEART CATH AND CORONARY ANGIOGRAPHY
Anesthesia: LOCAL

## 2017-08-09 MED ORDER — POLYETHYLENE GLYCOL 3350 17 G PO PACK
17.0000 g | PACK | Freq: Every day | ORAL | Status: DC
  Administered 2017-08-10 – 2017-08-11 (×2): 17 g via ORAL
  Filled 2017-08-09 (×3): qty 1

## 2017-08-09 MED ORDER — MIDAZOLAM HCL 2 MG/2ML IJ SOLN
INTRAMUSCULAR | Status: DC | PRN
  Administered 2017-08-09: 2 mg via INTRAVENOUS

## 2017-08-09 MED ORDER — SODIUM CHLORIDE 0.9% FLUSH
3.0000 mL | INTRAVENOUS | Status: DC | PRN
Start: 2017-08-09 — End: 2017-08-12

## 2017-08-09 MED ORDER — HEPARIN (PORCINE) IN NACL 2-0.9 UNIT/ML-% IJ SOLN
INTRAMUSCULAR | Status: DC | PRN
  Administered 2017-08-09: 10 mL via INTRA_ARTERIAL

## 2017-08-09 MED ORDER — HEPARIN SODIUM (PORCINE) 1000 UNIT/ML IJ SOLN
INTRAMUSCULAR | Status: DC | PRN
  Administered 2017-08-09: 5000 [IU] via INTRAVENOUS

## 2017-08-09 MED ORDER — ADULT MULTIVITAMIN W/MINERALS CH
1.0000 | ORAL_TABLET | Freq: Every day | ORAL | Status: DC
  Administered 2017-08-10 – 2017-08-11 (×2): 1 via ORAL
  Filled 2017-08-09 (×3): qty 1

## 2017-08-09 MED ORDER — SULFASALAZINE 500 MG PO TABS
2000.0000 mg | ORAL_TABLET | Freq: Three times a day (TID) | ORAL | Status: DC
  Administered 2017-08-09 – 2017-08-11 (×7): 2000 mg via ORAL
  Filled 2017-08-09 (×9): qty 4

## 2017-08-09 MED ORDER — SODIUM CHLORIDE 0.9 % IV SOLN: 250.0000 mL | INTRAVENOUS | Status: DC | PRN

## 2017-08-09 MED ORDER — ATORVASTATIN CALCIUM 20 MG PO TABS
20.0000 mg | ORAL_TABLET | Freq: Every day | ORAL | Status: DC
  Filled 2017-08-09: qty 1

## 2017-08-09 MED ORDER — ALBUTEROL SULFATE (2.5 MG/3ML) 0.083% IN NEBU
2.5000 mg | INHALATION_SOLUTION | Freq: Once | RESPIRATORY_TRACT | Status: AC
  Administered 2017-08-09: 2.5 mg via RESPIRATORY_TRACT

## 2017-08-09 MED ORDER — HEPARIN (PORCINE) IN NACL 2-0.9 UNIT/ML-% IJ SOLN
INTRAMUSCULAR | Status: AC
  Filled 2017-08-09: qty 1000

## 2017-08-09 MED ORDER — ASPIRIN EC 81 MG PO TBEC
81.0000 mg | DELAYED_RELEASE_TABLET | Freq: Every day | ORAL | Status: DC
  Administered 2017-08-10 – 2017-08-11 (×2): 81 mg via ORAL
  Filled 2017-08-09 (×2): qty 1

## 2017-08-09 MED ORDER — SODIUM CHLORIDE 0.9 % WEIGHT BASED INFUSION
3.0000 mL/kg/h | INTRAVENOUS | Status: DC
  Administered 2017-08-09: 3 mL/kg/h via INTRAVENOUS

## 2017-08-09 MED ORDER — IOPAMIDOL (ISOVUE-370) INJECTION 76%
INTRAVENOUS | Status: DC | PRN
  Administered 2017-08-09: 85 mL via INTRA_ARTERIAL

## 2017-08-09 MED ORDER — HEPARIN SODIUM (PORCINE) 1000 UNIT/ML IJ SOLN
INTRAMUSCULAR | Status: AC
  Filled 2017-08-09: qty 1

## 2017-08-09 MED ORDER — OXYCODONE HCL 5 MG PO TABS: 5.0000 mg | ORAL_TABLET | ORAL | Status: DC | PRN

## 2017-08-09 MED ORDER — BENAZEPRIL HCL 40 MG PO TABS
40.0000 mg | ORAL_TABLET | Freq: Every day | ORAL | Status: DC
  Administered 2017-08-10 – 2017-08-11 (×2): 40 mg via ORAL
  Filled 2017-08-09 (×3): qty 1

## 2017-08-09 MED ORDER — ONDANSETRON HCL 4 MG/2ML IJ SOLN: 4.0000 mg | Freq: Four times a day (QID) | INTRAMUSCULAR | Status: DC | PRN

## 2017-08-09 MED ORDER — FENTANYL CITRATE (PF) 100 MCG/2ML IJ SOLN
INTRAMUSCULAR | Status: AC
Start: 2017-08-09 — End: 2017-08-09
  Filled 2017-08-09: qty 2

## 2017-08-09 MED ORDER — SODIUM CHLORIDE 0.9% FLUSH
3.0000 mL | Freq: Two times a day (BID) | INTRAVENOUS | Status: DC
  Administered 2017-08-09 – 2017-08-11 (×6): 3 mL via INTRAVENOUS

## 2017-08-09 MED ORDER — ACETAMINOPHEN 325 MG PO TABS: 650.0000 mg | ORAL_TABLET | ORAL | Status: DC | PRN

## 2017-08-09 MED ORDER — AMLODIPINE BESYLATE 5 MG PO TABS
10.0000 mg | ORAL_TABLET | Freq: Every day | ORAL | Status: DC
  Administered 2017-08-10 – 2017-08-11 (×2): 10 mg via ORAL
  Filled 2017-08-09 (×2): qty 2

## 2017-08-09 MED ORDER — FENTANYL CITRATE (PF) 100 MCG/2ML IJ SOLN
INTRAMUSCULAR | Status: DC | PRN
  Administered 2017-08-09: 25 ug via INTRAVENOUS

## 2017-08-09 MED ORDER — SODIUM CHLORIDE 0.9% FLUSH: 3.0000 mL | INTRAVENOUS | Status: DC | PRN

## 2017-08-09 MED ORDER — HYDROCHLOROTHIAZIDE 25 MG PO TABS
25.0000 mg | ORAL_TABLET | Freq: Every day | ORAL | Status: DC
  Administered 2017-08-09 – 2017-08-11 (×3): 25 mg via ORAL
  Filled 2017-08-09 (×3): qty 1

## 2017-08-09 MED ORDER — ASPIRIN 81 MG PO CHEW: 81.0000 mg | CHEWABLE_TABLET | ORAL | Status: DC

## 2017-08-09 MED ORDER — SODIUM CHLORIDE 0.9% FLUSH: 3.0000 mL | Freq: Two times a day (BID) | INTRAVENOUS | Status: DC

## 2017-08-09 MED ORDER — VERAPAMIL HCL 2.5 MG/ML IV SOLN
INTRAVENOUS | Status: AC
  Filled 2017-08-09: qty 2

## 2017-08-09 MED ORDER — HEPARIN (PORCINE) IN NACL 2-0.9 UNIT/ML-% IJ SOLN
INTRAMUSCULAR | Status: AC | PRN
  Administered 2017-08-09 (×2): 500 mL

## 2017-08-09 MED ORDER — LIDOCAINE HCL (PF) 1 % IJ SOLN
INTRAMUSCULAR | Status: AC
  Filled 2017-08-09: qty 30

## 2017-08-09 MED ORDER — NITROGLYCERIN 0.4 MG SL SUBL: 0.4000 mg | SUBLINGUAL_TABLET | SUBLINGUAL | Status: DC | PRN

## 2017-08-09 MED ORDER — IOPAMIDOL (ISOVUE-370) INJECTION 76%
INTRAVENOUS | Status: AC
  Filled 2017-08-09: qty 100

## 2017-08-09 MED ORDER — LIDOCAINE HCL (PF) 1 % IJ SOLN
INTRAMUSCULAR | Status: DC | PRN
  Administered 2017-08-09: 2 mL

## 2017-08-09 MED ORDER — ATENOLOL 25 MG PO TABS
25.0000 mg | ORAL_TABLET | Freq: Every day | ORAL | Status: DC
  Administered 2017-08-10 – 2017-08-11 (×2): 25 mg via ORAL
  Filled 2017-08-09 (×3): qty 1

## 2017-08-09 MED ORDER — SODIUM CHLORIDE 0.9 % WEIGHT BASED INFUSION: 1.0000 mL/kg/h | INTRAVENOUS | Status: DC

## 2017-08-09 MED ORDER — SODIUM CHLORIDE 0.9 % IV SOLN
INTRAVENOUS | Status: AC
  Administered 2017-08-09: 75 mL/h via INTRAVENOUS

## 2017-08-09 MED ORDER — MIDAZOLAM HCL 2 MG/2ML IJ SOLN
INTRAMUSCULAR | Status: AC
  Filled 2017-08-09: qty 2

## 2017-08-09 SURGICAL SUPPLY — 11 items
BAND ZEPHYR COMPRESS 30 LONG (HEMOSTASIS) ×2 IMPLANT
CATH IMPULSE 5F ANG/FL3.5 (CATHETERS) ×2 IMPLANT
GUIDEWIRE INQWIRE 1.5J.035X260 (WIRE) ×1 IMPLANT
INQWIRE 1.5J .035X260CM (WIRE) ×2
KIT HEART LEFT (KITS) ×2 IMPLANT
NEEDLE PERC 21GX4CM (NEEDLE) ×2 IMPLANT
PACK CARDIAC CATHETERIZATION (CUSTOM PROCEDURE TRAY) ×2 IMPLANT
SHEATH RAIN RADIAL 21G 6FR (SHEATH) ×2 IMPLANT
SYR MEDRAD MARK V 150ML (SYRINGE) ×2 IMPLANT
TRANSDUCER W/STOPCOCK (MISCELLANEOUS) ×2 IMPLANT
TUBING CIL FLEX 10 FLL-RA (TUBING) ×2 IMPLANT

## 2017-08-09 NOTE — Interval H&P Note (Signed)
History and Physical Interval Note:  08/09/2017 7:18 AM  Stuart Gentry  has presented today for cardiac cath with the diagnosis of abnormal stress test, angina. The various methods of treatment have been discussed with the patient and family. After consideration of risks, benefits and other options for treatment, the patient has consented to  Procedure(s): LEFT HEART CATH AND CORONARY ANGIOGRAPHY (N/A) as a surgical intervention .  The patient's history has been reviewed, patient examined, no change in status, stable for surgery.  I have reviewed the patient's chart and labs.  Questions were answered to the patient's satisfaction.    Cath Lab Visit (complete for each Cath Lab visit)  Clinical Evaluation Leading to the Procedure:   ACS: No.  Non-ACS:    Anginal Classification: CCS II  Anti-ischemic medical therapy: Maximal Therapy (2 or more classes of medications)  Non-Invasive Test Results: Intermediate-risk stress test findings: cardiac mortality 1-3%/year  Prior CABG: No previous CABG         [name]

## 2017-08-09 NOTE — Progress Notes (Signed)
Zephyr BAND REMOVAL  LOCATION:    Right radial   DEFLATED PER PROTOCOL:    Yes.    TIME BAND OFF / DRESSING APPLIED:    1210pm clean dry dressing with gauze and tegaderm   SITE UPON ARRIVAL:    Level 0  SITE AFTER BAND REMOVAL:    Level 0  CIRCULATION SENSATION AND MOVEMENT:    Within Normal Limits   Yes.    COMMENTS:   Pt denies any discomfort and able to move  All fingers without any problems.

## 2017-08-09 NOTE — Consult Note (Addendum)
[address]       [place] [address]             [phone]        Trever Thomas [address] Medical Record #[id number] Date of Birth: 02/09/1946  Referring: No ref. provider found Primary Care: [name], MD Primary Cardiologist:No primary care provider on file.  Chief Complaint: Chest pain  History of Present Illness:    The patient is a 72 year old male who was recently referred to Dr. [name] in cardiology consultation for further evaluation of chest pain.  Patient had episode approximately 4 months ago where he developed significant dyspnea with exertion associated with chest tightness with some radiation into his left arm.  He saw his primary care physician who evaluated him and recommended stress testing.  He has several cardiac risk factors including hypertension and hyperlipidemia.  A recent CT scan of the abdomen showed atherosclerosis in the aorta with findings also of coronary artery calcium.  He underwent a Myoview stress test which revealed findings consistent with ischemia in the LAD territory.  He was scheduled for cardiac catheterization on today's date.  He was found to have severe three-vessel coronary artery disease (please see catheterization report below).  Cardiothoracic surgery has been consulted for consideration of CABG.    Current Activity/ Functional Status: Patient is independent with mobility/ambulation, transfers, ADL's, IADL's.   Zubrod Score: At the time of surgery this patient's most appropriate activity status/level should be described as: []     0    Normal activity, no symptoms [x]     1    Restricted in physical strenuous activity but ambulatory, able to do out light work []     2    Ambulatory and capable of self care, unable to do work activities, up and about                 more than 50%  Of the time                            []     3    Only limited self care, in bed greater than 50% of waking hours []     4     Completely disabled, no self care, confined to bed or chair []     5    Moribund  Past Medical History:  Diagnosis Date  . Chronic kidney disease    kidney stones?  . Coronary artery calcification seen on CAT scan   . GERD (gastroesophageal reflux disease)   . Hypercholesteremia   . Hypertension   . Hypothyroidism    no meds  . Sleep apnea    never been tested  . Ulcerative colitis     Past Surgical History:  Procedure Laterality Date  . COLONOSCOPY    . SKIN TAG REMOVAL Right 2018   underarm    Social History   Tobacco Use  Smoking Status Never Smoker  Smokeless Tobacco Never Used    Social History   Substance and Sexual Activity  Alcohol Use Yes   Comment: 1-2 cans beer monthly    Social History   Socioeconomic History  . Marital status: Married    Spouse name: Not on file  . Number of children: 3  . Years of education: Not on file  . Highest education level: Not on file  Occupational History  . Occupation: Retired  Scientific laboratory technician  .  Financial resource strain: Not on file  . Food insecurity:    Worry: Not on file    Inability: Not on file  . Transportation needs:    Medical: Not on file    Non-medical: Not on file  Tobacco Use  . Smoking status: Never Smoker  . Smokeless tobacco: Never Used  Substance and Sexual Activity  . Alcohol use: Yes    Comment: 1-2 cans beer monthly  . Drug use: No  . Sexual activity: Not on file  Lifestyle  . Physical activity:    Days per week: Not on file    Minutes per session: Not on file  . Stress: Not on file  Relationships  . Social connections:    Talks on phone: Not on file    Gets together: Not on file    Attends religious service: Not on file    Active member of club or organization: Not on file    Attends meetings of clubs or organizations: Not on file    Relationship status: Not on file  . Intimate partner violence:    Fear of current or ex partner: Not on file    Emotionally abused: Not on file     Physically abused: Not on file    Forced sexual activity: Not on file  Other Topics Concern  . Not on file  Social History Narrative  . Not on file    No Known Allergies  Current Facility-Administered Medications  Medication Dose Route Frequency Provider Last Rate Last Dose  . 0.9 %  sodium chloride infusion  250 mL Intravenous PRN Josue Hector, MD      . 0.9 %  sodium chloride infusion   Intravenous Continuous Burnell Blanks, MD 75 mL/hr at 08/09/17 1004 75 mL/hr at 08/09/17 1004  . 0.9% sodium chloride infusion  1 mL/kg/hr Intravenous Continuous Josue Hector, MD 93.9 mL/hr at 08/09/17 0738 1 mL/kg/hr at 08/09/17 0738  . aspirin chewable tablet 81 mg  81 mg Oral Pre-Cath Josue Hector, MD      . sodium chloride flush (NS) 0.9 % injection 3 mL  3 mL Intravenous Q12H Josue Hector, MD      . sodium chloride flush (NS) 0.9 % injection 3 mL  3 mL Intravenous PRN Josue Hector, MD        Medications Prior to Admission  Medication Sig Dispense Refill Last Dose  . amLODipine (NORVASC) 10 MG tablet TAKE 1 TABLET BY MOUTH  DAILY 90 tablet 0 08/09/2017 at 0430  . aspirin EC 81 MG tablet Take 1 tablet (81 mg total) by mouth daily.   08/09/2017 at 0430  . atenolol (TENORMIN) 25 MG tablet TAKE 1 TABLET BY MOUTH  DAILY 90 tablet 0 08/09/2017 at 0430  . atorvastatin (LIPITOR) 20 MG tablet TAKE 1 TABLET BY MOUTH  EVERY DAY 90 tablet 1 08/09/2017 at 0430  . benazepril (LOTENSIN) 40 MG tablet TAKE 1 TABLET BY MOUTH  DAILY 90 tablet 0 08/09/2017 at 0430  . esomeprazole (NEXIUM) 20 MG capsule Take 20 mg by mouth every morning.    08/08/2017 at 0900  . hydrochlorothiazide (HYDRODIURIL) 25 MG tablet Take 1 tablet (25 mg total) by mouth daily. 90 tablet 3 08/08/2017 at 0900  . Multiple Vitamin (MULTIVITAMIN) tablet Take 1 tablet by mouth daily.   08/09/2017 at 0430  . nitroGLYCERIN (NITROSTAT) 0.4 MG SL tablet Place 1 tablet (0.4 mg total) under the tongue every 5 (five) minutes as needed  for  chest pain. 25 tablet 3 not yet  . polyethylene glycol (MIRALAX / GLYCOLAX) packet Take 17 g by mouth daily.    Past Week at Unknown time  . sulfaSALAzine (AZULFIDINE) 500 MG tablet TAKE 4 TABLETS BY MOUTH 3  TIMES DAILY 1080 tablet 11 08/09/2017 at 0430  . mesalamine (ROWASA) 4 g enema Place 60 mLs (4 g total) rectally at bedtime. Place 60 mls rectally at bedtime (Patient taking differently: Place 4 g rectally at bedtime as needed. Place 60 mls rectally at bedtime) 90 Bottle 1 More than a month at Unknown time    Family History  Problem Relation Age of Onset  . Liver cancer Mother   . Heart disease Brother   . Lymphoma Brother   . Kidney disease Brother   . Colon cancer Neg Hx    LEFT HEART CATH AND CORONARY ANGIOGRAPHY  Conclusion     Prox RCA to Mid RCA lesion is 50% stenosed.  Mid RCA-1 lesion is 80% stenosed.  Mid RCA-2 lesion is 60% stenosed.  Mid RCA to Dist RCA lesion is 20% stenosed.  Mid Cx lesion is 99% stenosed.  Ost 2nd Mrg lesion is 99% stenosed.  Ost 1st Mrg lesion is 90% stenosed.  Ost Cx lesion is 50% stenosed.  Prox Cx lesion is 50% stenosed.  Prox LAD lesion is 100% stenosed.  Ost LAD to Prox LAD lesion is 20% stenosed.  Ost 1st Diag lesion is 90% stenosed.  The left ventricular systolic function is normal.  LV end diastolic pressure is normal.  The left ventricular ejection fraction is 50-55% by visual estimate.  There is no mitral valve regurgitation.   1. Severe triple vessel CAD with CTO of the mid LAD. The mid LAD fills from right to left collaterals.  2. High grade stenosis in the mid segment of the large, dominant RCA 3. High grade stenosis in the mid Circumflex, OM1 and OM2.  4. High grade stenosis large Diagonal branch.  5. Preserved LV systolic function  Recommendations: Will admit to telemetry. CT surgery consult for CABG. I will arrange an echo. Continue statin, ASA and beta blocker.    Indications   Coronary artery disease  involving native coronary artery of native heart with unstable angina pectoris ([place]) [I25.110 (ICD-10-CM)]  Procedural Details/Technique   Technical Details Indication: 72 yo male with history of GERD, HTN, HLD, ulcerative colitis and CAD noted on chest CT with recent episodes of chest pain concerning for angina. Intermediate risk nuclear stress test with anterior wall ischemia.   Procedure: The risks, benefits, complications, treatment options, and expected outcomes were discussed with the patient. The patient and/or family concurred with the proposed plan, giving informed consent. The patient was brought to the cath lab after IV hydration was given. The patient was further sedated with Versed and Fentanyl. The right wrist was prepped and draped in a sterile fashion. 1% lidocaine was used for local anesthesia. Using the modified Seldinger access technique, a 5 French sheath was placed in the right radial artery. 3 mg Verapamil was given through the sheath. 4000 units IV heparin was given. Standard diagnostic catheters were used to perform selective coronary angiography. A pigtail catheter was used to perform a left ventricular angiogram. The sheath was removed from the right radial artery and a Zephyr hemostasis band was applied at the arteriotomy site on the right wrist.     Estimated blood loss <50 mL.  During this procedure the patient was administered the following to  achieve and maintain moderate conscious sedation: Versed 2 mg, Fentanyl 25 mcg, while the patient's heart rate, blood pressure, and oxygen saturation were continuously monitored. The period of conscious sedation was 22 minutes, of which I was present face-to-face 100% of this time.  Complications   Complications documented before study signed (08/09/2017 8:20 AM EDT)    LEFT HEART CATH AND CORONARY ANGIOGRAPHY   None Documented by Burnell Blanks, MD 08/09/2017 8:19 AM EDT  Time Range: Intraprocedure      Coronary  Findings   Diagnostic  Dominance: Right  Left Anterior Descending  Vessel is large.  Ost LAD to Prox LAD lesion 20% stenosed  Ost LAD to Prox LAD lesion is 20% stenosed. The lesion is calcified.  Prox LAD lesion 100% stenosed  Prox LAD lesion is 100% stenosed. The lesion is chronically occluded.  First Diagonal Branch  Vessel is large in size.  Ost 1st Diag lesion 90% stenosed  Ost 1st Diag lesion is 90% stenosed.  Third Septal Branch  Collaterals  3rd Sept filled by collaterals from [place].    Left Circumflex  Ost Cx lesion 50% stenosed  Ost Cx lesion is 50% stenosed.  Prox Cx lesion 50% stenosed  Prox Cx lesion is 50% stenosed. The lesion is calcified.  Mid Cx lesion 99% stenosed  Mid Cx lesion is 99% stenosed.  First Obtuse Marginal Branch  Vessel is moderate in size.  Ost 1st Mrg lesion 90% stenosed  Ost 1st Mrg lesion is 90% stenosed.  Second Obtuse Marginal Branch  Vessel is moderate in size.  Ost 2nd Mrg lesion 99% stenosed  Ost 2nd Mrg lesion is 99% stenosed.  Right Coronary Artery  Vessel is large.  Prox RCA to Mid RCA lesion 50% stenosed  Prox RCA to Mid RCA lesion is 50% stenosed. The lesion is calcified.  Mid RCA-1 lesion 80% stenosed  Mid RCA-1 lesion is 80% stenosed. The lesion is calcified.  Mid RCA-2 lesion 60% stenosed  Mid RCA-2 lesion is 60% stenosed. The lesion is calcified.  Mid RCA to Dist RCA lesion 20% stenosed  Mid RCA to Dist RCA lesion is 20% stenosed. The lesion is calcified.  Right Posterior Descending Artery  Vessel is large in size.  Right Posterior Atrioventricular Branch  Vessel is moderate in size.  Intervention   No interventions have been documented.  Wall Motion              Left Heart   Left Ventricle The left ventricular size is normal. The left ventricular systolic function is normal. LV end diastolic pressure is normal. The left ventricular ejection fraction is 50-55% by visual estimate. There are LV function  abnormalities due to segmental dysfunction. There is no evidence of mitral regurgitation.  Coronary Diagrams   Diagnostic Diagram          Review of Systems:   Review of Systems  Constitutional: Positive for diaphoresis. Negative for chills, fever, malaise/fatigue and weight loss.  HENT: Positive for tinnitus. Negative for congestion, ear discharge, ear pain, hearing loss, nosebleeds and sinus pain.   Eyes: Negative for blurred vision, double vision, photophobia, pain, discharge and redness.  Respiratory: Positive for shortness of breath. Negative for cough, hemoptysis, sputum production, wheezing and stridor.   Cardiovascular: Positive for chest pain. Negative for palpitations, orthopnea, claudication, leg swelling and PND.  Gastrointestinal: Positive for abdominal pain, blood in stool, diarrhea, heartburn and vomiting. Negative for constipation, melena and nausea.       Gets Ulceralative colitis  flares  Genitourinary: Positive for frequency. Negative for dysuria, flank pain, hematuria and urgency.  Musculoskeletal: Negative.   Skin: Positive for rash. Negative for itching.       Herpetic- groin region  Neurological: Positive for tremors and seizures. Negative for dizziness, tingling, sensory change, speech change, focal weakness, loss of consciousness, weakness and headaches.  Endo/Heme/Allergies: Negative for environmental allergies and polydipsia. Does not bruise/bleed easily.  Psychiatric/Behavioral: Negative for depression, hallucinations, memory loss, substance abuse and suicidal ideas. The patient is not nervous/anxious and does not have insomnia.        Physical Exam: BP (!) 138/105   Pulse (!) 58   Temp 97.7 F (36.5 C) (Oral)   Resp 20   Ht 5' 11"  (1.803 m)   Wt 207 lb (93.9 kg)   SpO2 99%   BMI 28.87 kg/m    Physical Exam  Constitutional: He is oriented to person, place, and time. He appears well-developed and well-nourished. No distress.  HENT:  Head:  Normocephalic and atraumatic.  Nose: Nose normal.  Mouth/Throat: Oropharynx is clear and moist. No oropharyngeal exudate.  Eyes: Pupils are equal, round, and reactive to light. Conjunctivae and EOM are normal. Right eye exhibits no discharge. Left eye exhibits no discharge. No scleral icterus.  Neck: No JVD present. No tracheal deviation present. No thyromegaly present.  Cardiovascular: Normal rate, regular rhythm, normal heart sounds and intact distal pulses. Exam reveals no gallop and no friction rub.  No murmur heard. Normal palpable pulses  Pulmonary/Chest: Effort normal and breath sounds normal. No stridor. No respiratory distress. He has no wheezes. He has no rales. He exhibits no tenderness.  Abdominal: Soft. Bowel sounds are normal. He exhibits no distension and no mass. There is no tenderness. There is no rebound and no guarding.  Musculoskeletal: Normal range of motion. He exhibits no edema, tenderness or deformity.  Lymphadenopathy:    He has no cervical adenopathy.  Neurological: He is alert and oriented to person, place, and time. He exhibits normal muscle tone.  Skin: Skin is warm and dry. No rash noted. He is not diaphoretic. No erythema. No pallor.  Psychiatric: He has a normal mood and affect. His behavior is normal. Thought content normal.  Vitals reviewed.   Diagnostic Studies & Laboratory data:     Recent Radiology Findings:   No results found.   I have independently reviewed the above radiologic studies.  Recent Lab Findings: Lab Results  Component Value Date   WBC 5.5 08/06/2017   HGB 11.1 (L) 08/06/2017   HCT 32.1 (L) 08/06/2017   PLT 230 08/06/2017   GLUCOSE 110 (H) 08/06/2017   CHOL 155 06/11/2017   TRIG 90 06/11/2017   HDL 47 06/11/2017   LDLCALC 90 06/11/2017   ALT 15 06/11/2017   AST 36 (H) 06/11/2017   NA 141 08/06/2017   K 4.8 08/06/2017   CL 102 08/06/2017   CREATININE 1.08 08/06/2017   BUN 18 08/06/2017   CO2 24 08/06/2017   TSH 6.57 (H)  06/11/2017   INR 1.0 08/06/2017      Assessment / Plan: Severe three-vessel coronary artery disease. Plan for CABG        I  spent 60 minutes counseling the patient face to face.   Wendell Giovanni, PA-C 08/09/2017 2:23 PM    Chart reviewed, patient examined, agree with above. He has severe 3-vessel coronary artery disease with preserved LV function. His LM and proximal vessels are heavily calcified. Echo today shows an  EF of 60% with no significant valvular disease. I agree that the best treatment is CABG. I discussed the operative procedure with the patient and his wife and son including alternatives, benefits and risks; including but not limited to bleeding, blood transfusion, infection, stroke, myocardial infarction, graft failure, heart block requiring a permanent pacemaker, organ dysfunction, and death.  Tori Milks understands and agrees to proceed.  Plan CABG on Monday am.

## 2017-08-09 NOTE — Plan of Care (Signed)
  Problem: Education: Goal: Knowledge of General Education information will improve Outcome: Progressing   Problem: Health Behavior/Discharge Planning: Goal: Ability to manage health-related needs will improve Outcome: Progressing   

## 2017-08-10 ENCOUNTER — Encounter (HOSPITAL_COMMUNITY): Payer: Self-pay

## 2017-08-10 ENCOUNTER — Other Ambulatory Visit: Payer: Self-pay

## 2017-08-10 ENCOUNTER — Inpatient Hospital Stay (HOSPITAL_COMMUNITY): Payer: Medicare Other

## 2017-08-10 DIAGNOSIS — I361 Nonrheumatic tricuspid (valve) insufficiency: Secondary | ICD-10-CM

## 2017-08-10 DIAGNOSIS — I10 Essential (primary) hypertension: Secondary | ICD-10-CM

## 2017-08-10 DIAGNOSIS — Z01810 Encounter for preprocedural cardiovascular examination: Secondary | ICD-10-CM

## 2017-08-10 DIAGNOSIS — E7800 Pure hypercholesterolemia, unspecified: Secondary | ICD-10-CM

## 2017-08-10 DIAGNOSIS — I452 Bifascicular block: Secondary | ICD-10-CM

## 2017-08-10 LAB — BASIC METABOLIC PANEL
Anion gap: 6 (ref 5–15)
BUN: 18 mg/dL (ref 6–20)
CHLORIDE: 109 mmol/L (ref 101–111)
CO2: 23 mmol/L (ref 22–32)
Calcium: 9 mg/dL (ref 8.9–10.3)
Creatinine, Ser: 1.06 mg/dL (ref 0.61–1.24)
GFR calc Af Amer: >60 mL/min (ref >60)
GFR calc non Af Amer: >60 mL/min (ref >60)
GLUCOSE: 108 mg/dL — AB (ref 65–99)
POTASSIUM: 3.7 mmol/L (ref 3.5–5.1)
Sodium: 138 mmol/L (ref 135–145)

## 2017-08-10 LAB — CBC
HCT: 31.1 % — ABNORMAL LOW (ref 39.0–52.0)
Hemoglobin: 10.2 g/dL — ABNORMAL LOW (ref 13.0–17.0)
MCH: 31.4 pg (ref 26.0–34.0)
MCHC: 32.8 g/dL (ref 30.0–36.0)
MCV: 95.7 fL (ref 78.0–100.0)
Platelets: 201 10*3/uL (ref 150–400)
RBC: 3.25 MIL/uL — ABNORMAL LOW (ref 4.22–5.81)
RDW: 13 % (ref 11.5–15.5)
WBC: 4.6 10*3/uL (ref 4.0–10.5)

## 2017-08-10 LAB — ECHOCARDIOGRAM COMPLETE
HEIGHTINCHES: 71 in
Weight: 3217.6 oz

## 2017-08-10 LAB — SURGICAL PCR SCREEN
MRSA, PCR: NEGATIVE
Staphylococcus aureus: NEGATIVE

## 2017-08-10 MED ORDER — SODIUM CHLORIDE 0.9 % IV SOLN
1.5000 g | INTRAVENOUS | Status: DC
  Filled 2017-08-10: qty 1.5

## 2017-08-10 MED ORDER — DEXMEDETOMIDINE HCL IN NACL 400 MCG/100ML IV SOLN
0.1000 ug/kg/h | INTRAVENOUS | Status: DC
  Filled 2017-08-10: qty 100

## 2017-08-10 MED ORDER — EPINEPHRINE PF 1 MG/ML IJ SOLN
0.0000 ug/min | INTRAVENOUS | Status: DC
  Filled 2017-08-10: qty 4

## 2017-08-10 MED ORDER — SODIUM CHLORIDE 0.9 % IV SOLN
750.0000 mg | INTRAVENOUS | Status: AC
  Administered 2017-08-12: 750 mg via INTRAVENOUS
  Filled 2017-08-10: qty 750

## 2017-08-10 MED ORDER — MAGNESIUM SULFATE 50 % IJ SOLN
40.0000 meq | INTRAMUSCULAR | Status: DC
  Filled 2017-08-10: qty 9.85

## 2017-08-10 MED ORDER — SODIUM CHLORIDE 0.9 % IV SOLN
INTRAVENOUS | Status: DC
  Filled 2017-08-10: qty 30

## 2017-08-10 MED ORDER — POTASSIUM CHLORIDE 2 MEQ/ML IV SOLN
80.0000 meq | INTRAVENOUS | Status: DC
  Filled 2017-08-10: qty 40

## 2017-08-10 MED ORDER — ATORVASTATIN CALCIUM 80 MG PO TABS
80.0000 mg | ORAL_TABLET | Freq: Every day | ORAL | Status: DC
  Administered 2017-08-10 – 2017-08-11 (×2): 80 mg via ORAL
  Filled 2017-08-10 (×2): qty 1

## 2017-08-10 MED ORDER — TRANEXAMIC ACID 1000 MG/10ML IV SOLN
1.5000 mg/kg/h | INTRAVENOUS | Status: DC
  Filled 2017-08-10: qty 25

## 2017-08-10 MED ORDER — TRANEXAMIC ACID (OHS) PUMP PRIME SOLUTION
2.0000 mg/kg | INTRAVENOUS | Status: DC
  Filled 2017-08-10: qty 1.82

## 2017-08-10 MED ORDER — NITROGLYCERIN IN D5W 200-5 MCG/ML-% IV SOLN
2.0000 ug/min | INTRAVENOUS | Status: AC
  Administered 2017-08-12: 16.6 ug/min via INTRAVENOUS
  Filled 2017-08-10: qty 250

## 2017-08-10 MED ORDER — MILRINONE LACTATE IN DEXTROSE 20-5 MG/100ML-% IV SOLN
0.1250 ug/kg/min | INTRAVENOUS | Status: DC
  Filled 2017-08-10: qty 100

## 2017-08-10 MED ORDER — SODIUM CHLORIDE 0.9 % IV SOLN
INTRAVENOUS | Status: DC
  Filled 2017-08-10: qty 1

## 2017-08-10 MED ORDER — DOPAMINE-DEXTROSE 3.2-5 MG/ML-% IV SOLN
0.0000 ug/kg/min | INTRAVENOUS | Status: DC
  Filled 2017-08-10: qty 250

## 2017-08-10 MED ORDER — PLASMA-LYTE 148 IV SOLN
INTRAVENOUS | Status: DC
  Filled 2017-08-10: qty 2.5

## 2017-08-10 MED ORDER — SODIUM CHLORIDE 0.9 % IV SOLN
30.0000 ug/min | INTRAVENOUS | Status: DC
  Filled 2017-08-10: qty 2

## 2017-08-10 MED ORDER — VANCOMYCIN HCL 10 G IV SOLR
1250.0000 mg | INTRAVENOUS | Status: AC
  Administered 2017-08-12: 1250 mg via INTRAVENOUS
  Filled 2017-08-10: qty 1250

## 2017-08-10 NOTE — Progress Notes (Signed)
Progress Note  Patient Name: Stuart Gentry Date of Encounter: 08/10/2017  Primary Cardiologist: No primary care provider on file.   Subjective   No chest pain overnight.  Inpatient Medications    Scheduled Meds: . amLODipine  10 mg Oral Daily  . aspirin EC  81 mg Oral Daily  . atenolol  25 mg Oral Daily  . atorvastatin  20 mg Oral Daily  . benazepril  40 mg Oral Daily  . hydrochlorothiazide  25 mg Oral Daily  . multivitamin with minerals  1 tablet Oral Daily  . polyethylene glycol  17 g Oral Daily  . sodium chloride flush  3 mL Intravenous Q12H  . sulfaSALAzine  2,000 mg Oral TID   Continuous Infusions: . sodium chloride     PRN Meds: sodium chloride, acetaminophen, nitroGLYCERIN, ondansetron (ZOFRAN) IV, oxyCODONE, sodium chloride flush   Vital Signs    Vitals:   08/09/17 1536 08/09/17 2025 08/10/17 0046 08/10/17 0556  BP: 132/73 137/65 121/71 125/70  Pulse: (!) 57 (!) 58 (!) 57 (!) 57  Resp: 18 18 20 18   Temp: 98 F (36.7 C) 98.3 F (36.8 C) 98.1 F (36.7 C) 97.9 F (36.6 C)  TempSrc: Oral Oral Oral Oral  SpO2: 98% 98% 93% 99%  Weight:    201 lb 1.6 oz (91.2 kg)  Height:        Intake/Output Summary (Last 24 hours) at 08/10/2017 0845 Last data filed at 08/10/2017 0616 Gross per 24 hour  Intake 480 ml  Output 750 ml  Net -270 ml   Filed Weights   08/09/17 0542 08/09/17 1535 08/10/17 0556  Weight: 207 lb (93.9 kg) 202 lb 8 oz (91.9 kg) 201 lb 1.6 oz (91.2 kg)    Telemetry    NSR and moderate sinus bradycardia at night - Personally Reviewed  ECG    NSR, RBBB, LAFB - Personally Reviewed  Physical Exam  Mildly overweight GEN: No acute distress.   Neck: No JVD Cardiac: RRR, no murmurs, rubs, or gallops.  Respiratory: Clear to auscultation bilaterally. GI: Soft, nontender, non-distended  MS: No edema; No deformity. Neuro:  Nonfocal  Psych: Normal affect   Labs    Chemistry Recent Labs  Lab 08/06/17 1325 08/10/17 0644  NA 141 138    K 4.8 3.7  CL 102 109  CO2 24 23  GLUCOSE 110* 108*  BUN 18 18  CREATININE 1.08 1.06  CALCIUM 9.9 9.0  GFRNONAA 68 >60  GFRAA 79 >60  ANIONGAP  --  6     Hematology Recent Labs  Lab 08/06/17 1325 08/10/17 0644  WBC 5.5 4.6  RBC 3.48* 3.25*  HGB 11.1* 10.2*  HCT 32.1* 31.1*  MCV 92 95.7  MCH 31.9 31.4  MCHC 34.6 32.8  RDW 13.8 13.0  PLT 230 201      Radiology    No results found.  Cardiac Studies   CATH 08/09/2017   Prox RCA to Mid RCA lesion is 50% stenosed.  Mid RCA-1 lesion is 80% stenosed.  Mid RCA-2 lesion is 60% stenosed.  Mid RCA to Dist RCA lesion is 20% stenosed.  Mid Cx lesion is 99% stenosed.  Ost 2nd Mrg lesion is 99% stenosed.  Ost 1st Mrg lesion is 90% stenosed.  Ost Cx lesion is 50% stenosed.  Prox Cx lesion is 50% stenosed.  Prox LAD lesion is 100% stenosed.  Ost LAD to Prox LAD lesion is 20% stenosed.  Ost 1st Diag lesion is 90% stenosed.  The left ventricular systolic function is normal.  LV end diastolic pressure is normal.  The left ventricular ejection fraction is 50-55% by visual estimate.  There is no mitral valve regurgitation.   1. Severe triple vessel CAD with CTO of the mid LAD. The mid LAD fills from right to left collaterals.  2. High grade stenosis in the mid segment of the large, dominant RCA 3. High grade stenosis in the mid Circumflex, OM1 and OM2.  4. High grade stenosis large Diagonal branch.  5. Preserved LV systolic function  Recommendations: Will admit to telemetry. CT surgery consult for CABG. I will arrange an echo. Continue statin, ASA and beta blocker.    Patient Profile     72 y.o. male with recent onset chest pain and severe 3 vessel CAD with borderline LVEF, referred for CABG  Assessment & Plan    1. CAD:  Seems to be an excellent candidate for CABG, Dr. Cyndia Bent to evaluate today. Tentatively scheduled for CABG Monday AM. On ASA and low dose beta blocker. 2. HLP: baseline LDL 159 a  year ago, currently 90 on therapy, target < 70. Increase atorvastatin t0 80 mg daily. 3. 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 4. HTN: well controlled  For questions or updates, please contact [place] Please consult [email or web address] for contact info under Cardiology/STEMI.      Signed, Sanda Klein, MD  08/10/2017, 8:45 AM

## 2017-08-10 NOTE — Progress Notes (Signed)
Pre-op Cardiac Surgery  Carotid Findings:  1-39% ICA stenosis.  Vertebral artery flow is antegrade.   Upper Extremity Right Left  Brachial Pressures 126T 127T  Radial Waveforms T T  Ulnar Waveforms T T  Palmar Arch (Allen's Test) Doppler signal obliterates with radial compression and remains normal with ulnar compression Doppler signal obliterates with radial compression and remains normal with ulnar compression   Findings:      Lower  Extremity Right Left  Dorsalis Pedis T T  Anterior Tibial    Posterior Tibial T T  Ankle/Brachial Indices      Findings:

## 2017-08-10 NOTE — Progress Notes (Signed)
Cardiac Rehab Pre-op Education Note: [phone] Pre-op cardiac surgery education completed including "in the tube" movement parameters, cough and deep breath, and how to get in and out of chair. Patient also given IS and demonstrated proper use along with cardiac surgery booklet. Attempted to walk with patient after education however patient was needed for pre-op testing in ultrasound. Wife stated patient had been given permission by cardiologist to ambulate in hallway independently. Will follow up with patient after surgery.

## 2017-08-10 NOTE — Progress Notes (Signed)
  Echocardiogram 2D Echocardiogram has been performed.  Stuart Gentry 08/10/2017, 12:54 PM

## 2017-08-11 ENCOUNTER — Inpatient Hospital Stay (HOSPITAL_COMMUNITY): Payer: Medicare Other

## 2017-08-11 LAB — BLOOD GAS, ARTERIAL
ACID-BASE EXCESS: 1.7 mmol/L (ref 0.0–2.0)
Bicarbonate: 25.6 mmol/L (ref 20.0–28.0)
DRAWN BY: 441371
FIO2: 21
O2 SAT: 91.5 %
Patient temperature: 98.2
pCO2 arterial: 38.8 mmHg (ref 32.0–48.0)
pH, Arterial: 7.434 (ref 7.350–7.450)
pO2, Arterial: 64.7 mmHg — ABNORMAL LOW (ref 83.0–108.0)

## 2017-08-11 LAB — URINALYSIS, ROUTINE W REFLEX MICROSCOPIC
BILIRUBIN URINE: NEGATIVE
Glucose, UA: NEGATIVE mg/dL
Hgb urine dipstick: NEGATIVE
KETONES UR: NEGATIVE mg/dL
Leukocytes, UA: NEGATIVE
NITRITE: NEGATIVE
Protein, ur: NEGATIVE mg/dL
SPECIFIC GRAVITY, URINE: 1.008 (ref 1.005–1.030)
pH: 5 (ref 5.0–8.0)

## 2017-08-11 LAB — ABO/RH: ABO/RH(D): O NEG

## 2017-08-11 MED ORDER — DIAZEPAM 5 MG PO TABS
5.0000 mg | ORAL_TABLET | Freq: Once | ORAL | Status: AC
  Administered 2017-08-12: 5 mg via ORAL
  Filled 2017-08-11: qty 1

## 2017-08-11 MED ORDER — CHLORHEXIDINE GLUCONATE 0.12 % MT SOLN
15.0000 mL | Freq: Once | OROMUCOSAL | Status: AC
  Administered 2017-08-12: 15 mL via OROMUCOSAL
  Filled 2017-08-11: qty 15

## 2017-08-11 MED ORDER — TEMAZEPAM 15 MG PO CAPS
15.0000 mg | ORAL_CAPSULE | Freq: Once | ORAL | Status: AC | PRN
  Administered 2017-08-11: 15 mg via ORAL
  Filled 2017-08-11: qty 1

## 2017-08-11 MED ORDER — CHLORHEXIDINE GLUCONATE CLOTH 2 % EX PADS
6.0000 | MEDICATED_PAD | Freq: Once | CUTANEOUS | Status: AC
  Administered 2017-08-11: 6 via TOPICAL

## 2017-08-11 MED ORDER — METOPROLOL TARTRATE 12.5 MG HALF TABLET
12.5000 mg | ORAL_TABLET | Freq: Once | ORAL | Status: AC
  Administered 2017-08-12: 12.5 mg via ORAL
  Filled 2017-08-11: qty 1

## 2017-08-11 MED ORDER — CHLORHEXIDINE GLUCONATE CLOTH 2 % EX PADS
6.0000 | MEDICATED_PAD | Freq: Once | CUTANEOUS | Status: AC
  Administered 2017-08-12: 6 via TOPICAL

## 2017-08-11 MED ORDER — BISACODYL 5 MG PO TBEC
5.0000 mg | DELAYED_RELEASE_TABLET | Freq: Once | ORAL | Status: AC
  Administered 2017-08-11: 5 mg via ORAL
  Filled 2017-08-11: qty 1

## 2017-08-11 NOTE — Progress Notes (Signed)
2 Days Post-Op Procedure(s) (LRB): LEFT HEART CATH AND CORONARY ANGIOGRAPHY (N/A) Subjective:  No chest pain or shortness of breath  Objective: Vital signs in last 24 hours: Temp:  [98 F (36.7 C)-98.5 F (36.9 C)] 98.2 F (36.8 C) (04/14 1122) Pulse Rate:  [55-61] 57 (04/14 1122) Cardiac Rhythm: Sinus bradycardia (04/14 0700) Resp:  [18] 18 (04/14 1122) BP: (116-136)/(67-71) 136/71 (04/14 1122) SpO2:  [97 %-98 %] 98 % (04/14 1122) Weight:  [91.4 kg (201 lb 6.4 oz)] 91.4 kg (201 lb 6.4 oz) (04/14 0526)  Hemodynamic parameters for last 24 hours:    Intake/Output from previous day: 04/13 0701 - 04/14 0700 In: 1104 [P.O.:1104] Out: 600 [Urine:600] Intake/Output this shift: Total I/O In: 480 [P.O.:480] Out: 400 [Urine:400]  General appearance: alert and cooperative Heart: regular rate and rhythm, S1, S2 normal, no murmur, click, rub or gallop Lungs: clear to auscultation bilaterally  Lab Results: Recent Labs    08/10/17 0644  WBC 4.6  HGB 10.2*  HCT 31.1*  PLT 201   BMET:  Recent Labs    08/10/17 0644  NA 138  K 3.7  CL 109  CO2 23  GLUCOSE 108*  BUN 18  CREATININE 1.06  CALCIUM 9.0    PT/INR: No results for input(s): LABPROT, INR in the last 72 hours. ABG No results found for: PHART, HCO3, TCO2, ACIDBASEDEF, O2SAT CBG (last 3)  No results for input(s): GLUCAP in the last 72 hours.  Assessment/Plan: Severe multi-vessel CAD. Plan CABG tomorrow. Patient and wife have no further questions.  LOS: 2 days    Gaye Pollack 08/11/2017

## 2017-08-11 NOTE — Progress Notes (Signed)
Progress Note  Patient Name: Stuart Gentry Date of Encounter: 08/11/2017  Primary Cardiologist: No primary care provider on file.   Subjective   Overnight.  completed vascular workup preop. Only complaint is constipation.  He is walking briskly in the hallways.  Inpatient Medications    Scheduled Meds: . amLODipine  10 mg Oral Daily  . aspirin EC  81 mg Oral Daily  . atenolol  25 mg Oral Daily  . atorvastatin  80 mg Oral Daily  . benazepril  40 mg Oral Daily  . [START ON 08/12/2017] heparin-papaverine-plasmalyte irrigation   Irrigation To OR  . hydrochlorothiazide  25 mg Oral Daily  . [START ON 08/12/2017] magnesium sulfate  40 mEq Other To OR  . multivitamin with minerals  1 tablet Oral Daily  . polyethylene glycol  17 g Oral Daily  . [START ON 08/12/2017] potassium chloride  80 mEq Other To OR  . sodium chloride flush  3 mL Intravenous Q12H  . sulfaSALAzine  2,000 mg Oral TID  . [START ON 08/12/2017] tranexamic acid  2 mg/kg Intracatheter To OR   Continuous Infusions: . sodium chloride    . [START ON 08/12/2017] cefUROXime (ZINACEF)  IV    . [START ON 08/12/2017] cefUROXime (ZINACEF)  IV    . [START ON 08/12/2017] dexmedetomidine    . [START ON 08/12/2017] DOPamine    . [START ON 08/12/2017] epinephrine    . [START ON 08/12/2017] heparin 30,000 units/NS 1000 mL solution for CELLSAVER    . [START ON 08/12/2017] insulin (NOVOLIN-R) infusion    . [START ON 08/12/2017] milrinone    . [START ON 08/12/2017] nitroGLYCERIN    . [START ON 08/12/2017] phenylephrine 53m/250mL NS (0.064mml) infusion    . [START ON 08/12/2017] tranexamic acid (CYKLOKAPRON) infusion ([facility])    . [START ON 08/12/2017] vancomycin     PRN Meds: sodium chloride, acetaminophen, nitroGLYCERIN, ondansetron (ZOFRAN) IV, oxyCODONE, sodium chloride flush   Vital Signs    Vitals:   08/10/17 1917 08/10/17 2355 08/11/17 0526 08/11/17 0857  BP: 125/71 127/67 116/67 132/67  Pulse: 61 (!) 55 (!) 55 61  Resp: 18 18  18    Temp: 98.1 F (36.7 C) 98.5 F (36.9 C) 98 F (36.7 C)   TempSrc: Oral Oral Oral   SpO2: 97% 97% 97%   Weight:   201 lb 6.4 oz (91.4 kg)   Height:        Intake/Output Summary (Last 24 hours) at 08/11/2017 0942 Last data filed at 08/11/2017 0900 Gross per 24 hour  Intake 1104 ml  Output 800 ml  Net 304 ml   Filed Weights   08/09/17 1535 08/10/17 0556 08/11/17 0526  Weight: 202 lb 8 oz (91.9 kg) 201 lb 1.6 oz (91.2 kg) 201 lb 6.4 oz (91.4 kg)    Telemetry    NSR - Personally Reviewed  ECG    No new tracing - Personally Reviewed  Physical Exam  Mildly overweight GEN: No acute distress.   Neck: No JVD Cardiac: RRR, no murmurs, rubs, or gallops.  Respiratory: Clear to auscultation bilaterally. GI: Soft, nontender, non-distended  MS: No edema; No deformity. Neuro:  Nonfocal  Psych: Normal affect   Labs    Chemistry Recent Labs  Lab 08/06/17 1325 08/10/17 0644  NA 141 138  K 4.8 3.7  CL 102 109  CO2 24 23  GLUCOSE 110* 108*  BUN 18 18  CREATININE 1.08 1.06  CALCIUM 9.9 9.0  GFRNONAA 68 >60  GFRAA  58 >60  ANIONGAP  --  6     Hematology Recent Labs  Lab 08/06/17 1325 08/10/17 0644  WBC 5.5 4.6  RBC 3.48* 3.25*  HGB 11.1* 10.2*  HCT 32.1* 31.1*  MCV 92 95.7  MCH 31.9 31.4  MCHC 34.6 32.8  RDW 13.8 13.0  PLT 230 201     Radiology    No results found.  Cardiac Studies   CATH 08/09/2017   Prox RCA to Mid RCA lesion is 50% stenosed.  Mid RCA-1 lesion is 80% stenosed.  Mid RCA-2 lesion is 60% stenosed.  Mid RCA to Dist RCA lesion is 20% stenosed.  Mid Cx lesion is 99% stenosed.  Ost 2nd Mrg lesion is 99% stenosed.  Ost 1st Mrg lesion is 90% stenosed.  Ost Cx lesion is 50% stenosed.  Prox Cx lesion is 50% stenosed.  Prox LAD lesion is 100% stenosed.  Ost LAD to Prox LAD lesion is 20% stenosed.  Ost 1st Diag lesion is 90% stenosed.  The left ventricular systolic function is normal.  LV end diastolic pressure is  normal.  The left ventricular ejection fraction is 50-55% by visual estimate.  There is no mitral valve regurgitation.  1. Severe triple vessel CAD with CTO of the mid LAD. The mid LAD fills from right to left collaterals.  2. High grade stenosis in the mid segment of the large, dominant RCA 3. High grade stenosis in the mid Circumflex, OM1 and OM2.  4. High grade stenosis large Diagonal branch.  5. Preserved LV systolic function  Recommendations: Will admit to telemetry. CT surgery consult for CABG. I will arrange an echo. Continue statin, ASA and beta blocker.    Patient Profile     72 y.o. male  with recent onset chest pain and severe 3 vessel CAD with borderline LVEF, referred for CABG  Assessment & Plan    1. CAD:  For CABG tomorrow. On ASA and low dose beta blocker. 2. HLP:  target LDL< 70. Increased atorvastatin to 80 mg daily. 3. 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 Will have TPW postop. 4. HTN: well controlled     For questions or updates, please contact [facility] Please consult [email or web address] for contact info under Cardiology/STEMI.      Signed, Sanda Klein, MD  08/11/2017, 9:42 AM

## 2017-08-11 NOTE — Anesthesia Preprocedure Evaluation (Addendum)
Anesthesia Evaluation  Patient identified by MRN, date of birth, ID band Patient awake    Reviewed: Allergy & Precautions, NPO status , Patient's Chart, lab work & pertinent test results, reviewed documented beta blocker date and time   Airway Mallampati: II  TM Distance: >3 FB Neck ROM: Full    Dental  (+) Dental Advisory Given   Pulmonary sleep apnea ,    breath sounds clear to auscultation       Cardiovascular hypertension, Pt. on medications and Pt. on home beta blockers + angina + CAD   Rhythm:Regular Rate:Normal     Neuro/Psych negative neurological ROS     GI/Hepatic Neg liver ROS, PUD, GERD  Medicated,  Endo/Other    Renal/GU Renal disease     Musculoskeletal   Abdominal   Peds  Hematology  (+) anemia ,   Anesthesia Other Findings   Reproductive/Obstetrics                            Lab Results  Component Value Date   WBC 4.6 08/10/2017   HGB 10.2 (L) 08/10/2017   HCT 31.1 (L) 08/10/2017   MCV 95.7 08/10/2017   PLT 201 08/10/2017   Lab Results  Component Value Date   CREATININE 1.06 08/10/2017   BUN 18 08/10/2017   NA 138 08/10/2017   K 3.7 08/10/2017   CL 109 08/10/2017   CO2 23 08/10/2017    Anesthesia Physical Anesthesia Plan  ASA: IV  Anesthesia Plan: General   Post-op Pain Management:    Induction: Intravenous  PONV Risk Score and Plan: 2 and Ondansetron, Treatment may vary due to age or medical condition and Midazolam  Airway Management Planned: Oral ETT  Additional Equipment: Arterial line, CVP, PA Cath, TEE and Ultrasound Guidance Line Placement  Intra-op Plan:   Post-operative Plan: Post-operative intubation/ventilation  Informed Consent: I have reviewed the patients History and Physical, chart, labs and discussed the procedure including the risks, benefits and alternatives for the proposed anesthesia with the patient or authorized representative  who has indicated his/her understanding and acceptance.   Dental advisory given  Plan Discussed with: CRNA  Anesthesia Plan Comments:        Anesthesia Quick Evaluation

## 2017-08-12 ENCOUNTER — Encounter (HOSPITAL_COMMUNITY): Payer: Self-pay | Admitting: Certified Registered Nurse Anesthetist

## 2017-08-12 ENCOUNTER — Inpatient Hospital Stay (HOSPITAL_COMMUNITY): Payer: Medicare Other | Admitting: Certified Registered Nurse Anesthetist

## 2017-08-12 ENCOUNTER — Inpatient Hospital Stay (HOSPITAL_COMMUNITY): Payer: Medicare Other

## 2017-08-12 ENCOUNTER — Telehealth: Payer: Self-pay | Admitting: Gastroenterology

## 2017-08-12 ENCOUNTER — Inpatient Hospital Stay (HOSPITAL_COMMUNITY)
Admission: RE | Disposition: A | Discharge status: Home / Self Care | Payer: Self-pay | Source: Ambulatory Visit | Attending: Surgery

## 2017-08-12 DIAGNOSIS — Z951 Presence of aortocoronary bypass graft: Secondary | ICD-10-CM

## 2017-08-12 HISTORY — PX: TEE WITHOUT CARDIOVERSION: SHX5443

## 2017-08-12 HISTORY — PX: CORONARY ARTERY BYPASS GRAFT: SHX141

## 2017-08-12 LAB — CBC
HCT: 32.4 % — ABNORMAL LOW (ref 39.0–52.0)
HCT: 28.8 % — ABNORMAL LOW (ref 39.0–52.0)
HEMOGLOBIN: 9.8 g/dL — AB (ref 13.0–17.0)
Hemoglobin: 11.2 g/dL — ABNORMAL LOW (ref 13.0–17.0)
MCH: 32.7 pg (ref 26.0–34.0)
MCH: 30.7 pg (ref 26.0–34.0)
MCHC: 34.6 g/dL (ref 30.0–36.0)
MCHC: 34 g/dL (ref 30.0–36.0)
MCV: 94.7 fL (ref 78.0–100.0)
MCV: 90.3 fL (ref 78.0–100.0)
PLATELETS: 238 10*3/uL (ref 150–400)
Platelets: 171 10*3/uL (ref 150–400)
RBC: 3.42 MIL/uL — ABNORMAL LOW (ref 4.22–5.81)
RBC: 3.19 MIL/uL — AB (ref 4.22–5.81)
RDW: 13.1 % (ref 11.5–15.5)
RDW: 14.4 % (ref 11.5–15.5)
WBC: 6.2 10*3/uL (ref 4.0–10.5)
WBC: 11.7 10*3/uL — ABNORMAL HIGH (ref 4.0–10.5)

## 2017-08-12 LAB — POCT I-STAT, CHEM 8
BUN: 15 mg/dL (ref 6–20)
BUN: 14 mg/dL (ref 6–20)
BUN: 15 mg/dL (ref 6–20)
BUN: 14 mg/dL (ref 6–20)
BUN: 14 mg/dL (ref 6–20)
BUN: 14 mg/dL (ref 6–20)
BUN: 15 mg/dL (ref 6–20)
CALCIUM ION: 1.26 mmol/L (ref 1.15–1.40)
CALCIUM ION: 1.06 mmol/L — AB (ref 1.15–1.40)
CALCIUM ION: 1.19 mmol/L (ref 1.15–1.40)
CHLORIDE: 103 mmol/L (ref 101–111)
CHLORIDE: 100 mmol/L — AB (ref 101–111)
CHLORIDE: 99 mmol/L — AB (ref 101–111)
CHLORIDE: 101 mmol/L (ref 101–111)
CHLORIDE: 104 mmol/L (ref 101–111)
CREATININE: 0.9 mg/dL (ref 0.61–1.24)
CREATININE: 0.7 mg/dL (ref 0.61–1.24)
CREATININE: 0.9 mg/dL (ref 0.61–1.24)
CREATININE: 0.8 mg/dL (ref 0.61–1.24)
CREATININE: 0.9 mg/dL (ref 0.61–1.24)
Calcium, Ion: 1.1 mmol/L — ABNORMAL LOW (ref 1.15–1.40)
Calcium, Ion: 1.3 mmol/L (ref 1.15–1.40)
Calcium, Ion: 1.12 mmol/L — ABNORMAL LOW (ref 1.15–1.40)
Calcium, Ion: 1.06 mmol/L — ABNORMAL LOW (ref 1.15–1.40)
Chloride: 103 mmol/L (ref 101–111)
Chloride: 105 mmol/L (ref 101–111)
Creatinine, Ser: 0.9 mg/dL (ref 0.61–1.24)
Creatinine, Ser: 0.8 mg/dL (ref 0.61–1.24)
GLUCOSE: 109 mg/dL — AB (ref 65–99)
GLUCOSE: 112 mg/dL — AB (ref 65–99)
GLUCOSE: 135 mg/dL — AB (ref 65–99)
Glucose, Bld: 101 mg/dL — ABNORMAL HIGH (ref 65–99)
Glucose, Bld: 100 mg/dL — ABNORMAL HIGH (ref 65–99)
Glucose, Bld: 120 mg/dL — ABNORMAL HIGH (ref 65–99)
Glucose, Bld: 133 mg/dL — ABNORMAL HIGH (ref 65–99)
HCT: 26 % — ABNORMAL LOW (ref 39.0–52.0)
HCT: 26 % — ABNORMAL LOW (ref 39.0–52.0)
HCT: 23 % — ABNORMAL LOW (ref 39.0–52.0)
HEMATOCRIT: 24 % — AB (ref 39.0–52.0)
HEMATOCRIT: 21 % — AB (ref 39.0–52.0)
HEMATOCRIT: 22 % — AB (ref 39.0–52.0)
HEMATOCRIT: 30 % — AB (ref 39.0–52.0)
HEMOGLOBIN: 8.2 g/dL — AB (ref 13.0–17.0)
Hemoglobin: 8.8 g/dL — ABNORMAL LOW (ref 13.0–17.0)
Hemoglobin: 8.8 g/dL — ABNORMAL LOW (ref 13.0–17.0)
Hemoglobin: 7.1 g/dL — ABNORMAL LOW (ref 13.0–17.0)
Hemoglobin: 7.5 g/dL — ABNORMAL LOW (ref 13.0–17.0)
Hemoglobin: 7.8 g/dL — ABNORMAL LOW (ref 13.0–17.0)
Hemoglobin: 10.2 g/dL — ABNORMAL LOW (ref 13.0–17.0)
POTASSIUM: 4.1 mmol/L (ref 3.5–5.1)
POTASSIUM: 4.2 mmol/L (ref 3.5–5.1)
POTASSIUM: 4.6 mmol/L (ref 3.5–5.1)
Potassium: 4.2 mmol/L (ref 3.5–5.1)
Potassium: 4.1 mmol/L (ref 3.5–5.1)
Potassium: 5 mmol/L (ref 3.5–5.1)
Potassium: 4.1 mmol/L (ref 3.5–5.1)
Sodium: 137 mmol/L (ref 135–145)
Sodium: 138 mmol/L (ref 135–145)
Sodium: 139 mmol/L (ref 135–145)
Sodium: 135 mmol/L (ref 135–145)
Sodium: 134 mmol/L — ABNORMAL LOW (ref 135–145)
Sodium: 137 mmol/L (ref 135–145)
Sodium: 138 mmol/L (ref 135–145)
TCO2: 26 mmol/L (ref 22–32)
TCO2: 26 mmol/L (ref 22–32)
TCO2: 26 mmol/L (ref 22–32)
TCO2: 25 mmol/L (ref 22–32)
TCO2: 23 mmol/L (ref 22–32)
TCO2: 25 mmol/L (ref 22–32)
TCO2: 19 mmol/L — AB (ref 22–32)

## 2017-08-12 LAB — BASIC METABOLIC PANEL
ANION GAP: 9 (ref 5–15)
BUN: 15 mg/dL (ref 6–20)
CALCIUM: 9.3 mg/dL (ref 8.9–10.3)
CO2: 24 mmol/L (ref 22–32)
CREATININE: 1.15 mg/dL (ref 0.61–1.24)
Chloride: 104 mmol/L (ref 101–111)
Glucose, Bld: 104 mg/dL — ABNORMAL HIGH (ref 65–99)
Potassium: 3.9 mmol/L (ref 3.5–5.1)
SODIUM: 137 mmol/L (ref 135–145)

## 2017-08-12 LAB — GLUCOSE, CAPILLARY
GLUCOSE-CAPILLARY: 129 mg/dL — AB (ref 65–99)
GLUCOSE-CAPILLARY: 127 mg/dL — AB (ref 65–99)
Glucose-Capillary: 97 mg/dL (ref 65–99)
Glucose-Capillary: 141 mg/dL — ABNORMAL HIGH (ref 65–99)
Glucose-Capillary: 130 mg/dL — ABNORMAL HIGH (ref 65–99)
Glucose-Capillary: 133 mg/dL — ABNORMAL HIGH (ref 65–99)

## 2017-08-12 LAB — POCT I-STAT 3, ART BLOOD GAS (G3+)
ACID-BASE DEFICIT: 3 mmol/L — AB (ref 0.0–2.0)
ACID-BASE DEFICIT: 6 mmol/L — AB (ref 0.0–2.0)
Acid-base deficit: 1 mmol/L (ref 0.0–2.0)
Acid-base deficit: 5 mmol/L — ABNORMAL HIGH (ref 0.0–2.0)
BICARBONATE: 24.9 mmol/L (ref 20.0–28.0)
BICARBONATE: 22.7 mmol/L (ref 20.0–28.0)
Bicarbonate: 19 mmol/L — ABNORMAL LOW (ref 20.0–28.0)
Bicarbonate: 19.5 mmol/L — ABNORMAL LOW (ref 20.0–28.0)
O2 SAT: 92 %
O2 SAT: 97 %
O2 Saturation: 100 %
O2 Saturation: 95 %
PCO2 ART: 48.5 mmHg — AB (ref 32.0–48.0)
PCO2 ART: 31.8 mmHg — AB (ref 32.0–48.0)
PH ART: 7.318 — AB (ref 7.350–7.450)
PH ART: 7.333 — AB (ref 7.350–7.450)
PH ART: 7.382 (ref 7.350–7.450)
PO2 ART: 381 mmHg — AB (ref 83.0–108.0)
PO2 ART: 77 mmHg — AB (ref 83.0–108.0)
PO2 ART: 100 mmHg (ref 83.0–108.0)
Patient temperature: 35.7
Patient temperature: 36.5
Patient temperature: 36.9
TCO2: 26 mmol/L (ref 22–32)
TCO2: 24 mmol/L (ref 22–32)
TCO2: 20 mmol/L — ABNORMAL LOW (ref 22–32)
TCO2: 21 mmol/L — AB (ref 22–32)
pCO2 arterial: 42.2 mmHg (ref 32.0–48.0)
pCO2 arterial: 35.7 mmHg (ref 32.0–48.0)
pH, Arterial: 7.346 — ABNORMAL LOW (ref 7.350–7.450)
pO2, Arterial: 63 mmHg — ABNORMAL LOW (ref 83.0–108.0)

## 2017-08-12 LAB — HEMOGLOBIN A1C
HEMOGLOBIN A1C: 4.3 % — AB (ref 4.8–5.6)
MEAN PLASMA GLUCOSE: 76.71 mg/dL

## 2017-08-12 LAB — POCT I-STAT 4, (NA,K, GLUC, HGB,HCT)
GLUCOSE: 101 mg/dL — AB (ref 65–99)
HCT: 29 % — ABNORMAL LOW (ref 39.0–52.0)
Hemoglobin: 9.9 g/dL — ABNORMAL LOW (ref 13.0–17.0)
Potassium: 4.5 mmol/L (ref 3.5–5.1)
Sodium: 140 mmol/L (ref 135–145)

## 2017-08-12 LAB — HEMOGLOBIN AND HEMATOCRIT, BLOOD
HEMATOCRIT: 25.2 % — AB (ref 39.0–52.0)
HEMOGLOBIN: 8.5 g/dL — AB (ref 13.0–17.0)

## 2017-08-12 LAB — PREPARE RBC (CROSSMATCH)

## 2017-08-12 LAB — PROTIME-INR
INR: 1.39
Prothrombin Time: 17 seconds — ABNORMAL HIGH (ref 11.4–15.2)

## 2017-08-12 LAB — APTT: aPTT: 29 seconds (ref 24–36)

## 2017-08-12 LAB — PLATELET COUNT: PLATELETS: 159 10*3/uL (ref 150–400)

## 2017-08-12 SURGERY — CORONARY ARTERY BYPASS GRAFTING (CABG)
Anesthesia: General | Site: Chest

## 2017-08-12 MED ORDER — LACTATED RINGERS IV SOLN
INTRAVENOUS | Status: DC | PRN
  Administered 2017-08-12: 07:00:00 via INTRAVENOUS

## 2017-08-12 MED ORDER — ROCURONIUM BROMIDE 10 MG/ML (PF) SYRINGE
PREFILLED_SYRINGE | INTRAVENOUS | Status: AC
  Filled 2017-08-12: qty 15

## 2017-08-12 MED ORDER — LACTATED RINGERS IV SOLN
INTRAVENOUS | Status: DC
  Administered 2017-08-12: 14:00:00 via INTRAVENOUS

## 2017-08-12 MED ORDER — DOCUSATE SODIUM 100 MG PO CAPS
200.0000 mg | ORAL_CAPSULE | Freq: Every day | ORAL | Status: DC
  Administered 2017-08-13: 200 mg via ORAL
  Filled 2017-08-12: qty 2

## 2017-08-12 MED ORDER — BISACODYL 5 MG PO TBEC
10.0000 mg | DELAYED_RELEASE_TABLET | Freq: Every day | ORAL | Status: DC
  Administered 2017-08-13: 10 mg via ORAL
  Filled 2017-08-12: qty 2

## 2017-08-12 MED ORDER — DEXMEDETOMIDINE HCL IN NACL 200 MCG/50ML IV SOLN
0.0000 ug/kg/h | INTRAVENOUS | Status: DC
  Administered 2017-08-12: 0.7 ug/kg/h via INTRAVENOUS
  Filled 2017-08-12: qty 50

## 2017-08-12 MED ORDER — FENTANYL CITRATE (PF) 250 MCG/5ML IJ SOLN
INTRAMUSCULAR | Status: AC
  Filled 2017-08-12: qty 25

## 2017-08-12 MED ORDER — MORPHINE SULFATE (PF) 2 MG/ML IV SOLN
1.0000 mg | INTRAVENOUS | Status: AC | PRN
  Administered 2017-08-12: 1 mg via INTRAVENOUS
  Administered 2017-08-12: 2 mg via INTRAVENOUS
  Filled 2017-08-12: qty 1

## 2017-08-12 MED ORDER — METOPROLOL TARTRATE 5 MG/5ML IV SOLN
2.5000 mg | INTRAVENOUS | Status: DC | PRN
  Administered 2017-08-13: 5 mg via INTRAVENOUS
  Filled 2017-08-12: qty 5

## 2017-08-12 MED ORDER — ASPIRIN EC 325 MG PO TBEC
325.0000 mg | DELAYED_RELEASE_TABLET | Freq: Every day | ORAL | Status: DC
  Administered 2017-08-13: 325 mg via ORAL
  Filled 2017-08-12: qty 1

## 2017-08-12 MED ORDER — SODIUM CHLORIDE 0.9 % IV SOLN
INTRAVENOUS | Status: DC
  Administered 2017-08-12: 0.8 [IU]/h via INTRAVENOUS
  Filled 2017-08-12: qty 1

## 2017-08-12 MED ORDER — SUCCINYLCHOLINE CHLORIDE 20 MG/ML IJ SOLN
INTRAMUSCULAR | Status: DC | PRN
  Administered 2017-08-12: 120 mg via INTRAVENOUS

## 2017-08-12 MED ORDER — SODIUM CHLORIDE 0.9 % IV SOLN
INTRAVENOUS | Status: DC | PRN
  Administered 2017-08-12: 1 [IU]/h via INTRAVENOUS

## 2017-08-12 MED ORDER — SODIUM CHLORIDE 0.45 % IV SOLN
INTRAVENOUS | Status: DC | PRN
  Administered 2017-08-12: 14:00:00 via INTRAVENOUS

## 2017-08-12 MED ORDER — TRANEXAMIC ACID (OHS) BOLUS VIA INFUSION
15.0000 mg/kg | INTRAVENOUS | Status: AC
  Administered 2017-08-12: 1366.5 mg via INTRAVENOUS

## 2017-08-12 MED ORDER — LACTATED RINGERS IV SOLN
INTRAVENOUS | Status: DC
  Administered 2017-08-12 – 2017-08-13 (×2): via INTRAVENOUS

## 2017-08-12 MED ORDER — ACETAMINOPHEN 650 MG RE SUPP
650.0000 mg | Freq: Once | RECTAL | Status: AC
  Administered 2017-08-12: 650 mg via RECTAL

## 2017-08-12 MED ORDER — SODIUM CHLORIDE 0.9% FLUSH: 10.0000 mL | INTRAVENOUS | Status: DC | PRN

## 2017-08-12 MED ORDER — THROMBIN 20000 UNITS EX SOLR
CUTANEOUS | Status: AC
  Filled 2017-08-12: qty 20000

## 2017-08-12 MED ORDER — MIDAZOLAM HCL 2 MG/2ML IJ SOLN: 2.0000 mg | INTRAMUSCULAR | Status: DC | PRN

## 2017-08-12 MED ORDER — ALBUMIN HUMAN 5 % IV SOLN
250.0000 mL | INTRAVENOUS | Status: AC | PRN
  Administered 2017-08-12: 250 mL via INTRAVENOUS

## 2017-08-12 MED ORDER — SODIUM CHLORIDE 0.9% FLUSH
3.0000 mL | Freq: Two times a day (BID) | INTRAVENOUS | Status: DC
  Administered 2017-08-13: 9 mL via INTRAVENOUS
  Administered 2017-08-13: 3 mL via INTRAVENOUS

## 2017-08-12 MED ORDER — CEFAZOLIN SODIUM-DEXTROSE 2-4 GM/100ML-% IV SOLN
2.0000 g | Freq: Three times a day (TID) | INTRAVENOUS | Status: DC
  Administered 2017-08-12 – 2017-08-13 (×5): 2 g via INTRAVENOUS
  Filled 2017-08-12 (×6): qty 100

## 2017-08-12 MED ORDER — TRANEXAMIC ACID 1000 MG/10ML IV SOLN
INTRAVENOUS | Status: DC | PRN
  Administered 2017-08-12: 1.5 mg/kg/h via INTRAVENOUS

## 2017-08-12 MED ORDER — CHLORHEXIDINE GLUCONATE 0.12 % MT SOLN
15.0000 mL | Freq: Two times a day (BID) | OROMUCOSAL | Status: DC
  Administered 2017-08-12 – 2017-08-13 (×4): 15 mL via OROMUCOSAL
  Filled 2017-08-12 (×3): qty 15

## 2017-08-12 MED ORDER — PANTOPRAZOLE SODIUM 40 MG PO TBEC: 40.0000 mg | DELAYED_RELEASE_TABLET | Freq: Every day | ORAL | Status: DC

## 2017-08-12 MED ORDER — CHLORHEXIDINE GLUCONATE CLOTH 2 % EX PADS
6.0000 | MEDICATED_PAD | Freq: Every day | CUTANEOUS | Status: DC
  Administered 2017-08-12 – 2017-08-13 (×2): 6 via TOPICAL

## 2017-08-12 MED ORDER — FAMOTIDINE IN NACL 20-0.9 MG/50ML-% IV SOLN
20.0000 mg | Freq: Two times a day (BID) | INTRAVENOUS | Status: AC
  Administered 2017-08-12 – 2017-08-13 (×2): 20 mg via INTRAVENOUS
  Filled 2017-08-12 (×2): qty 50

## 2017-08-12 MED ORDER — FENTANYL CITRATE (PF) 250 MCG/5ML IJ SOLN
INTRAMUSCULAR | Status: DC | PRN
  Administered 2017-08-12: 50 ug via INTRAVENOUS
  Administered 2017-08-12: 200 ug via INTRAVENOUS
  Administered 2017-08-12: 100 ug via INTRAVENOUS
  Administered 2017-08-12: 200 ug via INTRAVENOUS
  Administered 2017-08-12: 450 ug via INTRAVENOUS

## 2017-08-12 MED ORDER — PROPOFOL 10 MG/ML IV BOLUS
INTRAVENOUS | Status: AC
  Filled 2017-08-12: qty 20

## 2017-08-12 MED ORDER — MIDAZOLAM HCL 10 MG/2ML IJ SOLN
INTRAMUSCULAR | Status: AC
  Filled 2017-08-12: qty 2

## 2017-08-12 MED ORDER — ACETAMINOPHEN 160 MG/5ML PO SOLN: 1000.0000 mg | Freq: Four times a day (QID) | ORAL | Status: DC

## 2017-08-12 MED ORDER — PLASMA-LYTE 148 IV SOLN
INTRAVENOUS | Status: DC | PRN
  Administered 2017-08-12: 08:00:00 via INTRAVASCULAR

## 2017-08-12 MED ORDER — ROCURONIUM BROMIDE 100 MG/10ML IV SOLN
INTRAVENOUS | Status: DC | PRN
  Administered 2017-08-12: 100 mg via INTRAVENOUS
  Administered 2017-08-12: 50 mg via INTRAVENOUS
  Administered 2017-08-12: 100 mg via INTRAVENOUS

## 2017-08-12 MED ORDER — METOPROLOL TARTRATE 12.5 MG HALF TABLET
12.5000 mg | ORAL_TABLET | Freq: Two times a day (BID) | ORAL | Status: DC
  Administered 2017-08-13 (×2): 12.5 mg via ORAL
  Filled 2017-08-12 (×3): qty 1

## 2017-08-12 MED ORDER — VANCOMYCIN HCL IN DEXTROSE 1-5 GM/200ML-% IV SOLN
1000.0000 mg | Freq: Once | INTRAVENOUS | Status: AC
  Administered 2017-08-12: 1000 mg via INTRAVENOUS
  Filled 2017-08-12 (×2): qty 200

## 2017-08-12 MED ORDER — LIDOCAINE HCL (CARDIAC) 20 MG/ML IV SOLN
INTRAVENOUS | Status: DC | PRN
  Administered 2017-08-12: 100 mg via INTRAVENOUS

## 2017-08-12 MED ORDER — EPHEDRINE 5 MG/ML INJ
INTRAVENOUS | Status: AC
  Filled 2017-08-12: qty 10

## 2017-08-12 MED ORDER — PHENYLEPHRINE HCL 10 MG/ML IJ SOLN
INTRAMUSCULAR | Status: DC | PRN
  Administered 2017-08-12: 40 ug via INTRAVENOUS

## 2017-08-12 MED ORDER — ONDANSETRON HCL 4 MG/2ML IJ SOLN
4.0000 mg | Freq: Four times a day (QID) | INTRAMUSCULAR | Status: DC | PRN
  Administered 2017-08-13: 4 mg via INTRAVENOUS
  Filled 2017-08-12: qty 2

## 2017-08-12 MED ORDER — SODIUM CHLORIDE 0.9 % IV SOLN
INTRAVENOUS | Status: DC
  Administered 2017-08-12: 14:00:00 via INTRAVENOUS

## 2017-08-12 MED ORDER — ORAL CARE MOUTH RINSE: 15.0000 mL | Freq: Two times a day (BID) | OROMUCOSAL | Status: DC

## 2017-08-12 MED ORDER — HEMOSTATIC AGENTS (NO CHARGE) OPTIME
TOPICAL | Status: DC | PRN
  Administered 2017-08-12: 1 via TOPICAL

## 2017-08-12 MED ORDER — OXYCODONE HCL 5 MG PO TABS
5.0000 mg | ORAL_TABLET | ORAL | Status: DC | PRN
  Administered 2017-08-12 – 2017-08-14 (×8): 10 mg via ORAL
  Filled 2017-08-12 (×9): qty 2

## 2017-08-12 MED ORDER — ASPIRIN 81 MG PO CHEW: 324.0000 mg | CHEWABLE_TABLET | Freq: Every day | ORAL | Status: DC

## 2017-08-12 MED ORDER — LACTATED RINGERS IV SOLN: 500.0000 mL | Freq: Once | INTRAVENOUS | Status: DC | PRN

## 2017-08-12 MED ORDER — SODIUM CHLORIDE 0.9 % IV SOLN: 250.0000 mL | INTRAVENOUS | Status: DC

## 2017-08-12 MED ORDER — ALBUMIN HUMAN 5 % IV SOLN
INTRAVENOUS | Status: DC | PRN
  Administered 2017-08-12 (×2): via INTRAVENOUS

## 2017-08-12 MED ORDER — CHLORHEXIDINE GLUCONATE 0.12 % MT SOLN
15.0000 mL | OROMUCOSAL | Status: AC
  Administered 2017-08-12: 15 mL via OROMUCOSAL
  Filled 2017-08-12: qty 15

## 2017-08-12 MED ORDER — INSULIN REGULAR BOLUS VIA INFUSION
0.0000 [IU] | Freq: Three times a day (TID) | INTRAVENOUS | Status: DC
  Filled 2017-08-12: qty 10

## 2017-08-12 MED ORDER — TRAMADOL HCL 50 MG PO TABS
50.0000 mg | ORAL_TABLET | ORAL | Status: DC | PRN
  Administered 2017-08-14: 100 mg via ORAL
  Filled 2017-08-12: qty 2

## 2017-08-12 MED ORDER — ACETAMINOPHEN 500 MG PO TABS
1000.0000 mg | ORAL_TABLET | Freq: Four times a day (QID) | ORAL | Status: DC
  Administered 2017-08-12 – 2017-08-14 (×6): 1000 mg via ORAL
  Filled 2017-08-12 (×6): qty 2

## 2017-08-12 MED ORDER — BISACODYL 10 MG RE SUPP: 10.0000 mg | Freq: Every day | RECTAL | Status: DC

## 2017-08-12 MED ORDER — ACETAMINOPHEN 160 MG/5ML PO SOLN: 650.0000 mg | Freq: Once | ORAL | Status: AC

## 2017-08-12 MED ORDER — SODIUM CHLORIDE 0.9 % IV SOLN
0.0000 ug/min | INTRAVENOUS | Status: DC
  Filled 2017-08-12: qty 2

## 2017-08-12 MED ORDER — LACTATED RINGERS IV SOLN
INTRAVENOUS | Status: DC | PRN
  Administered 2017-08-12 (×2): via INTRAVENOUS

## 2017-08-12 MED ORDER — MIDAZOLAM HCL 5 MG/5ML IJ SOLN
INTRAMUSCULAR | Status: DC | PRN
  Administered 2017-08-12: 1 mg via INTRAVENOUS
  Administered 2017-08-12: 3 mg via INTRAVENOUS
  Administered 2017-08-12: 5 mg via INTRAVENOUS
  Administered 2017-08-12: 1 mg via INTRAVENOUS

## 2017-08-12 MED ORDER — NITROGLYCERIN IN D5W 200-5 MCG/ML-% IV SOLN: 0.0000 ug/min | INTRAVENOUS | Status: DC

## 2017-08-12 MED ORDER — LIDOCAINE 2% (20 MG/ML) 5 ML SYRINGE
INTRAMUSCULAR | Status: AC
  Filled 2017-08-12: qty 5

## 2017-08-12 MED ORDER — THROMBIN (RECOMBINANT) 20000 UNITS EX SOLR
CUTANEOUS | Status: DC | PRN
  Administered 2017-08-12: 20000 [IU] via TOPICAL

## 2017-08-12 MED ORDER — HEPARIN SODIUM (PORCINE) 1000 UNIT/ML IJ SOLN
INTRAMUSCULAR | Status: DC | PRN
  Administered 2017-08-12: 29 mL via INTRAVENOUS

## 2017-08-12 MED ORDER — POTASSIUM CHLORIDE 10 MEQ/50ML IV SOLN: 10.0000 meq | INTRAVENOUS | Status: AC

## 2017-08-12 MED ORDER — MORPHINE SULFATE (PF) 2 MG/ML IV SOLN
2.0000 mg | INTRAVENOUS | Status: DC | PRN
  Administered 2017-08-13 (×2): 4 mg via INTRAVENOUS
  Filled 2017-08-12: qty 2
  Filled 2017-08-12: qty 1
  Filled 2017-08-12: qty 2

## 2017-08-12 MED ORDER — ATORVASTATIN CALCIUM 80 MG PO TABS
80.0000 mg | ORAL_TABLET | Freq: Every day | ORAL | Status: DC
  Administered 2017-08-13 – 2017-08-17 (×5): 80 mg via ORAL
  Filled 2017-08-12 (×5): qty 1

## 2017-08-12 MED ORDER — SODIUM CHLORIDE 0.9% FLUSH
10.0000 mL | Freq: Two times a day (BID) | INTRAVENOUS | Status: DC
  Administered 2017-08-12: 10 mL
  Administered 2017-08-12: 40 mL
  Administered 2017-08-13 (×2): 10 mL

## 2017-08-12 MED ORDER — SODIUM CHLORIDE 0.9 % IV SOLN
INTRAVENOUS | Status: DC | PRN
  Administered 2017-08-12: 0.2 ug/kg/h via INTRAVENOUS

## 2017-08-12 MED ORDER — 0.9 % SODIUM CHLORIDE (POUR BTL) OPTIME
TOPICAL | Status: DC | PRN
  Administered 2017-08-12: 6000 mL

## 2017-08-12 MED ORDER — MAGNESIUM SULFATE 4 GM/100ML IV SOLN
4.0000 g | Freq: Once | INTRAVENOUS | Status: AC
  Administered 2017-08-12: 4 g via INTRAVENOUS
  Filled 2017-08-12: qty 100

## 2017-08-12 MED ORDER — THROMBIN (RECOMBINANT) 20000 UNITS EX SOLR
CUTANEOUS | Status: DC | PRN
  Administered 2017-08-12 (×3): 4 mL via TOPICAL

## 2017-08-12 MED ORDER — METOPROLOL TARTRATE 25 MG/10 ML ORAL SUSPENSION: 12.5000 mg | Freq: Two times a day (BID) | ORAL | Status: DC

## 2017-08-12 MED ORDER — SODIUM CHLORIDE 0.9% FLUSH: 3.0000 mL | INTRAVENOUS | Status: DC | PRN

## 2017-08-12 MED ORDER — PROTAMINE SULFATE 10 MG/ML IV SOLN
INTRAVENOUS | Status: DC | PRN
  Administered 2017-08-12: 90 mg via INTRAVENOUS
  Administered 2017-08-12 (×2): 100 mg via INTRAVENOUS

## 2017-08-12 MED ORDER — PROPOFOL 10 MG/ML IV BOLUS
INTRAVENOUS | Status: DC | PRN
  Administered 2017-08-12: 30 mg via INTRAVENOUS
  Administered 2017-08-12: 90 mg via INTRAVENOUS
  Administered 2017-08-12: 20 mg via INTRAVENOUS

## 2017-08-12 MED ORDER — EPHEDRINE SULFATE 50 MG/ML IJ SOLN
INTRAMUSCULAR | Status: DC | PRN
  Administered 2017-08-12 (×2): 5 mg via INTRAVENOUS

## 2017-08-12 SURGICAL SUPPLY — 106 items
BAG DECANTER FOR FLEXI CONT (MISCELLANEOUS) ×4 IMPLANT
BANDAGE ACE 4X5 VEL STRL LF (GAUZE/BANDAGES/DRESSINGS) ×4 IMPLANT
BANDAGE ACE 6X5 VEL STRL LF (GAUZE/BANDAGES/DRESSINGS) ×4 IMPLANT
BASKET HEART  (ORDER IN 25'S) (MISCELLANEOUS) ×1
BASKET HEART (ORDER IN 25'S) (MISCELLANEOUS) ×1
BASKET HEART (ORDER IN 25S) (MISCELLANEOUS) ×2 IMPLANT
BLADE STERNUM SYSTEM 6 (BLADE) ×4 IMPLANT
BLADE SURG 11 STRL SS (BLADE) ×4 IMPLANT
BNDG GAUZE ELAST 4 BULKY (GAUZE/BANDAGES/DRESSINGS) ×4 IMPLANT
CANISTER SUCT 3000ML PPV (MISCELLANEOUS) ×4 IMPLANT
CATH ROBINSON RED A/P 18FR (CATHETERS) ×8 IMPLANT
CATH THORACIC 28FR (CATHETERS) ×4 IMPLANT
CATH THORACIC 36FR (CATHETERS) ×4 IMPLANT
CATH THORACIC 36FR RT ANG (CATHETERS) ×4 IMPLANT
CLIP VESOCCLUDE MED 24/CT (CLIP) IMPLANT
CLIP VESOCCLUDE SM WIDE 24/CT (CLIP) ×4 IMPLANT
CRADLE DONUT ADULT HEAD (MISCELLANEOUS) ×4 IMPLANT
DERMABOND ADHESIVE PROPEN (GAUZE/BANDAGES/DRESSINGS) ×2
DERMABOND ADVANCED .7 DNX6 (GAUZE/BANDAGES/DRESSINGS) ×2 IMPLANT
DRAPE CARDIOVASCULAR INCISE (DRAPES) ×2
DRAPE SLUSH/WARMER DISC (DRAPES) ×4 IMPLANT
DRAPE SRG 135X102X78XABS (DRAPES) ×2 IMPLANT
DRSG COVADERM 4X14 (GAUZE/BANDAGES/DRESSINGS) ×4 IMPLANT
ELECT CAUTERY BLADE 6.4 (BLADE) ×4 IMPLANT
ELECT REM PT RETURN 9FT ADLT (ELECTROSURGICAL) ×8
ELECTRODE REM PT RTRN 9FT ADLT (ELECTROSURGICAL) ×4 IMPLANT
FELT TEFLON 1X6 (MISCELLANEOUS) ×8 IMPLANT
GAUZE SPONGE 4X4 12PLY STRL (GAUZE/BANDAGES/DRESSINGS) ×8 IMPLANT
GAUZE SPONGE 4X4 12PLY STRL LF (GAUZE/BANDAGES/DRESSINGS) ×8 IMPLANT
GLOVE BIO SURGEON STRL SZ 6 (GLOVE) IMPLANT
GLOVE BIO SURGEON STRL SZ 6.5 (GLOVE) ×18 IMPLANT
GLOVE BIO SURGEON STRL SZ7 (GLOVE) ×12 IMPLANT
GLOVE BIO SURGEON STRL SZ7.5 (GLOVE) IMPLANT
GLOVE BIO SURGEONS STRL SZ 6.5 (GLOVE) ×6
GLOVE BIOGEL PI IND STRL 6 (GLOVE) IMPLANT
GLOVE BIOGEL PI IND STRL 6.5 (GLOVE) IMPLANT
GLOVE BIOGEL PI IND STRL 7.0 (GLOVE) ×6 IMPLANT
GLOVE BIOGEL PI INDICATOR 6 (GLOVE)
GLOVE BIOGEL PI INDICATOR 6.5 (GLOVE)
GLOVE BIOGEL PI INDICATOR 7.0 (GLOVE) ×6
GLOVE EUDERMIC 7 POWDERFREE (GLOVE) ×8 IMPLANT
GLOVE ORTHO TXT STRL SZ7.5 (GLOVE) IMPLANT
GLOVE SURG SS PI 6.0 STRL IVOR (GLOVE) ×8 IMPLANT
GOWN STRL REUS W/ TWL LRG LVL3 (GOWN DISPOSABLE) ×16 IMPLANT
GOWN STRL REUS W/ TWL XL LVL3 (GOWN DISPOSABLE) ×4 IMPLANT
GOWN STRL REUS W/TWL LRG LVL3 (GOWN DISPOSABLE) ×16
GOWN STRL REUS W/TWL XL LVL3 (GOWN DISPOSABLE) ×4
HEMOSTAT POWDER SURGIFOAM 1G (HEMOSTASIS) ×12 IMPLANT
HEMOSTAT SURGICEL 2X14 (HEMOSTASIS) ×4 IMPLANT
INSERT FOGARTY 61MM (MISCELLANEOUS) IMPLANT
INSERT FOGARTY XLG (MISCELLANEOUS) IMPLANT
KIT BASIN OR (CUSTOM PROCEDURE TRAY) ×4 IMPLANT
KIT CATH CPB BARTLE (MISCELLANEOUS) ×4 IMPLANT
KIT SUCTION CATH 14FR (SUCTIONS) ×4 IMPLANT
KIT TURNOVER KIT B (KITS) ×4 IMPLANT
KIT VASOVIEW HEMOPRO VH 3000 (KITS) ×4 IMPLANT
LINE VENT (MISCELLANEOUS) ×4 IMPLANT
NS IRRIG 1000ML POUR BTL (IV SOLUTION) ×24 IMPLANT
PACK E OPEN HEART (SUTURE) ×4 IMPLANT
PACK OPEN HEART (CUSTOM PROCEDURE TRAY) ×4 IMPLANT
PAD ARMBOARD 7.5X6 YLW CONV (MISCELLANEOUS) ×8 IMPLANT
PAD ELECT DEFIB RADIOL ZOLL (MISCELLANEOUS) ×4 IMPLANT
PENCIL BUTTON HOLSTER BLD 10FT (ELECTRODE) ×4 IMPLANT
PUNCH AORTIC ROTATE 4.0MM (MISCELLANEOUS) IMPLANT
PUNCH AORTIC ROTATE 4.5MM 8IN (MISCELLANEOUS) ×4 IMPLANT
PUNCH AORTIC ROTATE 5MM 8IN (MISCELLANEOUS) IMPLANT
SENSOR MYOCARDIAL TEMP (MISCELLANEOUS) ×4 IMPLANT
SET CARDIOPLEGIA MPS 5001102 (MISCELLANEOUS) ×4 IMPLANT
SOLUTION ANTI FOG 6CC (MISCELLANEOUS) ×4 IMPLANT
SPONGE INTESTINAL PEANUT (DISPOSABLE) IMPLANT
SPONGE LAP 18X18 X RAY DECT (DISPOSABLE) ×4 IMPLANT
SPONGE LAP 4X18 X RAY DECT (DISPOSABLE) ×4 IMPLANT
SUT BONE WAX W31G (SUTURE) ×4 IMPLANT
SUT MNCRL AB 4-0 PS2 18 (SUTURE) ×4 IMPLANT
SUT PROLENE 3 0 SH DA (SUTURE) IMPLANT
SUT PROLENE 3 0 SH1 36 (SUTURE) ×4 IMPLANT
SUT PROLENE 4 0 RB 1 (SUTURE)
SUT PROLENE 4 0 SH DA (SUTURE) IMPLANT
SUT PROLENE 4-0 RB1 .5 CRCL 36 (SUTURE) IMPLANT
SUT PROLENE 5 0 C 1 36 (SUTURE) IMPLANT
SUT PROLENE 6 0 C 1 30 (SUTURE) ×8 IMPLANT
SUT PROLENE 7 0 BV 1 (SUTURE) IMPLANT
SUT PROLENE 7 0 BV1 MDA (SUTURE) ×8 IMPLANT
SUT PROLENE 8 0 BV175 6 (SUTURE) IMPLANT
SUT SILK  1 MH (SUTURE)
SUT SILK 1 MH (SUTURE) IMPLANT
SUT STEEL STERNAL CCS#1 18IN (SUTURE) IMPLANT
SUT STEEL SZ 6 DBL 3X14 BALL (SUTURE) IMPLANT
SUT VIC AB 1 CTX 36 (SUTURE) ×6
SUT VIC AB 1 CTX36XBRD ANBCTR (SUTURE) ×6 IMPLANT
SUT VIC AB 2-0 CT1 27 (SUTURE) ×2
SUT VIC AB 2-0 CT1 TAPERPNT 27 (SUTURE) ×2 IMPLANT
SUT VIC AB 2-0 CTX 27 (SUTURE) ×8 IMPLANT
SUT VIC AB 3-0 SH 27 (SUTURE)
SUT VIC AB 3-0 SH 27X BRD (SUTURE) IMPLANT
SUT VIC AB 3-0 X1 27 (SUTURE) IMPLANT
SUT VICRYL 4-0 PS2 18IN ABS (SUTURE) IMPLANT
SYSTEM SAHARA CHEST DRAIN ATS (WOUND CARE) ×4 IMPLANT
TAPE CLOTH SURG 4X10 WHT LF (GAUZE/BANDAGES/DRESSINGS) ×4 IMPLANT
TAPE PAPER 2X10 WHT MICROPORE (GAUZE/BANDAGES/DRESSINGS) ×4 IMPLANT
TOWEL GREEN STERILE (TOWEL DISPOSABLE) ×4 IMPLANT
TOWEL GREEN STERILE FF (TOWEL DISPOSABLE) ×4 IMPLANT
TRAY FOLEY SILVER 16FR TEMP (SET/KITS/TRAYS/PACK) ×4 IMPLANT
TUBING INSUFFLATION (TUBING) ×4 IMPLANT
UNDERPAD 30X30 (UNDERPADS AND DIAPERS) ×4 IMPLANT
WATER STERILE IRR 1000ML POUR (IV SOLUTION) ×8 IMPLANT

## 2017-08-12 NOTE — Anesthesia Procedure Notes (Signed)
Procedure Name: Intubation Date/Time: 08/12/2017 7:50 AM Performed by: Nickie Warwick T, CRNA Pre-anesthesia Checklist: Patient identified, Emergency Drugs available, Suction available, Patient being monitored and Timeout performed Patient Re-evaluated:Patient Re-evaluated prior to induction Oxygen Delivery Method: Circle system utilized Preoxygenation: Pre-oxygenation with 100% oxygen Induction Type: IV induction Ventilation: Oral airway inserted - appropriate to patient size Laryngoscope Size: Mac and 3 Grade View: Grade II Tube type: Subglottic suction tube Tube size: 8.0 mm Number of attempts: 1 Airway Equipment and Method: Patient positioned with wedge pillow,  Stylet and Oral airway Placement Confirmation: ETT inserted through vocal cords under direct vision and positive ETCO2

## 2017-08-12 NOTE — Brief Op Note (Signed)
08/09/2017 - 08/12/2017  1:47 PM  PATIENT:  Stuart Gentry  72 y.o. male  PRE-OPERATIVE DIAGNOSIS:  CAD  POST-OPERATIVE DIAGNOSIS:  CAD  PROCEDURE:  Procedure(s): CORONARY ARTERY BYPASS GRAFTING (CABG) x six , using left internal mammory artery and right leg greater saphenous vein harvested endoscopically. (N/A) TRANSESOPHAGEAL ECHOCARDIOGRAM (TEE) (N/A)  LIMA to LAD SVG to PDA SVG to OM1 and OM3 SVG to Diag1 and Diag 2   SURGEON:  Surgeon(s) and Role:    * Bartle, Fernande Boyden, MD - Primary  PHYSICIAN ASSISTANT:  Nicholes Rough, PA-C   ANESTHESIA:  GENERAL  EBL: 55ml  BLOOD ADMINISTERED:none  DRAINS: ROUTINE   LOCAL MEDICATIONS USED:  NONE  SPECIMEN:  No Specimen  DISPOSITION OF SPECIMEN:  N/A  COUNTS:  YES  DICTATION: .Dragon Dictation  PLAN OF CARE: Admit to inpatient   PATIENT DISPOSITION:  ICU - intubated and hemodynamically stable.   Delay start of Pharmacological VTE agent (>24hrs) due to surgical blood loss or risk of bleeding: yes

## 2017-08-12 NOTE — Telephone Encounter (Signed)
The pt's wife states that her husband is having heart bypass today and he was worried the surgery would affect his UC.  I did advise her that the nursing staff would let the attending MD know and GI could be consulted.  She states she felt better and would call with more questions.

## 2017-08-12 NOTE — Plan of Care (Signed)
  Problem: Education: Goal: Knowledge of General Education information will improve Outcome: Progressing   Problem: Elimination: Goal: Will not experience complications related to bowel motility Outcome: Not Progressing Note:  No BM gave PRN. Prune juice and patient walked up and down hall many times with no results. No urge to defecate.

## 2017-08-12 NOTE — Op Note (Signed)
CARDIOVASCULAR SURGERY OPERATIVE NOTE  08/12/2017  Surgeon:  Gaye Pollack, MD  First Assistant: Nicholes Rough,  PA-C   Preoperative Diagnosis:  Severe multi-vessel coronary artery disease   Postoperative Diagnosis:  Same   Procedure:  1. Median Sternotomy 2. Extracorporeal circulation 3.   Coronary artery bypass grafting x 6   Left internal mammary graft to the LAD  Sequential SVG to Diagonal 1 and Diagonal 2  Sequential SVG to OM1 and OM3  SVG to PDA 4.   Endoscopic vein harvest from the right leg   Anesthesia:  General Endotracheal   Clinical History/Surgical Indication:   He has severe 3-vessel coronary artery disease with preserved LV function. His LM and proximal vessels are heavily calcified. Echo today shows an EF of 60% with no significant valvular disease. I agree that the best treatment is CABG. I discussed the operative procedure with the patient and his wife and son including alternatives, benefits and risks; including but not limited to bleeding, blood transfusion, infection, stroke, myocardial infarction, graft failure, heart block requiring a permanent pacemaker, organ dysfunction, and death.  Tori Milks understands and agrees to proceed.    Preparation:  The patient was seen in the preoperative holding area and the correct patient, correct operation were confirmed with the patient after reviewing the medical record and catheterization. The consent was signed by me. Preoperative antibiotics were given. A pulmonary arterial line and radial arterial line were placed by the anesthesia team. The patient was taken back to the operating room and positioned supine on the operating room table. After being placed under general endotracheal anesthesia by the anesthesia team a foley catheter was placed. The neck, chest, abdomen, and both legs were prepped with betadine  soap and solution and draped in the usual sterile manner. A surgical time-out was taken and the correct patient and operative procedure were confirmed with the nursing and anesthesia staff.   Cardiopulmonary Bypass:  A median sternotomy was performed. The pericardium was opened in the midline. Right ventricular function appeared normal. The ascending aorta was of normal size and had no palpable plaque. There were no contraindications to aortic cannulation or cross-clamping. The patient was fully systemically heparinized and the ACT was maintained > 400 sec. The proximal aortic arch was cannulated with a 20 F aortic cannula for arterial inflow. Venous cannulation was performed via the right atrial appendage using a two-staged venous cannula. An antegrade cardioplegia/vent cannula was inserted into the mid-ascending aorta. Aortic occlusion was performed with a single cross-clamp. Systemic cooling to 32 degrees Centigrade and topical cooling of the heart with iced saline were used. Hyperkalemic antegrade cold blood cardioplegia was used to induce diastolic arrest and was then given at about 20 minute intervals throughout the period of arrest to maintain myocardial temperature at or below 10 degrees centigrade. A temperature probe was inserted into the interventricular septum and an insulating pad was placed in the pericardium.   Left internal mammary harvest:  The left side of the sternum was retracted using the Rultract retractor. The left internal mammary artery was harvested as a pedicle graft. All side branches were clipped. It was a medium-sized vessel of good quality with excellent blood flow. It was ligated distally and divided. It was sprayed with topical papaverine solution to prevent vasospasm.   Endoscopic vein harvest:  The right greater saphenous vein was harvested endoscopically through a 2 cm incision medial to the right knee. It was harvested from the upper thigh to below the knee.  It was  a medium-sized vein of good quality. The side branches were all ligated with 4-0 silk ties.    Coronary arteries:  The coronary arteries were examined.   LAD:  Small vessel with no significant distal disease. The two diagonal branches were moderate sized vessels with segmental distal disease.  LCX:  OM1 and OM3 were moderate sized vessels with no distal disease. OM2 was small.  RCA:  Diffusely diseased. The PDA was a moderate sized vessel with no distal disease. Small PL branch.   Grafts:  1. LIMA to the LAD: 1.25 mm. It was sewn end to side using 8-0 prolene continuous suture. 2. Sequential SVG to diagonal 1:  1.6 mm. It was sewn sequential side to side using 7-0 prolene continuous suture. 3. Sequential SVG to diagonal 2:  1.75 mm. It was sewn sequential end to side using 7-0 prolene continuous suture. 4. Sequential SVG to OM1:  1.75 mm. It was sewn sequential side to side using 7-0 prolene continuous suture. 5. Sequential SVG to OM3:   1.75 mm. It was sewn sequential end to side using 7-0 prolene continuous suture. 6. SVG to PDA:   1.75 mm. It was sewn in end to side manner using 7-0 prolene continuous suture.   The proximal vein graft anastomoses were performed to the mid-ascending aorta using continuous 6-0 prolene suture. Graft markers were placed around the proximal anastomoses.   Completion:  The patient was rewarmed to 37 degrees Centigrade. The clamp was removed from the LIMA pedicle and there was rapid warming of the septum and return of ventricular fibrillation. The crossclamp was removed with a time of 108 minutes. There was spontaneous return of sinus rhythm. The distal and proximal anastomoses were checked for hemostasis. The position of the grafts was satisfactory. Two temporary epicardial pacing wires were placed on the right atrium and two on the right ventricle. The patient was weaned from CPB without difficulty on no inotropes. CPB time was 126 minutes. Cardiac output  was 5 LPM. TEE showed normal LV function. Heparin was fully reversed with protamine and the aortic and venous cannulas removed. Hemostasis was achieved. Mediastinal and left pleural drainage tubes were placed. The sternum was closed with double #6 stainless steel wires. The fascia was closed with continuous # 1 vicryl suture. The subcutaneous tissue was closed with 2-0 vicryl continuous suture. The skin was closed with 3-0 vicryl subcuticular suture. All sponge, needle, and instrument counts were reported correct at the end of the case. Dry sterile dressings were placed over the incisions and around the chest tubes which were connected to pleurevac suction. The patient was then transported to the surgical intensive care unit in stable condition.

## 2017-08-12 NOTE — Progress Notes (Signed)
Patient transported to OR Report given at bedside. Tele off and returned to [place] notified.

## 2017-08-12 NOTE — Anesthesia Procedure Notes (Signed)
Central Venous Catheter Insertion Performed by: Nolon Nations, MD, anesthesiologist Start/End4/15/2019 6:50 AM, 08/12/2017 6:55 AM Patient location: Pre-op. Preanesthetic checklist: patient identified, IV checked, site marked, risks and benefits discussed, surgical consent, monitors and equipment checked, pre-op evaluation, timeout performed and anesthesia consent Hand hygiene performed  and maximum sterile barriers used  PA cath was placed.Swan type:thermodilution PA Cath depth:50 Procedure performed without using ultrasound guided technique. Attempts: 1 Patient tolerated the procedure well with no immediate complications.

## 2017-08-12 NOTE — Progress Notes (Signed)
  Echocardiogram Echocardiogram Transesophageal has been performed.  Dorathy Stallone G Enzio Buchler 08/12/2017, 10:01 AM

## 2017-08-12 NOTE — Telephone Encounter (Signed)
Pt's wife Arbie Cookey would like to speak regarding pt's UC as he is having surgery today. Pls call her.

## 2017-08-12 NOTE — Anesthesia Procedure Notes (Signed)
Arterial Line Insertion Start/End4/15/2019 6:55 AM, 08/12/2017 7:02 AM Performed by: Alexandria Current T, Immunologist, CRNA  Patient location: Pre-op. Preanesthetic checklist: patient identified, IV checked, site marked, risks and benefits discussed, surgical consent, monitors and equipment checked and pre-op evaluation Lidocaine 1% used for infiltration and patient sedated Left, radial was placed Catheter size: 20 G Hand hygiene performed  and maximum sterile barriers used   Attempts: 1 Procedure performed without using ultrasound guided technique. Following insertion, dressing applied and Biopatch. Post procedure assessment: normal  Patient tolerated the procedure well with no immediate complications. Additional procedure comments: By Hadassah Pais.

## 2017-08-12 NOTE — Progress Notes (Signed)
      [address]       [place] [address]             [phone]      S/p CABG x 6  Intubated but waking up  BP 126/60   Pulse 83   Temp (!) 97.3 F (36.3 C)   Resp 19   Ht 5' 11"  (1.803 m)   Wt 200 lb 12.8 oz (91.1 kg)   SpO2 98%   BMI 28.01 kg/m    Intake/Output Summary (Last 24 hours) at 08/12/2017 1753 Last data filed at 08/12/2017 1700 Gross per 24 hour  Intake 4304.08 ml  Output 1670 ml  Net 2634.08 ml   CI= 3.0  Doing well early postop  Remo Lipps C. Roxan Hockey, MD [facility] [phone]

## 2017-08-12 NOTE — Transfer of Care (Signed)
Immediate Anesthesia Transfer of Care Note  Patient: Stuart Gentry  Procedure(s) Performed: CORONARY ARTERY BYPASS GRAFTING (CABG) x six , using left internal mammory artery and right leg greater saphenous vein harvested endoscopically. (N/A Chest) TRANSESOPHAGEAL ECHOCARDIOGRAM (TEE) (N/A )  Patient Location: ICU  Anesthesia Type:General  Level of Consciousness: Patient remains intubated per anesthesia plan  Airway & Oxygen Therapy: Patient remains intubated per anesthesia plan and Patient placed on Ventilator (see vital sign flow sheet for setting)  Post-op Assessment: Report given to RN and Post -op Vital signs reviewed and stable  Post vital signs: Reviewed and stable  Last Vitals:  Vitals Value Taken Time  BP    Temp 35.7 C 08/12/2017  2:30 PM  Pulse 79 08/12/2017  2:30 PM  Resp 13 08/12/2017  2:30 PM  SpO2 96 % 08/12/2017  2:30 PM  Vitals shown include unvalidated device data.  Last Pain:  Vitals:   08/12/17 0500  TempSrc: Oral  PainSc:          Complications: No apparent anesthesia complications

## 2017-08-12 NOTE — Anesthesia Postprocedure Evaluation (Signed)
Anesthesia Post Note  Patient: Stuart Gentry  Procedure(s) Performed: CORONARY ARTERY BYPASS GRAFTING (CABG) x six , using left internal mammory artery and right leg greater saphenous vein harvested endoscopically. (N/A Chest) TRANSESOPHAGEAL ECHOCARDIOGRAM (TEE) (N/A )     Patient location during evaluation: SICU Anesthesia Type: General Level of consciousness: sedated Pain management: pain level controlled Vital Signs Assessment: post-procedure vital signs reviewed and stable Respiratory status: patient remains intubated per anesthesia plan Cardiovascular status: stable Postop Assessment: no apparent nausea or vomiting Anesthetic complications: no    Last Vitals:  Vitals:   08/12/17 1600 08/12/17 1615  BP: 97/64   Pulse: 80 80  Resp: 12 12  Temp: (!) 36 C (!) 36.1 C  SpO2: 99% 100%    Last Pain:  Vitals:   08/12/17 0500  TempSrc: Oral  PainSc:                  Tiajuana Amass

## 2017-08-12 NOTE — OR Nursing (Signed)
First call for 45 mins called to SICU charge nurse.

## 2017-08-12 NOTE — Anesthesia Procedure Notes (Signed)
Central Venous Catheter Insertion Performed by: Nolon Nations, MD, anesthesiologist Start/End4/15/2019 6:35 AM, 08/12/2017 6:50 AM Patient location: Pre-op. Preanesthetic checklist: patient identified, IV checked, site marked, risks and benefits discussed, surgical consent, monitors and equipment checked, pre-op evaluation, timeout performed and anesthesia consent Position: Trendelenburg Lidocaine 1% used for infiltration and patient sedated Hand hygiene performed  and maximum sterile barriers used  Catheter size: 8.5 Fr Sheath introducer Procedure performed using ultrasound guided technique. Ultrasound Notes:anatomy identified, needle tip was noted to be adjacent to the nerve/plexus identified, no ultrasound evidence of intravascular and/or intraneural injection and image(s) printed for medical record Attempts: 1 Following insertion, line sutured, dressing applied and Biopatch. Post procedure assessment: blood return through all ports, free fluid flow and no air  Patient tolerated the procedure well with no immediate complications.

## 2017-08-12 NOTE — Progress Notes (Signed)
Pt pre op chg bath and meds given, family at bedside and holding belongs glass, shoes, IS

## 2017-08-12 NOTE — OR Nursing (Signed)
20 min call, made to SICU charge nurse. Patient is going to RM 21.

## 2017-08-13 ENCOUNTER — Inpatient Hospital Stay (HOSPITAL_COMMUNITY): Payer: Medicare Other

## 2017-08-13 ENCOUNTER — Encounter (HOSPITAL_COMMUNITY): Payer: Self-pay | Admitting: Surgery

## 2017-08-13 LAB — BASIC METABOLIC PANEL
Anion gap: 7 (ref 5–15)
BUN: 14 mg/dL (ref 6–20)
CALCIUM: 8 mg/dL — AB (ref 8.9–10.3)
CO2: 20 mmol/L — ABNORMAL LOW (ref 22–32)
CREATININE: 0.91 mg/dL (ref 0.61–1.24)
Chloride: 108 mmol/L (ref 101–111)
GFR calc Af Amer: >60 mL/min (ref >60)
Glucose, Bld: 118 mg/dL — ABNORMAL HIGH (ref 65–99)
Potassium: 4 mmol/L (ref 3.5–5.1)
Sodium: 135 mmol/L (ref 135–145)

## 2017-08-13 LAB — POCT I-STAT, CHEM 8
BUN: 17 mg/dL (ref 6–20)
Calcium, Ion: 1.23 mmol/L (ref 1.15–1.40)
Chloride: 102 mmol/L (ref 101–111)
Creatinine, Ser: 1.1 mg/dL (ref 0.61–1.24)
Glucose, Bld: 108 mg/dL — ABNORMAL HIGH (ref 65–99)
HEMATOCRIT: 28 % — AB (ref 39.0–52.0)
HEMOGLOBIN: 9.5 g/dL — AB (ref 13.0–17.0)
POTASSIUM: 4.4 mmol/L (ref 3.5–5.1)
Sodium: 136 mmol/L (ref 135–145)
TCO2: 23 mmol/L (ref 22–32)

## 2017-08-13 LAB — GLUCOSE, CAPILLARY
GLUCOSE-CAPILLARY: 104 mg/dL — AB (ref 65–99)
GLUCOSE-CAPILLARY: 109 mg/dL — AB (ref 65–99)
GLUCOSE-CAPILLARY: 112 mg/dL — AB (ref 65–99)
GLUCOSE-CAPILLARY: 114 mg/dL — AB (ref 65–99)
GLUCOSE-CAPILLARY: 126 mg/dL — AB (ref 65–99)
GLUCOSE-CAPILLARY: 139 mg/dL — AB (ref 65–99)
GLUCOSE-CAPILLARY: 142 mg/dL — AB (ref 65–99)
GLUCOSE-CAPILLARY: 90 mg/dL (ref 65–99)
GLUCOSE-CAPILLARY: 90 mg/dL (ref 65–99)
Glucose-Capillary: 126 mg/dL — ABNORMAL HIGH (ref 65–99)
Glucose-Capillary: 150 mg/dL — ABNORMAL HIGH (ref 65–99)
Glucose-Capillary: 126 mg/dL — ABNORMAL HIGH (ref 65–99)
Glucose-Capillary: 93 mg/dL (ref 65–99)
Glucose-Capillary: 94 mg/dL (ref 65–99)
Glucose-Capillary: 79 mg/dL (ref 65–99)

## 2017-08-13 LAB — CREATININE, SERUM
CREATININE: 0.97 mg/dL (ref 0.61–1.24)
Creatinine, Ser: 1.15 mg/dL (ref 0.61–1.24)
GFR calc Af Amer: >60 mL/min (ref >60)

## 2017-08-13 LAB — CBC
HCT: 30.3 % — ABNORMAL LOW (ref 39.0–52.0)
HEMATOCRIT: 29.9 % — AB (ref 39.0–52.0)
HEMOGLOBIN: 10.2 g/dL — AB (ref 13.0–17.0)
Hemoglobin: 10.3 g/dL — ABNORMAL LOW (ref 13.0–17.0)
MCH: 30.8 pg (ref 26.0–34.0)
MCH: 31.9 pg (ref 26.0–34.0)
MCHC: 34 g/dL (ref 30.0–36.0)
MCHC: 34.1 g/dL (ref 30.0–36.0)
MCV: 90.7 fL (ref 78.0–100.0)
MCV: 93.4 fL (ref 78.0–100.0)
PLATELETS: 167 10*3/uL (ref 150–400)
Platelets: 165 10*3/uL (ref 150–400)
RBC: 3.34 MIL/uL — AB (ref 4.22–5.81)
RBC: 3.2 MIL/uL — AB (ref 4.22–5.81)
RDW: 14.7 % (ref 11.5–15.5)
RDW: 15.1 % (ref 11.5–15.5)
WBC: 11.9 10*3/uL — ABNORMAL HIGH (ref 4.0–10.5)
WBC: 11.5 10*3/uL — AB (ref 4.0–10.5)

## 2017-08-13 LAB — MAGNESIUM
MAGNESIUM: 2.4 mg/dL (ref 1.7–2.4)
MAGNESIUM: 2.3 mg/dL (ref 1.7–2.4)

## 2017-08-13 MED ORDER — INSULIN DETEMIR 100 UNIT/ML ~~LOC~~ SOLN
10.0000 [IU] | Freq: Once | SUBCUTANEOUS | Status: AC
  Administered 2017-08-13: 10 [IU] via SUBCUTANEOUS
  Filled 2017-08-13: qty 0.1

## 2017-08-13 MED ORDER — ENOXAPARIN SODIUM 40 MG/0.4ML ~~LOC~~ SOLN
40.0000 mg | Freq: Every day | SUBCUTANEOUS | Status: DC
  Administered 2017-08-13 – 2017-08-17 (×5): 40 mg via SUBCUTANEOUS
  Filled 2017-08-13 (×5): qty 0.4

## 2017-08-13 MED ORDER — INSULIN ASPART 100 UNIT/ML ~~LOC~~ SOLN
0.0000 [IU] | SUBCUTANEOUS | Status: DC
  Administered 2017-08-13: 2 [IU] via SUBCUTANEOUS

## 2017-08-13 MED ORDER — FUROSEMIDE 10 MG/ML IJ SOLN
40.0000 mg | Freq: Two times a day (BID) | INTRAMUSCULAR | Status: AC
Start: 2017-08-13 — End: 2017-08-13
  Administered 2017-08-13 (×2): 40 mg via INTRAVENOUS
  Filled 2017-08-13 (×2): qty 4

## 2017-08-13 MED ORDER — POTASSIUM CHLORIDE CRYS ER 20 MEQ PO TBCR
20.0000 meq | EXTENDED_RELEASE_TABLET | Freq: Two times a day (BID) | ORAL | Status: AC
  Administered 2017-08-13 (×2): 20 meq via ORAL
  Filled 2017-08-13 (×2): qty 1

## 2017-08-13 MED FILL — Sodium Bicarbonate IV Soln 8.4%: INTRAVENOUS | Qty: 50 | Status: AC

## 2017-08-13 MED FILL — Magnesium Sulfate Inj 50%: INTRAMUSCULAR | Qty: 10 | Status: AC

## 2017-08-13 MED FILL — Cefuroxime in Sterile Water Inj 1.5 GM/50ML: INTRAVENOUS | Qty: 50 | Status: AC

## 2017-08-13 MED FILL — Potassium Chloride Inj 2 mEq/ML: INTRAVENOUS | Qty: 40 | Status: AC

## 2017-08-13 MED FILL — Electrolyte-R (PH 7.4) Solution: INTRAVENOUS | Qty: 4000 | Status: AC

## 2017-08-13 MED FILL — Heparin Sodium (Porcine) 2 Unit/ML in Sodium Chloride 0.9%: INTRAMUSCULAR | Qty: 1000 | Status: AC

## 2017-08-13 MED FILL — Heparin Sodium (Porcine) Inj 1000 Unit/ML: INTRAMUSCULAR | Qty: 10 | Status: AC

## 2017-08-13 MED FILL — Mannitol IV Soln 20%: INTRAVENOUS | Qty: 500 | Status: AC

## 2017-08-13 MED FILL — Sodium Chloride IV Soln 0.9%: INTRAVENOUS | Qty: 2000 | Status: AC

## 2017-08-13 MED FILL — Dexmedetomidine HCl in NaCl 0.9% IV Soln 400 MCG/100ML: INTRAVENOUS | Qty: 100 | Status: AC

## 2017-08-13 MED FILL — Heparin Sodium (Porcine) Inj 1000 Unit/ML: INTRAMUSCULAR | Qty: 30 | Status: AC

## 2017-08-13 MED FILL — Thrombin For Soln 20000 Unit: CUTANEOUS | Qty: 1 | Status: AC

## 2017-08-13 MED FILL — Lidocaine HCl IV Inj 20 MG/ML: INTRAVENOUS | Qty: 5 | Status: AC

## 2017-08-13 NOTE — Care Management Note (Signed)
Case Management Note Marvetta Gibbons RN, BSN Unit 4E-Case Manager-- [place] coverage [phone]  Patient Details  Name: Stuart Gentry MRN: [id number] Date of Birth: [birth year]  Subjective/Objective:  Pt admitted with chest pain, per cath MVD found- s/p CABGx6 on 08/12/17                 Action/Plan: PTA pt lived at home with spouse- CM to follow post op for transition of care needs.   Expected Discharge Date:                  Expected Discharge Plan:     In-House Referral:     Discharge planning Services  CM Consult  Post Acute Care Choice:    Choice offered to:     DME Arranged:    DME Agency:     HH Arranged:    HH Agency:     Status of Service:  In process, will continue to follow  If discussed at Long Length of Stay Meetings, dates discussed:    Additional Comments:  Dawayne Patricia, RN 08/13/2017, 3:04 PM

## 2017-08-13 NOTE — Progress Notes (Signed)
TCTS BRIEF SICU PROGRESS NOTE  1 Day Post-Op  S/P Procedure(s) (LRB): CORONARY ARTERY BYPASS GRAFTING (CABG) x six , using left internal mammory artery and right leg greater saphenous vein harvested endoscopically. (N/A) TRANSESOPHAGEAL ECHOCARDIOGRAM (TEE) (N/A)   Stable day NSR w/ stable BP Breathing comfortably w/ O2 sats 91-96% on RA UOP > 100 mL/hr Labs okay  Plan: Continue current plan  Rexene Alberts, MD 08/13/2017 8:28 PM

## 2017-08-13 NOTE — Plan of Care (Signed)
Patient alert and oriented, family at bedside supportive.  Complaints of shoulder pain and surgical site pain as per patient rating 3/10 at this time.  Monitoring as per protocol.Utilizing ISP appropriately.

## 2017-08-13 NOTE — Progress Notes (Signed)
1 Day Post-Op Procedure(s) (LRB): CORONARY ARTERY BYPASS GRAFTING (CABG) x six , using left internal mammory artery and right leg greater saphenous vein harvested endoscopically. (N/A) TRANSESOPHAGEAL ECHOCARDIOGRAM (TEE) (N/A) Subjective: No complaints  Objective: Vital signs in last 24 hours: Temp:  [96.3 F (35.7 C)-99 F (37.2 C)] 98.4 F (36.9 C) (04/16 0700) Pulse Rate:  [69-92] 69 (04/16 0700) Cardiac Rhythm: Normal sinus rhythm;Bundle branch block;Heart block (04/16 0000) Resp:  [12-29] 25 (04/16 0700) BP: (83-150)/(41-76) 122/65 (04/16 0700) SpO2:  [92 %-100 %] 96 % (04/16 0700) Arterial Line BP: (66-165)/(46-67) 153/67 (04/16 0700) FiO2 (%):  [40 %-50 %] 40 % (04/15 1732) Weight:  [95.6 kg (210 lb 12.2 oz)] 95.6 kg (210 lb 12.2 oz) (04/16 0619)  Hemodynamic parameters for last 24 hours: PAP: (15-31)/(2-19) 31/13 CO:  [4.7 L/min-7.9 L/min] 7.9 L/min CI:  [2.2 L/min/m2-3.8 L/min/m2] 3.8 L/min/m2  Intake/Output from previous day: 04/15 0701 - 04/16 0700 In: 4856.3 [I.V.:3311.3; Blood:465; IV Piggyback:1080] Out: 2360 [Urine:1935; Emesis/NG output:15; Chest Tube:410] Intake/Output this shift: No intake/output data recorded.  General appearance: alert and cooperative Neurologic: intact Heart: regular rate and rhythm, S1, S2 normal, no murmur, click, rub or gallop Lungs: clear to auscultation bilaterally Abdomen: soft, non-tender; bowel sounds normal; no masses,  no organomegaly Extremities: edema mild Wound: dressings dry  Lab Results: Recent Labs    08/12/17 1401  08/12/17 2042 08/13/17 0233  WBC 11.7*  --   --  11.9*  HGB 9.8*   < > 10.2* 10.3*  HCT 28.8*   < > 30.0* 30.3*  PLT 171  --   --  167   < > = values in this interval not displayed.   BMET:  Recent Labs    08/12/17 0549  08/12/17 1229 08/12/17 1403 08/12/17 2042 08/13/17 0233  NA 137   < > 137 140 138  --   K 3.9   < > 5.0 4.5 4.1  --   CL 104   < > 104  --  105  --   CO2 24  --   --    --   --   --   GLUCOSE 104*   < > 120* 101* 133*  --   BUN 15   < > 14  --  15  --   CREATININE 1.15   < > 0.90  --  0.80 0.97  CALCIUM 9.3  --   --   --   --   --    < > = values in this interval not displayed.    PT/INR:  Recent Labs    08/12/17 1401  LABPROT 17.0*  INR 1.39   ABG    Component Value Date/Time   PHART 7.346 (L) 08/12/2017 1921   HCO3 19.5 (L) 08/12/2017 1921   TCO2 19 (L) 08/12/2017 2042   ACIDBASEDEF 6.0 (H) 08/12/2017 1921   O2SAT 97.0 08/12/2017 1921   CBG (last 3)  Recent Labs    08/13/17 0222 08/13/17 0328 08/13/17 0556  GLUCAP 126* 109* 93   CXR: clear  ECG: sinus, RBBB, no acute changes  Assessment/Plan: S/P Procedure(s) (LRB): CORONARY ARTERY BYPASS GRAFTING (CABG) x six , using left internal mammory artery and right leg greater saphenous vein harvested endoscopically. (N/A) TRANSESOPHAGEAL ECHOCARDIOGRAM (TEE) (N/A)   Hemodynamically stable POD 1. Continue Lopressor Mobilize Glucose under control and weaning insulin drip. Preop Hgb A1c was 4.3. Will give a small dose of Levemir this am to get off insulin drip. Diuresis  d/c tubes/lines Continue foley due to diuresing patient and patient in ICU See progression orders   LOS: 4 days    Stuart Gentry 08/13/2017

## 2017-08-13 NOTE — Addendum Note (Signed)
Addendum  created 08/13/17 1412 by Suzette Battiest, MD   Diagnosis association updated

## 2017-08-14 ENCOUNTER — Inpatient Hospital Stay (HOSPITAL_COMMUNITY): Payer: Medicare Other

## 2017-08-14 DIAGNOSIS — Z951 Presence of aortocoronary bypass graft: Secondary | ICD-10-CM

## 2017-08-14 LAB — GLUCOSE, CAPILLARY
GLUCOSE-CAPILLARY: 120 mg/dL — AB (ref 65–99)
Glucose-Capillary: 93 mg/dL (ref 65–99)
Glucose-Capillary: 112 mg/dL — ABNORMAL HIGH (ref 65–99)
Glucose-Capillary: 116 mg/dL — ABNORMAL HIGH (ref 65–99)

## 2017-08-14 LAB — BASIC METABOLIC PANEL
Anion gap: 7 (ref 5–15)
BUN: 21 mg/dL — ABNORMAL HIGH (ref 6–20)
CO2: 23 mmol/L (ref 22–32)
Calcium: 8.4 mg/dL — ABNORMAL LOW (ref 8.9–10.3)
Chloride: 104 mmol/L (ref 101–111)
Creatinine, Ser: 1.21 mg/dL (ref 0.61–1.24)
GFR calc Af Amer: >60 mL/min (ref >60)
GFR calc non Af Amer: 58 mL/min — ABNORMAL LOW (ref >60)
Glucose, Bld: 111 mg/dL — ABNORMAL HIGH (ref 65–99)
Potassium: 4.4 mmol/L (ref 3.5–5.1)
Sodium: 134 mmol/L — ABNORMAL LOW (ref 135–145)

## 2017-08-14 LAB — CBC
HCT: 29.3 % — ABNORMAL LOW (ref 39.0–52.0)
HEMOGLOBIN: 9.7 g/dL — AB (ref 13.0–17.0)
MCH: 31.3 pg (ref 26.0–34.0)
MCHC: 33.1 g/dL (ref 30.0–36.0)
MCV: 94.5 fL (ref 78.0–100.0)
PLATELETS: 154 10*3/uL (ref 150–400)
RBC: 3.1 MIL/uL — AB (ref 4.22–5.81)
RDW: 15.1 % (ref 11.5–15.5)
WBC: 9 10*3/uL (ref 4.0–10.5)

## 2017-08-14 MED ORDER — ACETAMINOPHEN 325 MG PO TABS: 650.0000 mg | ORAL_TABLET | Freq: Four times a day (QID) | ORAL | Status: DC | PRN

## 2017-08-14 MED ORDER — BISACODYL 5 MG PO TBEC
10.0000 mg | DELAYED_RELEASE_TABLET | Freq: Every day | ORAL | Status: DC | PRN
  Administered 2017-08-15 – 2017-08-16 (×2): 10 mg via ORAL
  Filled 2017-08-14 (×2): qty 2

## 2017-08-14 MED ORDER — OXYCODONE HCL 5 MG PO TABS
5.0000 mg | ORAL_TABLET | ORAL | Status: DC | PRN
Start: 2017-08-14 — End: 2017-08-18
  Administered 2017-08-14 – 2017-08-15 (×6): 10 mg via ORAL
  Filled 2017-08-14 (×6): qty 2

## 2017-08-14 MED ORDER — AMIODARONE HCL 200 MG PO TABS
400.0000 mg | ORAL_TABLET | Freq: Two times a day (BID) | ORAL | Status: DC
  Administered 2017-08-14 – 2017-08-18 (×8): 400 mg via ORAL
  Filled 2017-08-14 (×8): qty 2

## 2017-08-14 MED ORDER — SODIUM CHLORIDE 0.9% FLUSH
3.0000 mL | Freq: Two times a day (BID) | INTRAVENOUS | Status: DC
Start: 2017-08-14 — End: 2017-08-18
  Administered 2017-08-14 – 2017-08-17 (×7): 3 mL via INTRAVENOUS

## 2017-08-14 MED ORDER — FUROSEMIDE 40 MG PO TABS
40.0000 mg | ORAL_TABLET | Freq: Every day | ORAL | Status: DC
  Administered 2017-08-14: 40 mg via ORAL
  Filled 2017-08-14: qty 1

## 2017-08-14 MED ORDER — METOPROLOL TARTRATE 25 MG PO TABS
25.0000 mg | ORAL_TABLET | Freq: Two times a day (BID) | ORAL | Status: DC
  Administered 2017-08-14 – 2017-08-15 (×4): 25 mg via ORAL
  Filled 2017-08-14 (×4): qty 1

## 2017-08-14 MED ORDER — BISACODYL 10 MG RE SUPP: 10.0000 mg | Freq: Every day | RECTAL | Status: DC | PRN

## 2017-08-14 MED ORDER — SODIUM CHLORIDE 0.9% FLUSH: 3.0000 mL | INTRAVENOUS | Status: DC | PRN

## 2017-08-14 MED ORDER — DOCUSATE SODIUM 100 MG PO CAPS
200.0000 mg | ORAL_CAPSULE | Freq: Every day | ORAL | Status: DC
  Administered 2017-08-14 – 2017-08-17 (×4): 200 mg via ORAL
  Filled 2017-08-14 (×7): qty 2

## 2017-08-14 MED ORDER — POTASSIUM CHLORIDE CRYS ER 20 MEQ PO TBCR
20.0000 meq | EXTENDED_RELEASE_TABLET | Freq: Two times a day (BID) | ORAL | Status: DC
  Administered 2017-08-14 (×2): 20 meq via ORAL
  Filled 2017-08-14 (×2): qty 1

## 2017-08-14 MED ORDER — ASPIRIN EC 325 MG PO TBEC
325.0000 mg | DELAYED_RELEASE_TABLET | Freq: Every day | ORAL | Status: DC
Start: 2017-08-14 — End: 2017-08-18
  Administered 2017-08-14 – 2017-08-18 (×5): 325 mg via ORAL
  Filled 2017-08-14 (×5): qty 1

## 2017-08-14 MED ORDER — ONDANSETRON HCL 4 MG PO TABS: 4.0000 mg | ORAL_TABLET | Freq: Four times a day (QID) | ORAL | Status: DC | PRN

## 2017-08-14 MED ORDER — SODIUM CHLORIDE 0.9 % IV SOLN: 250.0000 mL | INTRAVENOUS | Status: DC | PRN

## 2017-08-14 MED ORDER — METOLAZONE 2.5 MG PO TABS
2.5000 mg | ORAL_TABLET | Freq: Every day | ORAL | Status: DC
  Administered 2017-08-14: 2.5 mg via ORAL
  Filled 2017-08-14: qty 1

## 2017-08-14 MED ORDER — TRAMADOL HCL 50 MG PO TABS
50.0000 mg | ORAL_TABLET | Freq: Four times a day (QID) | ORAL | Status: DC | PRN
  Administered 2017-08-15 (×2): 50 mg via ORAL
  Filled 2017-08-14 (×2): qty 1

## 2017-08-14 MED ORDER — MOVING RIGHT ALONG BOOK
Freq: Once | Status: AC
  Administered 2017-08-14: 09:00:00
  Filled 2017-08-14: qty 1

## 2017-08-14 MED ORDER — ONDANSETRON HCL 4 MG/2ML IJ SOLN: 4.0000 mg | Freq: Four times a day (QID) | INTRAMUSCULAR | Status: DC | PRN

## 2017-08-14 MED ORDER — PANTOPRAZOLE SODIUM 40 MG PO TBEC
40.0000 mg | DELAYED_RELEASE_TABLET | Freq: Every day | ORAL | Status: DC
  Administered 2017-08-14 – 2017-08-18 (×5): 40 mg via ORAL
  Filled 2017-08-14 (×5): qty 1

## 2017-08-14 NOTE — Plan of Care (Signed)
  Problem: Elimination: Goal: Will not experience complications related to urinary retention Outcome: Progressing Note:  Patient's foley d/c today. Urinated in urinal within 6 hours.   Problem: Activity: Goal: Risk for activity intolerance will decrease Outcome: Progressing Note:  Ambulated 740 ft tonight and tolerated well.

## 2017-08-14 NOTE — Progress Notes (Signed)
2 Days Post-Op Procedure(s) (LRB): CORONARY ARTERY BYPASS GRAFTING (CABG) x six , using left internal mammory artery and right leg greater saphenous vein harvested endoscopically. (N/A) TRANSESOPHAGEAL ECHOCARDIOGRAM (TEE) (N/A) Subjective: No complaints. Slept well  Objective: Vital signs in last 24 hours: Temp:  [97.9 F (36.6 C)-98.6 F (37 C)] 98.4 F (36.9 C) (04/17 0800) Pulse Rate:  [43-91] 71 (04/17 0800) Cardiac Rhythm: (P) Bundle branch block;Normal sinus rhythm (04/17 0800) Resp:  [7-25] 20 (04/17 0800) BP: (103-138)/(56-79) 123/65 (04/17 0800) SpO2:  [93 %-99 %] 93 % (04/17 0800) Arterial Line BP: (103-164)/(51-67) 156/59 (04/16 1400) Weight:  [94.6 kg (208 lb 8.9 oz)] 94.6 kg (208 lb 8.9 oz) (04/17 0600)  Hemodynamic parameters for last 24 hours: PAP: (26)/(12) 26/12  Intake/Output from previous day: 04/16 0701 - 04/17 0700 In: 1172.2 [P.O.:600; I.V.:272.2; IV Piggyback:300] Out: 1850 [Urine:1850] Intake/Output this shift: No intake/output data recorded.  General appearance: alert and cooperative Neurologic: intact Heart: regular rate and rhythm, S1, S2 normal, no murmur, click, rub or gallop Lungs: clear to auscultation bilaterally Extremities: edema mild Wound: incisions ok  Lab Results: Recent Labs    08/13/17 1723 08/14/17 0425  WBC 11.5* 9.0  HGB 10.2* 9.7*  HCT 29.9* 29.3*  PLT 165 154   BMET:  Recent Labs    08/13/17 0234 08/13/17 1721 08/13/17 1723 08/14/17 0425  NA 135 136  --  134*  K 4.0 4.4  --  4.4  CL 108 102  --  104  CO2 20*  --   --  23  GLUCOSE 118* 108*  --  111*  BUN 14 17  --  21*  CREATININE 0.91 1.10 1.15 1.21  CALCIUM 8.0*  --   --  8.4*    PT/INR:  Recent Labs    08/12/17 1401  LABPROT 17.0*  INR 1.39   ABG    Component Value Date/Time   PHART 7.346 (L) 08/12/2017 1921   HCO3 19.5 (L) 08/12/2017 1921   TCO2 23 08/13/2017 1721   ACIDBASEDEF 6.0 (H) 08/12/2017 1921   O2SAT 97.0 08/12/2017 1921   CBG  (last 3)  Recent Labs    08/13/17 2034 08/14/17 0004 08/14/17 0424  GLUCAP 139* 120* 93   CXR: mild bibasilar atelectasis. May be small right apical ptx.  Assessment/Plan: S/P Procedure(s) (LRB): CORONARY ARTERY BYPASS GRAFTING (CABG) x six , using left internal mammory artery and right leg greater saphenous vein harvested endoscopically. (N/A) TRANSESOPHAGEAL ECHOCARDIOGRAM (TEE) (N/A)  POD 2 Hemodynamically stable in sinus rhythm. Continue Lopressor.  Mild volume excess: continue diuresis  Glucose under good control. No hx of DM.  DC sleeve and foley  Transfer to 4E and continue IS, ambulation.  LOS: 5 days    Gaye Pollack 08/14/2017

## 2017-08-14 NOTE — Progress Notes (Signed)
Patient ID: Stuart Gentry, male   DOB: 24-Jan-1946, 72 y.o.   MRN: [id number] TCTS Evening Rounds:  Hemodynamically stable  Went into atrial fibrillation this afternoon with rate 90-110. Will start oral amiodarone since his central line is out.  Urine output ok Says he is feeling ok.

## 2017-08-15 LAB — BPAM RBC
BLOOD PRODUCT EXPIRATION DATE: 201905202359
BLOOD PRODUCT EXPIRATION DATE: 201905222359
Blood Product Expiration Date: 201904272359
Blood Product Expiration Date: 201904272359
Blood Product Expiration Date: 201905202359
Blood Product Expiration Date: 201905222359
ISSUE DATE / TIME: 201904150836
ISSUE DATE / TIME: 201904150836
ISSUE DATE / TIME: 201904150959
ISSUE DATE / TIME: 201904150959
UNIT TYPE AND RH: 9500
UNIT TYPE AND RH: 9500
UNIT TYPE AND RH: 9500
UNIT TYPE AND RH: 9500
Unit Type and Rh: 9500
Unit Type and Rh: 9500

## 2017-08-15 LAB — TYPE AND SCREEN
ABO/RH(D): O NEG
Antibody Screen: NEGATIVE
UNIT DIVISION: 0
UNIT DIVISION: 0
UNIT DIVISION: 0
Unit division: 0
Unit division: 0
Unit division: 0

## 2017-08-15 LAB — CBC
HCT: 30.7 % — ABNORMAL LOW (ref 39.0–52.0)
Hemoglobin: 10 g/dL — ABNORMAL LOW (ref 13.0–17.0)
MCH: 31 pg (ref 26.0–34.0)
MCHC: 32.6 g/dL (ref 30.0–36.0)
MCV: 95 fL (ref 78.0–100.0)
PLATELETS: 186 10*3/uL (ref 150–400)
RBC: 3.23 MIL/uL — ABNORMAL LOW (ref 4.22–5.81)
RDW: 14.2 % (ref 11.5–15.5)
WBC: 9 10*3/uL (ref 4.0–10.5)

## 2017-08-15 LAB — BASIC METABOLIC PANEL
Anion gap: 8 (ref 5–15)
BUN: 25 mg/dL — AB (ref 6–20)
CHLORIDE: 103 mmol/L (ref 101–111)
CO2: 24 mmol/L (ref 22–32)
CREATININE: 1.29 mg/dL — AB (ref 0.61–1.24)
Calcium: 8.7 mg/dL — ABNORMAL LOW (ref 8.9–10.3)
GFR calc Af Amer: >60 mL/min (ref >60)
GFR calc non Af Amer: 54 mL/min — ABNORMAL LOW (ref >60)
GLUCOSE: 116 mg/dL — AB (ref 65–99)
POTASSIUM: 5 mmol/L (ref 3.5–5.1)
SODIUM: 135 mmol/L (ref 135–145)

## 2017-08-15 NOTE — Progress Notes (Signed)
Report given to [facility] on 4E. Pt to be transferred after shift change to 4E20.

## 2017-08-15 NOTE — Progress Notes (Signed)
3 Days Post-Op Procedure(s) (LRB): CORONARY ARTERY BYPASS GRAFTING (CABG) x six , using left internal mammory artery and right leg greater saphenous vein harvested endoscopically. (N/A) TRANSESOPHAGEAL ECHOCARDIOGRAM (TEE) (N/A) Subjective: Sore but otherwise feels ok. Ambulated this am without difficulty  Objective: Vital signs in last 24 hours: Temp:  [98.4 F (36.9 C)-99 F (37.2 C)] 99 F (37.2 C) (04/18 0408) Pulse Rate:  [58-96] 62 (04/18 0703) Cardiac Rhythm: Atrial fibrillation (04/18 0400) Resp:  [12-25] 21 (04/18 0703) BP: (91-136)/(58-83) 116/61 (04/18 0703) SpO2:  [90 %-98 %] 94 % (04/18 0703) Weight:  [93.1 kg (205 lb 3.2 oz)] 93.1 kg (205 lb 3.2 oz) (04/18 0500)  Hemodynamic parameters for last 24 hours:    Intake/Output from previous day: 04/17 0701 - 04/18 0700 In: 150 [P.O.:150] Out: 740 [Urine:740] Intake/Output this shift: No intake/output data recorded.  General appearance: alert and cooperative Heart: regular rate and rhythm, S1, S2 normal, no murmur, click, rub or gallop Lungs: clear to auscultation bilaterally Extremities: edema mild in ankles Wound: incisions ok  Lab Results: Recent Labs    08/14/17 0425 08/15/17 0300  WBC 9.0 9.0  HGB 9.7* 10.0*  HCT 29.3* 30.7*  PLT 154 186   BMET:  Recent Labs    08/14/17 0425 08/15/17 0300  NA 134* 135  K 4.4 5.0  CL 104 103  CO2 23 24  GLUCOSE 111* 116*  BUN 21* 25*  CREATININE 1.21 1.29*  CALCIUM 8.4* 8.7*    PT/INR:  Recent Labs    08/12/17 1401  LABPROT 17.0*  INR 1.39   ABG    Component Value Date/Time   PHART 7.346 (L) 08/12/2017 1921   HCO3 19.5 (L) 08/12/2017 1921   TCO2 23 08/13/2017 1721   ACIDBASEDEF 6.0 (H) 08/12/2017 1921   O2SAT 97.0 08/12/2017 1921   CBG (last 3)  Recent Labs    08/14/17 0424 08/14/17 0758 08/14/17 1238  GLUCAP 93 112* 116*    Assessment/Plan: S/P Procedure(s) (LRB): CORONARY ARTERY BYPASS GRAFTING (CABG) x six , using left internal  mammory artery and right leg greater saphenous vein harvested endoscopically. (N/A) TRANSESOPHAGEAL ECHOCARDIOGRAM (TEE) (N/A)  POD 3 Hemodynamically stable in sinus rhythm with sinus arrhythmia this am. Postop atrial fibrillation yesterday converted overnight on oral amio. Will plan to continue oral amio for 4 weeks postop.   Mild volume excess: wt is still 5 lbs over preop. Creat bump this am so will hold off on diuretic today and if creat ok can resume tomorrow. K+ 5.0 so hold off on further K+.  Continue IS, ambulation Awaiting bed on 4E.   LOS: 6 days    Gaye Pollack 08/15/2017

## 2017-08-16 LAB — BASIC METABOLIC PANEL
Anion gap: 8 (ref 5–15)
BUN: 28 mg/dL — AB (ref 6–20)
CHLORIDE: 102 mmol/L (ref 101–111)
CO2: 24 mmol/L (ref 22–32)
CREATININE: 1.18 mg/dL (ref 0.61–1.24)
Calcium: 8.7 mg/dL — ABNORMAL LOW (ref 8.9–10.3)
GFR calc Af Amer: >60 mL/min (ref >60)
GFR calc non Af Amer: 60 mL/min — ABNORMAL LOW (ref >60)
Glucose, Bld: 111 mg/dL — ABNORMAL HIGH (ref 65–99)
POTASSIUM: 4.2 mmol/L (ref 3.5–5.1)
Sodium: 134 mmol/L — ABNORMAL LOW (ref 135–145)

## 2017-08-16 MED ORDER — METOPROLOL TARTRATE 25 MG PO TABS
37.5000 mg | ORAL_TABLET | Freq: Two times a day (BID) | ORAL | Status: DC
  Administered 2017-08-16 – 2017-08-18 (×5): 37.5 mg via ORAL
  Filled 2017-08-16 (×5): qty 1

## 2017-08-16 NOTE — Progress Notes (Signed)
Cardiac Rehab [phone] Pt states that he has walked twice this am using walker. He has been walking 940 feet each walk on room air. He denies any problems. Completed discharge education with pt and wife. We discussed sternal precautions, exercise guidelines, risk factors , modifications, heart healthy diet and Outpt. CRP. He voices understanding. Will send referral to Outpt.  CRP in [place]. Placed heart surgery discharge video on for them to view. We will continue to follow pt.

## 2017-08-16 NOTE — Discharge Summary (Signed)
Physician Discharge Summary  Patient ID: Stuart Gentry MRN: [id number] DOB/AGE: 11-22-1945 72 y.o.  Admit date: 08/09/2017 Discharge date: 08/18/2017  Admission Diagnoses:  Patient Active Problem List   Diagnosis Date Noted  . CAD (coronary artery disease), native coronary artery 08/09/2017  . CAD (coronary artery disease) 08/09/2017  . Coronary artery disease involving native coronary artery of native heart with unstable angina pectoris ([place])   . Coronary artery calcification seen on CAT scan   . Hypertension   . Ulcerative colitis   . Hypercholesteremia   . Hypothyroidism    Discharge Diagnoses:   Patient Active Problem List   Diagnosis Date Noted  . S/P CABG x 6 08/12/2017  . CAD (coronary artery disease), native coronary artery 08/09/2017  . CAD (coronary artery disease) 08/09/2017  . Coronary artery disease involving native coronary artery of native heart with unstable angina pectoris ([employer])   . Coronary artery calcification seen on CAT scan   . Hypertension   . Ulcerative colitis   . Hypercholesteremia   . Hypothyroidism    Discharged Condition: good  History of Present Illness:  Stuart Gentry is a 72 yo male with known history of Hypertension, Ulcerative Colitis, Hypercholesterolemia, and Hypothyroidism.  Approximately 4 months ago he developed significant dyspnea with exertion of associated chest tightness with some radiation into her left arm.  He presented to his PCP for evaluation who recommended stress testing.  Myoview study showed ischemia in the LAD territory.  It was felt cardiac catheterization would be indicated.  This was performed on 08/09/2017 and showed evidence of severe 3 vessel CAD.  It was felt coronary bypass grafting would be indicated and TCTS consult was requested.  She was evaluated by Dr. Cyndia Bent who was in agreement the patient would benefit from coronary bypass grafting.  The risks and benefits of the procedure were explained to the patient and he  was agreeable to proceed.    Hospital Course:   He remained chest pain free during hospitalization.  He was taken to the operating room on 08/12/2017 and underwent CABG x 6 utilizing LIMA to LAD, Sequential SVG to Diagonal 1 and Diagonal 2, Sequential SVG to OM1 and OM3, and saphenous vein graft to PDA.  He also underwent endoscopic harvest of the greater saphenous vein from his right leg.  He tolerated the procedure without difficulty and was taken to the SICU in stable condition.  He was extubated the evening of surgery.  During his stay in the SICU his chest tubes, arterial lines, and foley catheter were removed without difficulty.  He developed atrial fibrillation.  He was started on an oral regimen of Amiodarone with successful conversion to NSR.  He was medically stable and transferred to the telemetry unit on 08/15/2017.  He continued to make progress.  He was restarted on his home Ulcerative Colitis medication.  He continued to maintain NSR and no anticoagulation was indicated.  His pacing wires were removed without difficulty.  His pain is well controlled.  He is ambulating independently.  He is tolerating a heart healthy diet.  He is medically stable for discharge home today.  Significant Diagnostic Studies: angiography:    Prox RCA to Mid RCA lesion is 50% stenosed.  Mid RCA-1 lesion is 80% stenosed.  Mid RCA-2 lesion is 60% stenosed.  Mid RCA to Dist RCA lesion is 20% stenosed.  Mid Cx lesion is 99% stenosed.  Ost 2nd Mrg lesion is 99% stenosed.  Ost 1st Mrg lesion is 90% stenosed.  Ost Cx lesion is 50% stenosed.  Prox Cx lesion is 50% stenosed.  Prox LAD lesion is 100% stenosed.  Ost LAD to Prox LAD lesion is 20% stenosed.  Ost 1st Diag lesion is 90% stenosed.  The left ventricular systolic function is normal.  LV end diastolic pressure is normal.  The left ventricular ejection fraction is 50-55% by visual estimate.  There is no mitral valve regurgitation.   1.  Severe triple vessel CAD with CTO of the mid LAD. The mid LAD fills from right to left collaterals.  2. High grade stenosis in the mid segment of the large, dominant RCA 3. High grade stenosis in the mid Circumflex, OM1 and OM2.  4. High grade stenosis large Diagonal branch.  5. Preserved LV systolic function  Treatments: surgery:   1. Median Sternotomy 2. Extracorporeal circulation 3.   Coronary artery bypass grafting x 6   Left internal mammary graft to the LAD  Sequential SVG to Diagonal 1 and Diagonal 2  Sequential SVG to OM1 and OM3  SVG to PDA 4.   Endoscopic vein harvest from the right leg  Discharge Exam: Blood pressure 138/76, pulse (!) 58, temperature 98 F (36.7 C), temperature source Oral, resp. rate (!) 24, height 5\' 11"  (1.803 m), weight 200 lb (90.7 kg), SpO2 95 %.  General appearance: alert, cooperative and no distress Heart: regular rate and rhythm Lungs: clear to auscultation bilaterally Abdomen: soft, non-tender; bowel sounds normal; no masses,  no organomegaly Extremities: edema trace Wound: clean and dry  Disposition: Home  Discharge Medications:  The patient has been discharged on:   1.Beta Blocker:  Yes [ x  ]                              No   [   ]                              If No, reason:  2.Ace Inhibitor/ARB: Yes [   ]                                     No  [  x  ]                                     If No, reason: labile BP  3.Statin:   Yes [ x  ]                  No  [   ]                  If No, reason:  4.Ecasa:  Yes  [ x  ]                  No   [   ]                  If No, reason:     Discharge Instructions    Amb Referral to Cardiac Rehabilitation   Complete by:  As directed    Diagnosis:  CABG   CABG X ___:  6 Comment - LIMA to LAD     Allergies as of 08/18/2017   No  Known Allergies     Medication List    STOP taking these medications   amLODipine 10 MG tablet Commonly known as:  NORVASC   atenolol 25 MG  tablet Commonly known as:  TENORMIN   benazepril 40 MG tablet Commonly known as:  LOTENSIN   hydrochlorothiazide 25 MG tablet Commonly known as:  HYDRODIURIL   nitroGLYCERIN 0.4 MG SL tablet Commonly known as:  NITROSTAT     TAKE these medications   acetaminophen 325 MG tablet Commonly known as:  TYLENOL Take 2 tablets (650 mg total) by mouth every 6 (six) hours as needed for mild pain.   amiodarone 400 MG tablet Commonly known as:  PACERONE Take 1 tablet (400 mg total) by mouth 2 (two) times daily. X 14 days, then decrease 200 (1/2 tab) by mouth 2 times daily   aspirin EC 81 MG tablet Take 1 tablet (81 mg total) by mouth daily.   atorvastatin 80 MG tablet Commonly known as:  LIPITOR Take 1 tablet (80 mg total) by mouth daily at 6 PM. What changed:    medication strength  how much to take  when to take this   esomeprazole 20 MG capsule Commonly known as:  NEXIUM Take 20 mg by mouth every morning.   mesalamine 4 g enema Commonly known as:  ROWASA Place 60 mLs (4 g total) rectally at bedtime. Place 60 mls rectally at bedtime What changed:    when to take this  reasons to take this  additional instructions   Metoprolol Tartrate 37.5 MG Tabs Take 37.5 mg by mouth 2 (two) times daily.   multivitamin tablet Take 1 tablet by mouth daily.   polyethylene glycol packet Commonly known as:  MIRALAX / GLYCOLAX Take 17 g by mouth daily.   sulfaSALAzine 500 MG tablet Commonly known as:  AZULFIDINE TAKE 4 TABLETS BY MOUTH 3  TIMES DAILY   traMADol 50 MG tablet Commonly known as:  ULTRAM Take 1 tablet (50 mg total) by mouth every 6 (six) hours as needed for moderate pain.            Durable Medical Equipment  (From admission, onward)        Start     Ordered   08/17/17 1103  For home use only DME Walker rolling  Once    Question:  Patient needs a walker to treat with the following condition  Answer:  Post-operative state   08/17/17 1104      Follow-up Information    Gaye Pollack, MD Follow up in 4 week(s).   Specialty:  Cardiothoracic Surgery Why:  Office will contact you with appointment date and time.  Please get CXR 30 min prior to your appointment at [facility] located on the first floor of our office building Contact information: [address] [place] [address] [phone]        [facility] Follow up on 08/26/2017.   Specialty:  Cardiothoracic Surgery Why:  Appointment is at 10:00 for suture removal Contact information: [address] [place], [place] [phone]          Signed: Ellwood Handler 08/18/2017, 7:34 AM

## 2017-08-16 NOTE — Progress Notes (Signed)
      [address]       [place] [address]             [phone]      4 Days Post-Op Procedure(s) (LRB): CORONARY ARTERY BYPASS GRAFTING (CABG) x six , using left internal mammory artery and right leg greater saphenous vein harvested endoscopically. (N/A) TRANSESOPHAGEAL ECHOCARDIOGRAM (TEE) (N/A) Subjective: Feels okay this morning. Worried about his ulcerative colitis medication.   Objective: Vital signs in last 24 hours: Temp:  [98.5 F (36.9 C)-98.8 F (37.1 C)] 98.8 F (37.1 C) (04/19 0347) Pulse Rate:  [62-65] 62 (04/18 1600) Cardiac Rhythm: Normal sinus rhythm (04/18 2005) Resp:  [20-22] 20 (04/18 1600) BP: (104-151)/(55-76) 113/69 (04/19 0347) SpO2:  [87 %-96 %] 87 % (04/18 1600) Weight:  [202 lb 13.2 oz (92 kg)] 202 lb 13.2 oz (92 kg) (04/18 1958)     Intake/Output from previous day: 04/18 0701 - 04/19 0700 In: 245 [P.O.:245] Out: 825 [Urine:825] Intake/Output this shift: No intake/output data recorded.  General appearance: alert, cooperative and no distress Heart: irregularly irregular rhythm Lungs: clear to auscultation bilaterally Abdomen: soft, non-tender; bowel sounds normal; no masses,  no organomegaly Extremities: extremities normal, atraumatic, no cyanosis or edema Wound: clean and dry  Lab Results: Recent Labs    08/14/17 0425 08/15/17 0300  WBC 9.0 9.0  HGB 9.7* 10.0*  HCT 29.3* 30.7*  PLT 154 186   BMET:  Recent Labs    08/15/17 0300 08/16/17 0211  NA 135 134*  K 5.0 4.2  CL 103 102  CO2 24 24  GLUCOSE 116* 111*  BUN 25* 28*  CREATININE 1.29* 1.18  CALCIUM 8.7* 8.7*    PT/INR: No results for input(s): LABPROT, INR in the last 72 hours. ABG    Component Value Date/Time   PHART 7.346 (L) 08/12/2017 1921   HCO3 19.5 (L) 08/12/2017 1921   TCO2 23 08/13/2017 1721   ACIDBASEDEF 6.0 (H) 08/12/2017 1921   O2SAT 97.0 08/12/2017 1921   CBG (last 3)  Recent Labs    08/14/17 0424 08/14/17 0758 08/14/17 1238    GLUCAP 93 112* 116*    Assessment/Plan: S/P Procedure(s) (LRB): CORONARY ARTERY BYPASS GRAFTING (CABG) x six , using left internal mammory artery and right leg greater saphenous vein harvested endoscopically. (N/A) TRANSESOPHAGEAL ECHOCARDIOGRAM (TEE) (N/A)  1. CV-Appears to be in rate controlled a-fib. BP well controlled. Continue oral Amio, ASA, Lopressor, and lipitor. Increased Lopressor. Keep epicardial pacing wires.  2. Pulm-No new CXR to review. Tolerating 2L [place] with good oxygenation. Wean oxygen as tolerated. Continue use of incentive spirometer.  3. Renal-creatinine 1.18, electrolytes okay. Not on lasix however diuresing well.  4. H and H has been stable, Platelets trending up. 5. Endo-blood glucose well controlled.  6. UC- patient is asking to be put back on medications. Can probably restart the sulfasalazine but would hold off on the mesalamine enema for now.   Plan: Continue to work on rhythm control and titration of medication. Continue to ambulate in the halls. Encourage use of incentive spirometer.    LOS: 7 days    Elgie Collard 08/16/2017

## 2017-08-17 MED ORDER — TRAMADOL HCL 50 MG PO TABS: 50.0000 mg | ORAL_TABLET | Freq: Four times a day (QID) | ORAL | 0 refills | Status: DC | PRN

## 2017-08-17 MED ORDER — ACETAMINOPHEN 325 MG PO TABS: 650.0000 mg | ORAL_TABLET | Freq: Four times a day (QID) | ORAL | Status: AC | PRN

## 2017-08-17 MED ORDER — AMIODARONE HCL 400 MG PO TABS: 400.0000 mg | ORAL_TABLET | Freq: Two times a day (BID) | ORAL | 1 refills | Status: DC

## 2017-08-17 MED ORDER — ATORVASTATIN CALCIUM 80 MG PO TABS: 80.0000 mg | ORAL_TABLET | Freq: Every day | ORAL | 3 refills | Status: DC

## 2017-08-17 MED ORDER — METOPROLOL TARTRATE 37.5 MG PO TABS: 37.5000 mg | ORAL_TABLET | Freq: Two times a day (BID) | ORAL | 3 refills | Status: DC

## 2017-08-17 NOTE — Progress Notes (Signed)
Patient pacing wires pulled. No issues. Vitals documented in the computer. 1 hour of bedrest completed. Will continue to monitor.   Ocean Kearley, Mervin Kung RN

## 2017-08-17 NOTE — Progress Notes (Addendum)
      [address]       [place] [address]             [phone]      5 Days Post-Op Procedure(s) (LRB): CORONARY ARTERY BYPASS GRAFTING (CABG) x six , using left internal mammory artery and right leg greater saphenous vein harvested endoscopically. (N/A) TRANSESOPHAGEAL ECHOCARDIOGRAM (TEE) (N/A)   Subjective:  No new complaints.  He is feeling great and wants to go home today.  + ambulation    Objective: Vital signs in last 24 hours: Temp:  [98.3 F (36.8 C)-98.8 F (37.1 C)] 98.3 F (36.8 C) (04/20 0356) Pulse Rate:  [67-104] 67 (04/20 0835) Cardiac Rhythm: Normal sinus rhythm (04/20 0835) Resp:  [16-30] 30 (04/20 0356) BP: (130-153)/(70-89) 146/89 (04/20 0835) SpO2:  [90 %-98 %] 90 % (04/20 0356) Weight:  [202 lb 4.8 oz (91.8 kg)] 202 lb 4.8 oz (91.8 kg) (04/20 0356)  Intake/Output from previous day: 04/19 0701 - 04/20 0700 In: 120 [P.O.:120] Out: 225 [Urine:225]  General appearance: alert, cooperative and no distress Heart: regular rate and rhythm Lungs: clear to auscultation bilaterally Abdomen: soft, non-tender; bowel sounds normal; no masses,  no organomegaly Extremities: edema trace Wound: clean and dry  Lab Results: Recent Labs    08/15/17 0300  WBC 9.0  HGB 10.0*  HCT 30.7*  PLT 186   BMET:  Recent Labs    08/15/17 0300 08/16/17 0211  NA 135 134*  K 5.0 4.2  CL 103 102  CO2 24 24  GLUCOSE 116* 111*  BUN 25* 28*  CREATININE 1.29* 1.18  CALCIUM 8.7* 8.7*    PT/INR: No results for input(s): LABPROT, INR in the last 72 hours. ABG    Component Value Date/Time   PHART 7.346 (L) 08/12/2017 1921   HCO3 19.5 (L) 08/12/2017 1921   TCO2 23 08/13/2017 1721   ACIDBASEDEF 6.0 (H) 08/12/2017 1921   O2SAT 97.0 08/12/2017 1921   CBG (last 3)  Recent Labs    08/14/17 1238  GLUCAP 116*    Assessment/Plan: S/P Procedure(s) (LRB): CORONARY ARTERY BYPASS GRAFTING (CABG) x six , using left internal mammory artery and right leg  greater saphenous vein harvested endoscopically. (N/A) TRANSESOPHAGEAL ECHOCARDIOGRAM (TEE) (N/A)  1. CV- PAF, currently in NSR- on Amiodarone, Lopressor- will discuss need for anticoagulation with Dr. Roxy Manns 2. Pulm- no acute issues, continue IS 3. Renal- creatinine was trending down, weight is trending down, not currently on Lasix 4. GI- Patient with UC, on home medications, passing gas, does not feel like he needs to move his bowels 5.Dispo- patient stable, will d/c EPW today, discuss need for anticoagulation, possibly d/c later today vs tomorrow AM   LOS: 8 days    Ellwood Handler 08/17/2017   I have seen and examined the patient and agree with the assessment and plan as outlined.  Tentatively plan d/c home in am tomorrow if rhythm stable.  Maintaining NSR last 24 hours.  Will not plan to start anticoagulation unless Afib recurs.  Rexene Alberts, MD 08/17/2017 3:04 PM

## 2017-08-17 NOTE — Progress Notes (Signed)
    Subjective:  Denies SSCP, palpitations or Dyspnea Wants to go home but has not had BM UC meds have been held   Objective:  Vitals:   08/16/17 1717 08/16/17 1954 08/17/17 0356 08/17/17 0835  BP: 131/74 130/73 (!) 153/70 (!) 146/89  Pulse: 68 67 79 67  Resp: 16 (!) 21 (!) 30   Temp:  98.8 F (37.1 C) 98.3 F (36.8 C)   TempSrc:  Oral Oral   SpO2: 94% 91% 90%   Weight:   202 lb 4.8 oz (91.8 kg)   Height:        Intake/Output from previous day: No intake or output data in the 24 hours ending 08/17/17 1154  Physical Exam: Affect appropriate Healthy:  appears stated age HEENT: normal Neck supple with no adenopathy JVP normal no bruits no thyromegaly Lungs clear with no wheezing and good diaphragmatic motion Heart:  S1/S2 no murmur, no rub, gallop or click PMI normal post sternotomy healing well sutures for chest tube  Abdomen: benighn, BS positve, no tenderness, no AAA no bruit.  No HSM or HJR Distal pulses intact with no bruits No edema Neuro non-focal Skin warm and dry No muscular weakness  Lab Results: Basic Metabolic Panel: Recent Labs    08/15/17 0300 08/16/17 0211  NA 135 134*  K 5.0 4.2  CL 103 102  CO2 24 24  GLUCOSE 116* 111*  BUN 25* 28*  CREATININE 1.29* 1.18  CALCIUM 8.7* 8.7*   Liver Function Tests: No results for input(s): AST, ALT, ALKPHOS, BILITOT, PROT, ALBUMIN in the last 72 hours. No results for input(s): LIPASE, AMYLASE in the last 72 hours. CBC: Recent Labs    08/15/17 0300  WBC 9.0  HGB 10.0*  HCT 30.7*  MCV 95.0  PLT 186   Imaging: No results found.  Cardiac Studies:  ECG: SR RBBB LAFB    Telemetry:  NSR 08/17/2017   Echo: EF 55-60%   Medications:   . amiodarone  400 mg Oral BID  . aspirin EC  325 mg Oral Daily  . atorvastatin  80 mg Oral q1800  . docusate sodium  200 mg Oral Daily  . enoxaparin (LOVENOX) injection  40 mg Subcutaneous QHS  . metoprolol tartrate  37.5 mg Oral BID  . pantoprazole  40 mg Oral QAC  breakfast  . sodium chloride flush  3 mL Intravenous Q12H     . sodium chloride      Assessment/Plan:  CAD/CABG:  With LIMA to LAD SVG PDA SVG Sequential D1/D3 and SVG Sequential OM1/OM2 cotinue ASA And statin   PAF: self limited cotinue beta blocker and amiodarone decrease to 200 mg bid in 2 weeks  UC: ulcerative colitis ? Resume Rowasa and azulfidine has had laxative d/c home after has BM   Jenkins Rouge 08/17/2017, 11:54 AM

## 2017-08-17 NOTE — Progress Notes (Addendum)
CARDIAC REHAB PHASE I   PRE:  Rate/Rhythm: 69 sinus rhythm  BP:  Supine:   Sitting: 150/75  Standing:    SaO2: 91% ra   MODE:  Ambulation: 940 ft   POST:  Rate/Rhythem: 85 sinus rhythm  BP:  Supine:   Sitting: 162/82  Standing:    SaO2: 95% ra   Pt ambulated in hallway x1 assist.  Pt did not use rolling walker however pt has been using on previous ambulation especially independent walks.  Pt and wife request for home use.  RN made aware.  Education reinforced.  Questions answered.  Pt returned to chair, call light in reach.  Pt instructed and coached in IS use.  Pt verbalized understanding.    [employer]

## 2017-08-18 MED ORDER — AMIODARONE HCL 400 MG PO TABS: 400.0000 mg | ORAL_TABLET | Freq: Two times a day (BID) | ORAL | 1 refills | Status: DC

## 2017-08-18 NOTE — Discharge Instructions (Signed)

## 2017-08-18 NOTE — Progress Notes (Signed)
Pt states he does not want to wait for his walker to come before he leaves. Pt states he has a walker at home and he would like to leave now. Pt verbalizes understanding that he should wait but does not wish to wait at this time. Wife at bedside to drive pt home. NAD.

## 2017-08-18 NOTE — Care Management Note (Signed)
Case Management Note Marvetta Gibbons RN, BSN Unit 4E-Case Manager-- [place] coverage [phone]  Patient Details  Name: Stuart Gentry MRN: [id number] Date of Birth: 1945/08/09  Subjective/Objective:  Pt admitted with chest pain, per cath MVD found- s/p CABGx6 on 08/12/17                 Action/Plan: PTA pt lived at home with spouse- CM to follow post op for transition of care needs.   Expected Discharge Date:  08/18/17               Expected Discharge Plan:  Home/Self Care  In-House Referral:  NA  Discharge planning Services  CM Consult  Post Acute Care Choice:  Durable Medical Equipment Choice offered to:  Patient  DME Arranged:  Gilford Rile rolling DME Agency:  [place]:  NA [place] Agency:     Status of Service:  Completed, signed off  If discussed at [place] of Stay Meetings, dates discussed:    Discharge Disposition: home/self care   Additional Comments:  08/18/17- 0945- Marvetta Gibbons RN, CM- pt for d/c home today with wife- DME- RW has been placed- notified Jermaine with [name] for DME need- RW to be delivered to room prior to discharge  Dawayne Patricia, RN 08/18/2017, 9:55 AM

## 2017-08-18 NOTE — Progress Notes (Signed)
      [address]       [place] [address]             [phone]      6 Days Post-Op Procedure(s) (LRB): CORONARY ARTERY BYPASS GRAFTING (CABG) x six , using left internal mammory artery and right leg greater saphenous vein harvested endoscopically. (N/A) TRANSESOPHAGEAL ECHOCARDIOGRAM (TEE) (N/A)   Subjective:  No new complaints.  Ready to go home.   Objective: Vital signs in last 24 hours: Temp:  [98 F (36.7 C)-98.4 F (36.9 C)] 98 F (36.7 C) (04/21 0554) Pulse Rate:  [57-67] 58 (04/21 0554) Cardiac Rhythm: Sinus bradycardia (04/20 1900) Resp:  [14-25] 24 (04/21 0554) BP: (118-149)/(67-89) 138/76 (04/21 0554) SpO2:  [90 %-96 %] 95 % (04/21 0554) Weight:  [200 lb (90.7 kg)] 200 lb (90.7 kg) (04/21 0554)  Intake/Output from previous day: 04/20 0701 - 04/21 0700 In: 240 [P.O.:240] Out: -   General appearance: alert, cooperative and no distress Heart: regular rate and rhythm Lungs: clear to auscultation bilaterally Abdomen: soft, non-tender; bowel sounds normal; no masses,  no organomegaly Extremities: edema trace Wound: clean and dry  Lab Results: No results for input(s): WBC, HGB, HCT, PLT in the last 72 hours. BMET:  Recent Labs    08/16/17 0211  NA 134*  K 4.2  CL 102  CO2 24  GLUCOSE 111*  BUN 28*  CREATININE 1.18  CALCIUM 8.7*    PT/INR: No results for input(s): LABPROT, INR in the last 72 hours. ABG    Component Value Date/Time   PHART 7.346 (L) 08/12/2017 1921   HCO3 19.5 (L) 08/12/2017 1921   TCO2 23 08/13/2017 1721   ACIDBASEDEF 6.0 (H) 08/12/2017 1921   O2SAT 97.0 08/12/2017 1921   CBG (last 3)  No results for input(s): GLUCAP in the last 72 hours.  Assessment/Plan: S/P Procedure(s) (LRB): CORONARY ARTERY BYPASS GRAFTING (CABG) x six , using left internal mammory artery and right leg greater saphenous vein harvested endoscopically. (N/A) TRANSESOPHAGEAL ECHOCARDIOGRAM (TEE) (N/A)  1. CV- continues to maintain  NSR- continue Lopressor, Amiodarone 2. Pulm- no acute issues, continue IS 3. Renal- weight remains stable 4. Dispo- patient stable, maintaining NSR, no indication for anticoagulation at this time, will d/c home today   LOS: 9 days    Ellwood Handler 08/18/2017

## 2017-08-18 NOTE — Progress Notes (Signed)
Pt to be discharged. Wife at bedside. Sutures removed, steri strips and betadine over removal sites. Pt verbalizes understanding to leave in place until the steri strips fall off. Pt given discharge instructions and prescriptions. Pt and family verbalize understanding to follow up and with follow up appointments per d/c instructions. Pt denies further needs. NAD.

## 2017-08-19 ENCOUNTER — Other Ambulatory Visit: Payer: Self-pay

## 2017-08-19 MED ORDER — MESALAMINE 4 G RE ENEM: 4.0000 g | ENEMA | Freq: Every evening | RECTAL | 6 refills | Status: DC | PRN

## 2017-08-20 ENCOUNTER — Telehealth (HOSPITAL_COMMUNITY): Payer: Self-pay

## 2017-08-20 NOTE — Telephone Encounter (Signed)
Attempted to call patient to see if he is interested in the Cardiac Rehab Program - Lm on vm

## 2017-08-20 NOTE — Telephone Encounter (Signed)
Patients insurance is active and benefits verified through Medicare A/B- No co-pay, deductible amount of $185.00/$185.00 has been met, no out of pocket, 20% co-insurance, and no pre-authorization is required. Passport/reference [id number]  Patients insurance is active through [place].  Will contact patient to see if he is interested in the Cardiac Rehab Program. If interested, patient will need to complete follow up appt. Once completed, patient will be contacted for scheduling upon review by the RN Navigator.

## 2017-08-21 ENCOUNTER — Telehealth (HOSPITAL_COMMUNITY): Payer: Self-pay

## 2017-08-21 NOTE — Telephone Encounter (Signed)
Wife of patient called and left message on behalf of patient in regards to Cardiac Rehab. Attempted to return phone call - lm on vm

## 2017-08-21 NOTE — Telephone Encounter (Signed)
Patient returned phone call in regards to Cardiac Rehab - Patient stated he is very interested in the program. Explained scheduling process and patient verbalized understanding. Went over insurance and patient stated he understands. Will contact for scheduling after follow up appt has been completed.

## 2017-09-02 NOTE — Progress Notes (Signed)
Cardiology Office Note   Date:  09/04/2017   ID:  Stuart Gentry, DOB [birth year], MRN [id number]  PCP:  [name], MD  Cardiologist:  Dr. [name]    Chief Complaint  Patient presents with  . Hospitalization Follow-up    s/p CABG      History of Present Illness: Stuart Gentry is a 72 y.o. male who presents for post hospitalization,  D/c'd 08/16/17 for  CAD with CABG X 6.  Originally seen by Dr. [name] 07/29/17 for CT with coronary artery calcium, also DOE.  Also tightness in his chest.     Pt underwent myoview with ischemia in LAD territory.  ASA added and SL NTG added.  Set up for cath   With significant CAD mRCA 80% stenosis, mLCX 99% stenosis, ost 2nd Marg 99% stenosis, 1st marg. Ost. 90% stenosis, prox LAD 100% stenosis, 1st diag 90% stenosis, EF 50-55% . Pt was admitted and TCTS consult for surgery.    Echo with EF 55-60%, PA pk pressure 31 mmHg.   Carotid dopplers 1-39% stenosis bil.   On 08/12/17 CABG x 6 utilizing LIMA to LAD, Sequential SVG to Diagonal 1 and Diagonal 2, Sequential SVG to OM1 and OM3, and saphenous vein graft to PDA.  Did have post op a fib.  Po amiodarone and pt converted to SR.  He then [facility].   Today he is on amiodarone 200 mg BID, he is not aware of any rapid HR.  Originally HR was elevated at 91 but EKG done and HR 46.  BP was elevated but improved on recheck 118/80.  Pt has good appetite.  He is walking 30 min per day.  He has some chest tightness along incision and no SOB.      Past Medical History:  Diagnosis Date  . Chronic kidney disease    kidney stones?  . Coronary artery calcification seen on CAT scan   . GERD (gastroesophageal reflux disease)   . Hypercholesteremia   . Hypertension   . Hypothyroidism    no meds  . Sleep apnea    never been tested  . Ulcerative colitis     Past Surgical History:  Procedure Laterality Date  . COLONOSCOPY    . CORONARY ARTERY BYPASS GRAFT N/A 08/12/2017   Procedure: CORONARY  ARTERY BYPASS GRAFTING (CABG) x six , using left internal mammory artery and right leg greater saphenous vein harvested endoscopically.;  Surgeon: Gaye Pollack, MD;  Location: [facility] OR;  Service: Open Heart Surgery;  Laterality: N/A;  . LEFT HEART CATH AND CORONARY ANGIOGRAPHY N/A 08/09/2017   Procedure: LEFT HEART CATH AND CORONARY ANGIOGRAPHY;  Surgeon: Burnell Blanks, MD;  Location: [facility] CV LAB;  Service: Cardiovascular;  Laterality: N/A;  . SKIN TAG REMOVAL Right 2018   underarm  . TEE WITHOUT CARDIOVERSION N/A 08/12/2017   Procedure: TRANSESOPHAGEAL ECHOCARDIOGRAM (TEE);  Surgeon: Gaye Pollack, MD;  Location: [facility];  Service: Open Heart Surgery;  Laterality: N/A;     Current Outpatient Medications  Medication Sig Dispense Refill  . acetaminophen (TYLENOL) 325 MG tablet Take 2 tablets (650 mg total) by mouth every 6 (six) hours as needed for mild pain.    Marland Kitchen amiodarone (PACERONE) 200 MG tablet Take 200 mg by mouth 2 (two) times daily.  1  . aspirin EC 81 MG tablet Take 1 tablet (81 mg total) by mouth daily.    Marland Kitchen atorvastatin (LIPITOR) 80 MG tablet Take 1 tablet (80 mg  total) by mouth daily at 6 PM. 30 tablet 3  . esomeprazole (NEXIUM) 20 MG capsule Take 20 mg by mouth every morning.     . mesalamine (ROWASA) 4 g enema Place 60 mLs (4 g total) rectally at bedtime as needed. Place 60 mls rectally at bedtime 1800 mL 6  . Metoprolol Tartrate 37.5 MG TABS Take 37.5 mg by mouth 2 (two) times daily. 60 tablet 3  . Multiple Vitamin (MULTIVITAMIN) tablet Take 1 tablet by mouth daily.    . polyethylene glycol (MIRALAX / GLYCOLAX) packet Take 17 g by mouth daily.     Marland Kitchen sulfaSALAzine (AZULFIDINE) 500 MG tablet TAKE 4 TABLETS BY MOUTH 3  TIMES DAILY 1080 tablet 11  . traMADol (ULTRAM) 50 MG tablet Take 1 tablet (50 mg total) by mouth every 6 (six) hours as needed for moderate pain. 30 tablet 0   No current facility-administered medications for this visit.     Allergies:   Patient  has no known allergies.    Social History:  The patient  reports that he has never smoked. He has never used smokeless tobacco. He reports that he drinks alcohol. He reports that he does not use drugs.   Family History:  The patient's family history includes Heart disease in his brother; Kidney disease in his brother; Liver cancer in his mother; Lymphoma in his brother.    ROS:  General:no colds or fevers, + weight decrease.   Skin:no rashes or ulcers HEENT:no blurred vision, no congestion CV:see HPI PUL:see HPI GI:no diarrhea constipation or melena, no indigestion GU:no hematuria, no dysuria MS:no joint pain, no claudication Neuro:no syncope, no lightheadedness Endo:no diabetes, no thyroid disease  Wt Readings from Last 3 Encounters:  09/04/17 197 lb (89.4 kg)  08/18/17 200 lb (90.7 kg)  08/05/17 207 lb (93.9 kg)     PHYSICAL EXAM: VS:  BP 140/70   Pulse 91   Ht 5\' 11"  (1.803 m)   Wt 197 lb (89.4 kg)   BMI 27.48 kg/m  , BMI Body mass index is 27.48 kg/m. General:Pleasant affect, NAD Skin:Warm and dry, brisk capillary refill HEENT:normocephalic, sclera clear, mucus membranes moist Neck:supple, no JVD, no bruits  Heart:S1S2 RRR without murmur, gallup, rub or click, chest wall incision healing well without erythemia  Lungs:clear without rales, rhonchi, or wheezes URK:YHCW, non tender, + BS, do not palpate liver spleen or masses Ext:no lower ext edema, 2+ pedal pulses, 2+ radial pulses Neuro:alert and oriented X 3, MAE, follows commands, + facial symmetry    EKG:  EKG is ordered today. 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 Cardiac cath  Significant Diagnostic Studies: angiography:    Prox RCA to Mid RCA lesion is 50% stenosed.  Mid RCA-1 lesion is 80% stenosed.  Mid RCA-2 lesion is 60% stenosed.  Mid RCA to Dist RCA lesion is 20% stenosed.  Mid Cx lesion is 99% stenosed.  Ost 2nd  Mrg lesion is 99% stenosed.  Ost 1st Mrg lesion is 90% stenosed.  Ost Cx lesion is 50% stenosed.  Prox Cx lesion is 50% stenosed.  Prox LAD lesion is 100% stenosed.  Ost LAD to Prox LAD lesion is 20% stenosed.  Ost 1st Diag lesion is 90% stenosed.  The left ventricular systolic function is normal.  LV end diastolic pressure is normal.  The left ventricular ejection fraction is 50-55% by visual estimate.  There is no mitral valve regurgitation.  1. Severe triple vessel CAD with CTO of the mid LAD. The mid LAD fills from right to left collaterals.  2. High grade stenosis in the mid segment of the large, dominant RCA 3. High grade stenosis in the mid Circumflex, OM1 and OM2.  4. High grade stenosis large Diagonal branch.  5. Preserved LV systolic function  CABG 01/21/25  1. Median Sternotomy 2. Extracorporeal circulation 3. Coronary artery bypass grafting x 6   Left internal mammary graft to the LAD  Sequential SVG to Diagonal 1 and Diagonal 2  Sequential SVG to OM1 and OM3  SVG to PDA 4. Endoscopic vein harvest from the rightleg     ASSESSMENT AND PLAN:  1.  CAD with + nuc study and significant CAD on cath --resulting in CABG.  Pt doing well today, walking 30 min per day.    2.  S/P CABG X 6 Left internal mammary graft to the LAD; Sequential SVG to Diagonal 1 and Diagonal 2;  SVG to PDA  3.  Post op a fib, no recurrence that pt is aware.  On amiodarone and BB  4.  Sinus brady at 46, RBBB and LAFB, will  decrease amiodarone to 200 mg daily.  Dr. Cyndia Bent may stop on his visit.  Continue BB for now.  5.  HTN elevated on arrival but decreased at discharge.  Monitor.    6.  HLD will need lipid checked in 8-10 weeks with Dr. [name] on follow up on statin 80 mg.    7.   Medication eval , will check hepatic and TSH on amiodarone  8.  Post op anemia will recheck CBC  9.  Hyponatremia will check BMP             3.   PAF post op  4.   HLD   5.    HTN   Current medicines are reviewed with the patient today.  The patient Has no concerns regarding medicines.  The following changes have been made:  See above Labs/ tests ordered today include:see above  Disposition:   FU:  see above  Signed, Cecilie Kicks, NP  09/04/2017 11:07 AM    [address], [place], [place], [place] Phone: [phone]; Fax: [phone]

## 2017-09-04 ENCOUNTER — Ambulatory Visit (INDEPENDENT_AMBULATORY_CARE_PROVIDER_SITE_OTHER): Payer: Medicare Other | Admitting: Cardiology

## 2017-09-04 ENCOUNTER — Encounter: Payer: Self-pay | Admitting: Cardiology

## 2017-09-04 VITALS — BP 140/70 | HR 91 | Ht 71.0 in | Wt 197.0 lb

## 2017-09-04 DIAGNOSIS — Z79899 Other long term (current) drug therapy: Secondary | ICD-10-CM | POA: Diagnosis not present

## 2017-09-04 DIAGNOSIS — I480 Paroxysmal atrial fibrillation: Secondary | ICD-10-CM

## 2017-09-04 DIAGNOSIS — I2510 Atherosclerotic heart disease of native coronary artery without angina pectoris: Secondary | ICD-10-CM | POA: Diagnosis not present

## 2017-09-04 DIAGNOSIS — E871 Hypo-osmolality and hyponatremia: Secondary | ICD-10-CM

## 2017-09-04 DIAGNOSIS — Z951 Presence of aortocoronary bypass graft: Secondary | ICD-10-CM | POA: Diagnosis not present

## 2017-09-04 DIAGNOSIS — D500 Iron deficiency anemia secondary to blood loss (chronic): Secondary | ICD-10-CM | POA: Diagnosis not present

## 2017-09-04 DIAGNOSIS — E785 Hyperlipidemia, unspecified: Secondary | ICD-10-CM | POA: Diagnosis not present

## 2017-09-04 DIAGNOSIS — I10 Essential (primary) hypertension: Secondary | ICD-10-CM

## 2017-09-04 DIAGNOSIS — R001 Bradycardia, unspecified: Secondary | ICD-10-CM | POA: Diagnosis not present

## 2017-09-04 LAB — COMPREHENSIVE METABOLIC PANEL
A/G RATIO: 1.4 (ref 1.2–2.2)
ALBUMIN: 3.9 g/dL (ref 3.5–4.8)
ALT: 27 IU/L (ref 0–44)
AST: 22 IU/L (ref 0–40)
Alkaline Phosphatase: 106 IU/L (ref 39–117)
BUN / CREAT RATIO: 13 (ref 10–24)
BUN: 15 mg/dL (ref 8–27)
Bilirubin Total: 0.2 mg/dL (ref 0.0–1.2)
CALCIUM: 9.8 mg/dL (ref 8.6–10.2)
CO2: 23 mmol/L (ref 20–29)
CREATININE: 1.2 mg/dL (ref 0.76–1.27)
Chloride: 105 mmol/L (ref 96–106)
GFR calc Af Amer: 69 mL/min/{1.73_m2} (ref >59)
GFR, EST NON AFRICAN AMERICAN: 60 mL/min/{1.73_m2} (ref >59)
Globulin, Total: 2.8 g/dL (ref 1.5–4.5)
Glucose: 104 mg/dL — ABNORMAL HIGH (ref 65–99)
POTASSIUM: 4.7 mmol/L (ref 3.5–5.2)
SODIUM: 141 mmol/L (ref 134–144)
Total Protein: 6.7 g/dL (ref 6.0–8.5)

## 2017-09-04 LAB — CBC
HEMATOCRIT: 35.5 % — AB (ref 37.5–51.0)
Hemoglobin: 12 g/dL — ABNORMAL LOW (ref 13.0–17.7)
MCH: 31.7 pg (ref 26.6–33.0)
MCHC: 33.8 g/dL (ref 31.5–35.7)
MCV: 94 fL (ref 79–97)
PLATELETS: 297 10*3/uL (ref 150–379)
RBC: 3.78 x10E6/uL — ABNORMAL LOW (ref 4.14–5.80)
RDW: 14.2 % (ref 12.3–15.4)
WBC: 5.4 10*3/uL (ref 3.4–10.8)

## 2017-09-04 LAB — TSH: TSH: 11.64 u[IU]/mL — ABNORMAL HIGH (ref 0.450–4.500)

## 2017-09-04 MED ORDER — AMIODARONE HCL 200 MG PO TABS: 200.0000 mg | ORAL_TABLET | Freq: Every day | ORAL | 6 refills | Status: DC

## 2017-09-04 NOTE — Patient Instructions (Addendum)
Medication Instructions:    START TAKING AMIODARONE 200 MG ONCE A DAY     If you need a refill on your cardiac medications before your next appointment, please call your pharmacy.  Labwork:  CBC CMP AND TSH  TODAY    Testing/Procedures:  NONE ORDERED  TODAY   Follow-Up:  [name] 3 TO 4 MONTHS    Any Other Special Instructions Will Be Listed Below (If Applicable).

## 2017-09-06 ENCOUNTER — Telehealth: Payer: Self-pay | Admitting: Cardiology

## 2017-09-06 ENCOUNTER — Other Ambulatory Visit: Payer: Self-pay | Admitting: Family Medicine

## 2017-09-06 ENCOUNTER — Telehealth: Payer: Self-pay

## 2017-09-06 MED ORDER — LEVOTHYROXINE SODIUM 25 MCG PO TABS: 25.0000 ug | ORAL_TABLET | Freq: Every day | ORAL | 5 refills | Status: DC

## 2017-09-06 NOTE — Telephone Encounter (Signed)
[name] pt.  Will route to Dr. Kyla Balzarine nurse.

## 2017-09-06 NOTE — Telephone Encounter (Signed)
-----   Message from Isaiah Serge, NP sent at 09/05/2017 12:34 PM EDT ----- See Dr. Samella Parr note about thyroid.  Otherwise labs are good. Hgb back to normal also electrolytes.

## 2017-09-06 NOTE — Telephone Encounter (Signed)
Pt is aware and agreeable to lab results. Per Dr [name] patient needs to start Levothyroxine 25 mcg QD. Per patient request Levothyroxine will be sent to [facility] and he would like 6 week recheck to be done at PCP. I will route to arrange.

## 2017-09-06 NOTE — Telephone Encounter (Signed)
Informed patient that his PCP will need to follow up with lab work -TSH and should be filling his synthroid. Patient verbalized understanding and will call his PCP for further questions.

## 2017-09-06 NOTE — Telephone Encounter (Signed)
New Message  Pt states he would like to have his labs done at or office instead of his local provider. No orders in epic. Please call

## 2017-09-09 ENCOUNTER — Other Ambulatory Visit: Payer: Self-pay | Admitting: Family Medicine

## 2017-09-09 ENCOUNTER — Encounter (INDEPENDENT_AMBULATORY_CARE_PROVIDER_SITE_OTHER): Payer: Self-pay

## 2017-09-09 DIAGNOSIS — E039 Hypothyroidism, unspecified: Secondary | ICD-10-CM

## 2017-09-09 DIAGNOSIS — E038 Other specified hypothyroidism: Secondary | ICD-10-CM

## 2017-09-10 ENCOUNTER — Telehealth (HOSPITAL_COMMUNITY): Payer: Self-pay

## 2017-09-10 NOTE — Telephone Encounter (Signed)
Attempted to call patient to schedule Cardiac Rehab - lm on vm

## 2017-09-12 ENCOUNTER — Telehealth (HOSPITAL_COMMUNITY): Payer: Self-pay

## 2017-09-12 NOTE — Telephone Encounter (Signed)
Patient returned phone in regards to Cardiac Rehab - Patient stated he wants to get started before July. Patient lives in [facility] and stated he will participate at [facility]. Sent referral.

## 2017-09-17 ENCOUNTER — Encounter: Payer: Self-pay | Admitting: Family Medicine

## 2017-09-17 ENCOUNTER — Other Ambulatory Visit: Payer: Self-pay | Admitting: Surgery

## 2017-09-17 DIAGNOSIS — Z951 Presence of aortocoronary bypass graft: Secondary | ICD-10-CM

## 2017-09-18 ENCOUNTER — Encounter: Payer: Self-pay | Admitting: Surgery

## 2017-09-18 ENCOUNTER — Other Ambulatory Visit: Payer: Self-pay

## 2017-09-18 ENCOUNTER — Ambulatory Visit (INDEPENDENT_AMBULATORY_CARE_PROVIDER_SITE_OTHER): Payer: Self-pay | Admitting: Surgery

## 2017-09-18 ENCOUNTER — Ambulatory Visit
Admission: RE | Admit: 2017-09-18 | Discharge: 2017-09-18 | Disposition: A | Discharge status: Home / Self Care | Payer: Medicare Other | Source: Ambulatory Visit | Attending: Surgery | Admitting: Surgery

## 2017-09-18 VITALS — BP 140/80 | HR 52 | Resp 16 | Ht 71.0 in | Wt 195.4 lb

## 2017-09-18 DIAGNOSIS — Z951 Presence of aortocoronary bypass graft: Secondary | ICD-10-CM

## 2017-09-18 DIAGNOSIS — I2510 Atherosclerotic heart disease of native coronary artery without angina pectoris: Secondary | ICD-10-CM

## 2017-09-18 NOTE — Progress Notes (Signed)
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 The patient's early postoperative recovery while in the hospital was notable for development of postoperative atrial fibrillation that was converted with amiodarone. Since hospital discharge the patient reports that he has been feeling well.  He is walking 40 minutes/day without chest pain or shortness of breath.  He has noted some mild burning discomfort in the distribution of the left internal mammary artery harvest.   Current Outpatient Medications  Medication Sig Dispense Refill  . acetaminophen (TYLENOL) 325 MG tablet Take 2 tablets (650 mg total) by mouth every 6 (six) hours as needed for mild pain.    Marland Kitchen amiodarone (PACERONE) 200 MG tablet Take 1 tablet (200 mg total) by mouth daily. 30 tablet 6  . aspirin EC 81 MG tablet Take 1 tablet (81 mg total) by mouth daily.    Marland Kitchen atorvastatin (LIPITOR) 80 MG tablet Take 1 tablet (80 mg total) by mouth daily at 6 PM. 30 tablet 3  . esomeprazole (NEXIUM) 20 MG capsule Take 20 mg by mouth every morning.     Marland Kitchen levothyroxine (SYNTHROID) 25 MCG tablet Take 1 tablet (25 mcg total) by mouth daily before breakfast. 30 tablet 5  . mesalamine (ROWASA) 4 g enema Place 60 mLs (4 g total) rectally at bedtime as needed. Place 60 mls rectally at bedtime 1800 mL 6  . Metoprolol Tartrate 37.5 MG TABS Take 37.5 mg by mouth 2 (two) times daily. 60 tablet 3  . Multiple Vitamin (MULTIVITAMIN) tablet Take 1 tablet by mouth daily.    . polyethylene glycol (MIRALAX / GLYCOLAX) packet Take 17 g by mouth daily.     Marland Kitchen sulfaSALAzine (AZULFIDINE) 500 MG tablet TAKE 4 TABLETS BY MOUTH 3  TIMES DAILY 1080 tablet 11   No current facility-administered medications for this visit.     Physical Exam: BP 140/80 (BP Location: Right Arm, Patient Position: Sitting, Cuff Size: Normal)   Pulse (!) 52   Resp 16   Ht 5' 11"  (1.803 m)   Wt 195 lb 6.4  oz (88.6 kg)   SpO2 97% Comment: ON RA  BMI 27.25 kg/m  He looks well. Cardiac exam shows a regular rate and rhythm with normal heart sounds. Lungs are clear. Chest incision is healing well and the sternum is stable. The right leg incisions are healing well and there is no lower extremity edema.  Diagnostic Tests:  CLINICAL DATA:  Status post 6 vessel CABG  EXAM: CHEST - 2 VIEW  COMPARISON:  08/14/2017 chest radiograph.  FINDINGS: Intact sternotomy wires. CABG clips overlie the mediastinum. Stable cardiomediastinal silhouette with normal heart size. No pneumothorax. No pleural effusion. Lungs appear clear, with no acute consolidative airspace disease and no pulmonary edema.  IMPRESSION: No active cardiopulmonary disease.   Electronically Signed   By: Ilona Sorrel M.D.   On: 09/18/2017 09:35   Impression:  Overall I think he is doing well. I encouraged him to continue walking. He is planning to participate in cardiac rehab and is going for orientation tomorrow. I told him he could drive his car but should not lift anything heavier than 10 lbs for three months postop.  He appears to be maintaining sinus rhythm and I will discontinue the amiodarone at this time.  He will continue his metoprolol at the current dose.   Plan:  He will continue to follow-up with cardiology and Dr. [name] and will return to  see me if he has any problems with his incisions.   Gaye Pollack, MD [facility] [phone]

## 2017-09-19 ENCOUNTER — Encounter: Payer: Medicare Other | Attending: Cardiovascular Disease | Admitting: *Deleted

## 2017-09-19 VITALS — Ht 71.0 in | Wt 200.7 lb

## 2017-09-19 DIAGNOSIS — Z951 Presence of aortocoronary bypass graft: Secondary | ICD-10-CM

## 2017-09-19 NOTE — Progress Notes (Signed)
Cardiac Individual Treatment Plan  Patient Details  Name: Stuart Gentry MRN: [id number] Date of Birth: [birth year] Referring Provider:     Cardiac Rehab from 09/19/2017 in [facility] Cardiac and Pulmonary Rehab  Referring Provider  [name]      Initial Encounter Date:    Cardiac Rehab from 09/19/2017 in [facility] Cardiac and Pulmonary Rehab  Date  09/19/17  Referring Provider  [name]      Visit Diagnosis: S/P CABG x 6  Patient's Home Medications on Admission:  Current Outpatient Medications:  .  acetaminophen (TYLENOL) 325 MG tablet, Take 2 tablets (650 mg total) by mouth every 6 (six) hours as needed for mild pain., Disp: , Rfl:  .  aspirin EC 81 MG tablet, Take 1 tablet (81 mg total) by mouth daily., Disp: , Rfl:  .  atorvastatin (LIPITOR) 80 MG tablet, Take 1 tablet (80 mg total) by mouth daily at 6 PM., Disp: 30 tablet, Rfl: 3 .  esomeprazole (NEXIUM) 20 MG capsule, Take 20 mg by mouth every morning. , Disp: , Rfl:  .  levothyroxine (SYNTHROID) 25 MCG tablet, Take 1 tablet (25 mcg total) by mouth daily before breakfast., Disp: 30 tablet, Rfl: 5 .  mesalamine (ROWASA) 4 g enema, Place 60 mLs (4 g total) rectally at bedtime as needed. Place 60 mls rectally at bedtime, Disp: 1800 mL, Rfl: 6 .  Metoprolol Tartrate 37.5 MG TABS, Take 37.5 mg by mouth 2 (two) times daily., Disp: 60 tablet, Rfl: 3 .  Multiple Vitamin (MULTIVITAMIN) tablet, Take 1 tablet by mouth daily., Disp: , Rfl:  .  polyethylene glycol (MIRALAX / GLYCOLAX) packet, Take 17 g by mouth daily. , Disp: , Rfl:  .  sulfaSALAzine (AZULFIDINE) 500 MG tablet, TAKE 4 TABLETS BY MOUTH 3  TIMES DAILY, Disp: 1080 tablet, Rfl: 11 .  amiodarone (PACERONE) 200 MG tablet, Take 1 tablet (200 mg total) by mouth daily. (Patient not taking: Reported on 09/19/2017), Disp: 30 tablet, Rfl: 6  Past Medical History: Past Medical History:  Diagnosis Date  . Chronic kidney disease    kidney stones?  . Coronary artery calcification seen on CAT scan    . GERD (gastroesophageal reflux disease)   . Hypercholesteremia   . Hypertension   . Hypothyroidism    no meds  . Sleep apnea    never been tested  . Ulcerative colitis     Tobacco Use: Social History   Tobacco Use  Smoking Status Never Smoker  Smokeless Tobacco Never Used    Labs: Recent Review Flowsheet Data    Labs for ITP Cardiac and Pulmonary Rehab Latest Ref Rng & Units 08/12/2017 08/12/2017 08/12/2017 08/12/2017 08/13/2017   Cholestrol <200 mg/dL - - - - -   LDLCALC mg/dL (calc) - - - - -   HDL >40 mg/dL - - - - -   Trlycerides <150 mg/dL - - - - -   Hemoglobin A1c 4.8 - 5.6 % - - - - -   PHART 7.350 - 7.450 7.333(L) 7.382 7.346(L) - -   PCO2ART 32.0 - 48.0 mmHg 42.2 31.8(L) 35.7 - -   HCO3 20.0 - 28.0 mmol/L 22.7 19.0(L) 19.5(L) - -   TCO2 22 - 32 mmol/L 24 20(L) 21(L) 19(L) 23   ACIDBASEDEF 0.0 - 2.0 mmol/L 3.0(H) 5.0(H) 6.0(H) - -   O2SAT % 95.0 92.0 97.0 - -       Exercise Target Goals: Date: 09/19/17  Exercise Program Goal: Individual exercise prescription set using results from initial  6 min walk test and THRR while considering  patient's activity barriers and safety.   Exercise Prescription Goal: Initial exercise prescription builds to 30-45 minutes a day of aerobic activity, 2-3 days per week.  Home exercise guidelines will be given to patient during program as part of exercise prescription that the participant will acknowledge.  Activity Barriers & Risk Stratification: Activity Barriers & Cardiac Risk Stratification - 09/19/17 1419      Activity Barriers & Cardiac Risk Stratification   Activity Barriers  None    Cardiac Risk Stratification  Moderate       6 Minute Walk: 6 Minute Walk    Row Name 09/19/17 1408         6 Minute Walk   Distance  1320 feet     Walk Time  6 minutes     # of Rest Breaks  0     MPH  2.5     METS  3.18     RPE  12     Perceived Dyspnea   0     VO2 Peak  11.14     Symptoms  No     Resting HR  51 bpm      Resting BP  136/56     Resting Oxygen Saturation   99 %     Exercise Oxygen Saturation  during 6 min walk  96 %     Max Ex. HR  106 bpm     Max Ex. BP  158/68     2 Minute Post BP  124/74        Oxygen Initial Assessment:   Oxygen Re-Evaluation:   Oxygen Discharge (Final Oxygen Re-Evaluation):   Initial Exercise Prescription: Initial Exercise Prescription - 09/19/17 1400      Date of Initial Exercise RX and Referring Provider   Date  09/19/17    Referring Provider  [name]      Treadmill   MPH  2.5    Grade  0.5    Minutes  15    METs  3      Recumbant Bike   Level  3    RPM  60    Watts  30    Minutes  15    METs  3      NuStep   Level  3    SPM  80    Minutes  15    METs  3      Prescription Details   Frequency (times per week)  3    Duration  Progress to 45 minutes of aerobic exercise without signs/symptoms of physical distress      Intensity   THRR 40-80% of Max Heartrate  90-129    Ratings of Perceived Exertion  11-13    Perceived Dyspnea  0-4      Resistance Training   Training Prescription  Yes    Weight  3 lb    Reps  10-15       Perform Capillary Blood Glucose checks as needed.  Exercise Prescription Changes:   Exercise Comments:   Exercise Goals and Review: Exercise Goals    Row Name 09/19/17 1407             Exercise Goals   Increase Physical Activity  Yes       Intervention  Provide advice, education, support and counseling about physical activity/exercise needs.;Develop an individualized exercise prescription for aerobic and resistive training based on initial evaluation findings, risk stratification,  comorbidities and participant's personal goals.       Expected Outcomes  Short Term: Attend rehab on a regular basis to increase amount of physical activity.;Long Term: Add in home exercise to make exercise part of routine and to increase amount of physical activity.;Long Term: Exercising regularly at least 3-5 days a week.        Increase Strength and Stamina  Yes       Intervention  Provide advice, education, support and counseling about physical activity/exercise needs.;Develop an individualized exercise prescription for aerobic and resistive training based on initial evaluation findings, risk stratification, comorbidities and participant's personal goals.       Expected Outcomes  Short Term: Increase workloads from initial exercise prescription for resistance, speed, and METs.;Short Term: Perform resistance training exercises routinely during rehab and add in resistance training at home;Long Term: Improve cardiorespiratory fitness, muscular endurance and strength as measured by increased METs and functional capacity (6MWT)       Able to understand and use rate of perceived exertion (RPE) scale  Yes       Intervention  Provide education and explanation on how to use RPE scale       Expected Outcomes  Short Term: Able to use RPE daily in rehab to express subjective intensity level;Long Term:  Able to use RPE to guide intensity level when exercising independently       Able to understand and use Dyspnea scale  Yes       Intervention  Provide education and explanation on how to use Dyspnea scale       Expected Outcomes  Short Term: Able to use Dyspnea scale daily in rehab to express subjective sense of shortness of breath during exertion;Long Term: Able to use Dyspnea scale to guide intensity level when exercising independently       Knowledge and understanding of Target Heart Rate Range (THRR)  Yes       Intervention  Provide education and explanation of THRR including how the numbers were predicted and where they are located for reference       Expected Outcomes  Short Term: Able to state/look up THRR;Short Term: Able to use daily as guideline for intensity in rehab;Long Term: Able to use THRR to govern intensity when exercising independently       Able to check pulse independently  Yes       Intervention  Provide education  and demonstration on how to check pulse in carotid and radial arteries.;Review the importance of being able to check your own pulse for safety during independent exercise       Expected Outcomes  Short Term: Able to explain why pulse checking is important during independent exercise;Long Term: Able to check pulse independently and accurately       Understanding of Exercise Prescription  Yes       Intervention  Provide education, explanation, and written materials on patient's individual exercise prescription       Expected Outcomes  Short Term: Able to explain program exercise prescription;Long Term: Able to explain home exercise prescription to exercise independently          Exercise Goals Re-Evaluation :   Discharge Exercise Prescription (Final Exercise Prescription Changes):   Nutrition:  Target Goals: Understanding of nutrition guidelines, daily intake of sodium <1534m, cholesterol <2062m calories 30% from fat and 7% or less from saturated fats, daily to have 5 or more servings of fruits and vegetables.  Biometrics: Pre Biometrics - 09/19/17 1406  Pre Biometrics   Height  5' 11"  (1.803 m)    Weight  200 lb 11.2 oz (91 kg)    Waist Circumference  41 inches    Hip Circumference  42 inches    Waist to Hip Ratio  0.98 %    BMI (Calculated)  28    Single Leg Stand  8.07 seconds        Nutrition Therapy Plan and Nutrition Goals: Nutrition Therapy & Goals - 09/19/17 1145      Intervention Plan   Intervention  Prescribe, educate and counsel regarding individualized specific dietary modifications aiming towards targeted core components such as weight, hypertension, lipid management, diabetes, heart failure and other comorbidities.    Expected Outcomes  Short Term Goal: Understand basic principles of dietary content, such as calories, fat, sodium, cholesterol and nutrients.;Short Term Goal: A plan has been developed with personal nutrition goals set during dietitian  appointment.;Long Term Goal: Adherence to prescribed nutrition plan.       Nutrition Assessments: Nutrition Assessments - 09/19/17 1145      MEDFICTS Scores   Pre Score  38       Nutrition Goals Re-Evaluation:   Nutrition Goals Discharge (Final Nutrition Goals Re-Evaluation):   Psychosocial: Target Goals: Acknowledge presence or absence of significant depression and/or stress, maximize coping skills, provide positive support system. Participant is able to verbalize types and ability to use techniques and skills needed for reducing stress and depression.   Initial Review & Psychosocial Screening: Initial Psych Review & Screening - 09/19/17 1416      Initial Review   Current issues with  Current Stress Concerns    Source of Stress Concerns  Unable to perform yard/household activities;Unable to participate in former interests or hobbies      [place]?  Yes wife, son, brothers (out of state)       Barriers   Psychosocial barriers to participate in program  There are no identifiable barriers or psychosocial needs.;The patient should benefit from training in stress management and relaxation.      Screening Interventions   Interventions  Program counselor consult;Encouraged to exercise;Provide feedback about the scores to participant;To provide support and resources with identified psychosocial needs    Expected Outcomes  Short Term goal: Utilizing psychosocial counselor, staff and physician to assist with identification of specific Stressors or current issues interfering with healing process. Setting desired goal for each stressor or current issue identified.;Long Term Goal: Stressors or current issues are controlled or eliminated.;Short Term goal: Identification and review with participant of any Quality of Life or Depression concerns found by scoring the questionnaire.;Long Term goal: The participant improves quality of Life and PHQ9 Scores as seen by post  scores and/or verbalization of changes       Quality of Life Scores:  Quality of Life - 09/19/17 1146      Quality of Life Scores   Health/Function Pre  0.23 %    Socioeconomic Pre  28.33 %    Psych/Spiritual Pre  27.33 %    Family Pre  28.8 %    GLOBAL Pre  27.73 %      Scores of 19 and below usually indicate a poorer quality of life in these areas.  A difference of  2-3 points is a clinically meaningful difference.  A difference of 2-3 points in the total score of the Quality of Life Index has been associated with significant improvement in overall quality of life, self-image,  physical symptoms, and general health in studies assessing change in quality of life.  PHQ-9: Recent Review Flowsheet Data    Depression screen [facility] 2/9 09/19/2017 06/14/2017 06/14/2017 10/05/2013   Decreased Interest 0 0 0 0   Down, Depressed, Hopeless 0 0 0 0   PHQ - 2 Score 0 0 0 0   Altered sleeping 0 - - -   Tired, decreased energy 1 - - -   Change in appetite 0 - - -   Feeling bad or failure about yourself  1 - - -   Trouble concentrating 0 - - -   Moving slowly or fidgety/restless 0 - - -   Suicidal thoughts 0 - - -   PHQ-9 Score 2 - - -   Difficult doing work/chores Not difficult at all - - -     Interpretation of Total Score  Total Score Depression Severity:  1-4 = Minimal depression, 5-9 = Mild depression, 10-14 = Moderate depression, 15-19 = Moderately severe depression, 20-27 = Severe depression   Psychosocial Evaluation and Intervention:   Psychosocial Re-Evaluation:   Psychosocial Discharge (Final Psychosocial Re-Evaluation):   Vocational Rehabilitation: Provide vocational rehab assistance to qualifying candidates.   Vocational Rehab Evaluation & Intervention: Vocational Rehab - 09/19/17 1149      Initial Vocational Rehab Evaluation & Intervention   Assessment shows need for Vocational Rehabilitation  No       Education: Education Goals: Education classes will be provided on  a variety of topics geared toward better understanding of heart health and risk factor modification. Participant will state understanding/return demonstration of topics presented as noted by education test scores.  Learning Barriers/Preferences: Learning Barriers/Preferences - 09/19/17 1417      Learning Barriers/Preferences   Learning Barriers  None    Learning Preferences  None       Education Topics:  AED/CPR: - Group verbal and written instruction with the use of models to demonstrate the basic use of the AED with the basic ABC's of resuscitation.   General Nutrition Guidelines/Fats and Fiber: -Group instruction provided by verbal, written material, models and posters to present the general guidelines for heart healthy nutrition. Gives an explanation and review of dietary fats and fiber.   Controlling Sodium/Reading Food Labels: -Group verbal and written material supporting the discussion of sodium use in heart healthy nutrition. Review and explanation with models, verbal and written materials for utilization of the food label.   Exercise Physiology & General Exercise Guidelines: - Group verbal and written instruction with models to review the exercise physiology of the cardiovascular system and associated critical values. Provides general exercise guidelines with specific guidelines to those with heart or lung disease.    Aerobic Exercise & Resistance Training: - Gives group verbal and written instruction on the various components of exercise. Focuses on aerobic and resistive training programs and the benefits of this training and how to safely progress through these programs..   Flexibility, Balance, Mind/Body Relaxation: Provides group verbal/written instruction on the benefits of flexibility and balance training, including mind/body exercise modes such as yoga, pilates and tai chi.  Demonstration and skill practice provided.   Stress and Anxiety: - Provides group verbal  and written instruction about the health risks of elevated stress and causes of high stress.  Discuss the correlation between heart/lung disease and anxiety and treatment options. Review healthy ways to manage with stress and anxiety.   Depression: - Provides group verbal and written instruction on the correlation between heart/lung disease  and depressed mood, treatment options, and the stigmas associated with seeking treatment.   Anatomy & Physiology of the Heart: - Group verbal and written instruction and models provide basic cardiac anatomy and physiology, with the coronary electrical and arterial systems. Review of Valvular disease and Heart Failure   Cardiac Procedures: - Group verbal and written instruction to review commonly prescribed medications for heart disease. Reviews the medication, class of the drug, and side effects. Includes the steps to properly store meds and maintain the prescription regimen. (beta blockers and nitrates)   Cardiac Medications I: - Group verbal and written instruction to review commonly prescribed medications for heart disease. Reviews the medication, class of the drug, and side effects. Includes the steps to properly store meds and maintain the prescription regimen.   Cardiac Medications II: -Group verbal and written instruction to review commonly prescribed medications for heart disease. Reviews the medication, class of the drug, and side effects. (all other drug classes)    Go Sex-Intimacy & Heart Disease, Get SMART - Goal Setting: - Group verbal and written instruction through game format to discuss heart disease and the return to sexual intimacy. Provides group verbal and written material to discuss and apply goal setting through the application of the S.M.A.R.T. Method.   Other Matters of the Heart: - Provides group verbal, written materials and models to describe Stable Angina and Peripheral Artery. Includes description of the disease process  and treatment options available to the cardiac patient.   Exercise & Equipment Safety: - Individual verbal instruction and demonstration of equipment use and safety with use of the equipment.   Cardiac Rehab from 09/19/2017 in [facility] Cardiac and Pulmonary Rehab  Date  09/19/17  Educator  [facility]  Instruction Review Code  1- Verbalizes Understanding      Infection Prevention: - Provides verbal and written material to individual with discussion of infection control including proper hand washing and proper equipment cleaning during exercise session.   Cardiac Rehab from 09/19/2017 in [facility] Cardiac and Pulmonary Rehab  Date  09/19/17  Educator  [facility]  Instruction Review Code  1- Verbalizes Understanding      Falls Prevention: - Provides verbal and written material to individual with discussion of falls prevention and safety.   Cardiac Rehab from 09/19/2017 in [facility] Cardiac and Pulmonary Rehab  Date  09/19/17  Educator  [facility]  Instruction Review Code  1- Verbalizes Understanding      Diabetes: - Individual verbal and written instruction to review signs/symptoms of diabetes, desired ranges of glucose level fasting, after meals and with exercise. Acknowledge that pre and post exercise glucose checks will be done for 3 sessions at entry of program.   Know Your Numbers and Risk Factors: -Group verbal and written instruction about important numbers in your health.  Discussion of what are risk factors and how they play a role in the disease process.  Review of Cholesterol, Blood Pressure, Diabetes, and BMI and the role they play in your overall health.   Sleep Hygiene: -Provides group verbal and written instruction about how sleep can affect your health.  Define sleep hygiene, discuss sleep cycles and impact of sleep habits. Review good sleep hygiene tips.    Other: -Provides group and verbal instruction on various topics (see comments)   Knowledge Questionnaire Score: Knowledge Questionnaire Score  - 09/19/17 1146      Knowledge Questionnaire Score   Pre Score  19/26        Angina, Depresion, Exercise and Nutrition areas for  further education       Core Components/Risk Factors/Patient Goals at Admission: Personal Goals and Risk Factors at Admission - 09/19/17 1415      Core Components/Risk Factors/Patient Goals on Admission    Weight Management  Yes;Weight Maintenance    Intervention  Weight Management: Develop a combined nutrition and exercise program designed to reach desired caloric intake, while maintaining appropriate intake of nutrient and fiber, sodium and fats, and appropriate energy expenditure required for the weight goal.;Weight Management: Provide education and appropriate resources to help participant work on and attain dietary goals.    Admit Weight  200 lb (90.7 kg)    Expected Outcomes  Short Term: Continue to assess and modify interventions until short term weight is achieved;Long Term: Adherence to nutrition and physical activity/exercise program aimed toward attainment of established weight goal;Weight Maintenance: Understanding of the daily nutrition guidelines, which includes 25-35% calories from fat, 7% or less cal from saturated fats, less than 236m cholesterol, less than 1.5gm of sodium, & 5 or more servings of fruits and vegetables daily;Understanding recommendations for meals to include 15-35% energy as protein, 25-35% energy from fat, 35-60% energy from carbohydrates, less than 2028mof dietary cholesterol, 20-35 gm of total fiber daily;Understanding of distribution of calorie intake throughout the day with the consumption of 4-5 meals/snacks    Hypertension  Yes    Intervention  Provide education on lifestyle modifcations including regular physical activity/exercise, weight management, moderate sodium restriction and increased consumption of fresh fruit, vegetables, and low fat dairy, alcohol moderation, and smoking cessation.;Monitor prescription use compliance.     Expected Outcomes  Short Term: Continued assessment and intervention until BP is < 140/9033mG in hypertensive participants. < 130/47m59m in hypertensive participants with diabetes, heart failure or chronic kidney disease.;Long Term: Maintenance of blood pressure at goal levels.    Lipids  Yes    Intervention  Provide education and support for participant on nutrition & aerobic/resistive exercise along with prescribed medications to achieve LDL <70mg60mL >40mg.61mExpected Outcomes  Short Term: Participant states understanding of desired cholesterol values and is compliant with medications prescribed. Participant is following exercise prescription and nutrition guidelines.;Long Term: Cholesterol controlled with medications as prescribed, with individualized exercise RX and with personalized nutrition plan. Value goals: LDL < 70mg, 7m> 40 mg.       Core Components/Risk Factors/Patient Goals Review:    Core Components/Risk Factors/Patient Goals at Discharge (Final Review):    ITP Comments: ITP Comments    Row Name 09/19/17 1341           ITP Comments  Med Review completed. Initial ITP created. [facility]          Comments: Initial ITP

## 2017-09-19 NOTE — Patient Instructions (Signed)
Patient Instructions  Patient Details  Name: Stuart Gentry MRN: [id number] Date of Birth: 09/28/1945 Referring Provider:  Josue Hector, MD  Below are your personal goals for exercise, nutrition, and risk factors. Our goal is to help you stay on track towards obtaining and maintaining these goals. We will be discussing your progress on these goals with you throughout the program.  Initial Exercise Prescription: Initial Exercise Prescription - 09/19/17 1400      Date of Initial Exercise RX and Referring Provider   Date  09/19/17    Referring Provider  [name]      Treadmill   MPH  2.5    Grade  0.5    Minutes  15    METs  3      Recumbant Bike   Level  3    RPM  60    Watts  30    Minutes  15    METs  3      NuStep   Level  3    SPM  80    Minutes  15    METs  3      Prescription Details   Frequency (times per week)  3    Duration  Progress to 45 minutes of aerobic exercise without signs/symptoms of physical distress      Intensity   THRR 40-80% of Max Heartrate  90-129    Ratings of Perceived Exertion  11-13    Perceived Dyspnea  0-4      Resistance Training   Training Prescription  Yes    Weight  3 lb    Reps  10-15       Exercise Goals: Frequency: Be able to perform aerobic exercise two to three times per week in program working toward 2-5 days per week of home exercise.  Intensity: Work with a perceived exertion of 11 (fairly light) - 15 (hard) while following your exercise prescription.  We will make changes to your prescription with you as you progress through the program.   Duration: Be able to do 30 to 45 minutes of continuous aerobic exercise in addition to a 5 minute warm-up and a 5 minute cool-down routine.   Nutrition Goals: Your personal nutrition goals will be established when you do your nutrition analysis with the dietician.  The following are general nutrition guidelines to follow: Cholesterol < 200mg /day Sodium < 1500mg /day Fiber:  Men over 50 yrs - 30 grams per day  Personal Goals: Personal Goals and Risk Factors at Admission - 09/19/17 1415      Core Components/Risk Factors/Patient Goals on Admission    Weight Management  Yes;Weight Maintenance    Intervention  Weight Management: Develop a combined nutrition and exercise program designed to reach desired caloric intake, while maintaining appropriate intake of nutrient and fiber, sodium and fats, and appropriate energy expenditure required for the weight goal.;Weight Management: Provide education and appropriate resources to help participant work on and attain dietary goals.    Admit Weight  200 lb (90.7 kg)    Expected Outcomes  Short Term: Continue to assess and modify interventions until short term weight is achieved;Long Term: Adherence to nutrition and physical activity/exercise program aimed toward attainment of established weight goal;Weight Maintenance: Understanding of the daily nutrition guidelines, which includes 25-35% calories from fat, 7% or less cal from saturated fats, less than 200mg  cholesterol, less than 1.5gm of sodium, & 5 or more servings of fruits and vegetables daily;Understanding recommendations for meals to include 15-35%  energy as protein, 25-35% energy from fat, 35-60% energy from carbohydrates, less than 200mg  of dietary cholesterol, 20-35 gm of total fiber daily;Understanding of distribution of calorie intake throughout the day with the consumption of 4-5 meals/snacks    Hypertension  Yes    Intervention  Provide education on lifestyle modifcations including regular physical activity/exercise, weight management, moderate sodium restriction and increased consumption of fresh fruit, vegetables, and low fat dairy, alcohol moderation, and smoking cessation.;Monitor prescription use compliance.    Expected Outcomes  Short Term: Continued assessment and intervention until BP is < 140/34mm HG in hypertensive participants. < 130/68mm HG in hypertensive  participants with diabetes, heart failure or chronic kidney disease.;Long Term: Maintenance of blood pressure at goal levels.    Lipids  Yes    Intervention  Provide education and support for participant on nutrition & aerobic/resistive exercise along with prescribed medications to achieve LDL 70mg , HDL >40mg .    Expected Outcomes  Short Term: Participant states understanding of desired cholesterol values and is compliant with medications prescribed. Participant is following exercise prescription and nutrition guidelines.;Long Term: Cholesterol controlled with medications as prescribed, with individualized exercise RX and with personalized nutrition plan. Value goals: LDL < 70mg , HDL > 40 mg.       Tobacco Use Initial Evaluation: Social History   Tobacco Use  Smoking Status Never Smoker  Smokeless Tobacco Never Used    Exercise Goals and Review: Exercise Goals    Row Name 09/19/17 1407             Exercise Goals   Increase Physical Activity  Yes       Intervention  Provide advice, education, support and counseling about physical activity/exercise needs.;Develop an individualized exercise prescription for aerobic and resistive training based on initial evaluation findings, risk stratification, comorbidities and participant's personal goals.       Expected Outcomes  Short Term: Attend rehab on a regular basis to increase amount of physical activity.;Long Term: Add in home exercise to make exercise part of routine and to increase amount of physical activity.;Long Term: Exercising regularly at least 3-5 days a week.       Increase Strength and Stamina  Yes       Intervention  Provide advice, education, support and counseling about physical activity/exercise needs.;Develop an individualized exercise prescription for aerobic and resistive training based on initial evaluation findings, risk stratification, comorbidities and participant's personal goals.       Expected Outcomes  Short Term:  Increase workloads from initial exercise prescription for resistance, speed, and METs.;Short Term: Perform resistance training exercises routinely during rehab and add in resistance training at home;Long Term: Improve cardiorespiratory fitness, muscular endurance and strength as measured by increased METs and functional capacity (6MWT)       Able to understand and use rate of perceived exertion (RPE) scale  Yes       Intervention  Provide education and explanation on how to use RPE scale       Expected Outcomes  Short Term: Able to use RPE daily in rehab to express subjective intensity level;Long Term:  Able to use RPE to guide intensity level when exercising independently       Able to understand and use Dyspnea scale  Yes       Intervention  Provide education and explanation on how to use Dyspnea scale       Expected Outcomes  Short Term: Able to use Dyspnea scale daily in rehab to express subjective sense of shortness  of breath during exertion;Long Term: Able to use Dyspnea scale to guide intensity level when exercising independently       Knowledge and understanding of Target Heart Rate Range (THRR)  Yes       Intervention  Provide education and explanation of THRR including how the numbers were predicted and where they are located for reference       Expected Outcomes  Short Term: Able to state/look up THRR;Short Term: Able to use daily as guideline for intensity in rehab;Long Term: Able to use THRR to govern intensity when exercising independently       Able to check pulse independently  Yes       Intervention  Provide education and demonstration on how to check pulse in carotid and radial arteries.;Review the importance of being able to check your own pulse for safety during independent exercise       Expected Outcomes  Short Term: Able to explain why pulse checking is important during independent exercise;Long Term: Able to check pulse independently and accurately       Understanding of Exercise  Prescription  Yes       Intervention  Provide education, explanation, and written materials on patient's individual exercise prescription       Expected Outcomes  Short Term: Able to explain program exercise prescription;Long Term: Able to explain home exercise prescription to exercise independently          Copy of goals given to participant.

## 2017-09-19 NOTE — Progress Notes (Signed)
Daily Session Note  Patient Details  Name: Stuart Gentry MRN: [id number] Date of Birth: 06-25-1945 Referring Provider:     Cardiac Rehab from 09/19/2017 in [facility] Cardiac and Pulmonary Rehab  Referring Provider  [name]      Encounter Date: 09/19/2017  Check In: Session Check In - 09/19/17 1149      Check-In   Location  [facility]-Cardiac & Pulmonary Rehab    Staff Present  [name], RN, BSN, CCRP;Meredith Sherryll Burger, RN Vickki Hearing, BA, ACSM CEP, Exercise Physiologist    Supervising physician immediately available to respond to emergencies  See telemetry face sheet for immediately available ER MD    Medication changes reported      No    Fall or balance concerns reported     No    Tobacco Cessation  No Change    Warm-up and Cool-down  Performed as group-led instruction    Resistance Training Performed  Yes    VAD Patient?  No      Pain Assessment   Currently in Pain?  No/denies          Social History   Tobacco Use  Smoking Status Never Smoker  Smokeless Tobacco Never Used    Goals Met:  Proper associated with RPD/PD & O2 Sat Exercise tolerated well Personal goals reviewed No report of cardiac concerns or symptoms Strength training completed today  Goals Unmet:  Not Applicable  Comments: Med Review completed    Dr. [name] is Medical Director for [place] and [facility].

## 2017-09-25 DIAGNOSIS — Z951 Presence of aortocoronary bypass graft: Secondary | ICD-10-CM | POA: Diagnosis not present

## 2017-09-25 NOTE — Progress Notes (Signed)
Daily Session Note  Patient Details  Name: Stuart Gentry MRN: [id number] Date of Birth: 12/14/1945 Referring Provider:     Cardiac Rehab from 09/19/2017 in [facility] Cardiac and Pulmonary Rehab  Referring Provider  [name]      Encounter Date: 09/25/2017  Check In: Session Check In - 09/25/17 0749      Check-In   Location  [facility]-Cardiac & Pulmonary Rehab    Staff Present  [name], RN, BSN, CCRP;Kino Dunsworth Darrin Nipper, [place], [place], [place], Exercise Physiologist    Supervising physician immediately available to respond to emergencies  See telemetry face sheet for immediately available ER MD    Medication changes reported      No    Fall or balance concerns reported     No    Tobacco Cessation  No Change    Warm-up and Cool-down  Performed on first and last piece of equipment    Resistance Training Performed  Yes    VAD Patient?  No      Pain Assessment   Currently in Pain?  No/denies          Social History   Tobacco Use  Smoking Status Never Smoker  Smokeless Tobacco Never Used    Goals Met:  Exercise tolerated well Personal goals reviewed No report of cardiac concerns or symptoms Strength training completed today  Goals Unmet:  Not Applicable  Comments: First full day of exercise!  Patient was oriented to gym and equipment including functions, settings, policies, and procedures.  Patient's individual exercise prescription and treatment plan were reviewed.  All starting workloads were established based on the results of the 6 minute walk test done at initial orientation visit.  The plan for exercise progression was also introduced and progression will be customized based on patient's performance and goals.   Dr. [name] is Medical Director for [place] and [facility].

## 2017-09-27 ENCOUNTER — Encounter: Payer: Medicare Other | Admitting: *Deleted

## 2017-09-27 DIAGNOSIS — Z951 Presence of aortocoronary bypass graft: Secondary | ICD-10-CM

## 2017-09-27 NOTE — Progress Notes (Signed)
Daily Session Note  Patient Details  Name: Stuart Gentry MRN: [id number] Date of Birth: [birth year] Referring Provider:     Cardiac Rehab from 09/19/2017 in [facility] Cardiac and Pulmonary Rehab  Referring Provider  [name]      Encounter Date: 09/27/2017  Check In: Session Check In - 09/27/17 0926      Check-In   Location  [facility]-Cardiac & Pulmonary Rehab    Staff Present  Renita Papa, RN BSN;Jessica Luan Pulling, MA, RCEP, CCRP, Exercise Physiologist;Amanda Oletta Darter, [place], ACSM CEP, Exercise Physiologist    Supervising physician immediately available to respond to emergencies  See telemetry face sheet for immediately available ER MD    Medication changes reported      No    Fall or balance concerns reported     No    Warm-up and Cool-down  Performed on first and last piece of equipment    Resistance Training Performed  Yes    VAD Patient?  No      Pain Assessment   Currently in Pain?  No/denies          Social History   Tobacco Use  Smoking Status Never Smoker  Smokeless Tobacco Never Used    Goals Met:  Independence with exercise equipment Exercise tolerated well No report of cardiac concerns or symptoms Strength training completed today  Goals Unmet:  Not Applicable  Comments: Pt able to follow exercise prescription today without complaint.  Will continue to monitor for progression.    Dr. [name] is Medical Director for [place] and [facility].

## 2017-09-30 ENCOUNTER — Encounter: Payer: Medicare Other | Attending: Cardiovascular Disease | Admitting: *Deleted

## 2017-09-30 DIAGNOSIS — Z951 Presence of aortocoronary bypass graft: Secondary | ICD-10-CM | POA: Insufficient documentation

## 2017-09-30 NOTE — Progress Notes (Signed)
Daily Session Note  Patient Details  Name: Stuart Gentry MRN: [id number] Date of Birth: March 17, 1946 Referring Provider:     Cardiac Rehab from 09/19/2017 in [facility] Cardiac and Pulmonary Rehab  Referring Provider  [name]      Encounter Date: 09/30/2017  Check In: Session Check In - 09/30/17 0831      Check-In   Location  [facility]-Cardiac & Pulmonary Rehab    Staff Present  Earlean Shawl, BS, ACSM CEP, Exercise Physiologist;Mandi Zachery Conch, BS, PEC;Susanne Bice, RN, BSN, CCRP    Supervising physician immediately available to respond to emergencies  See telemetry face sheet for immediately available ER MD    Medication changes reported      No    Fall or balance concerns reported     No    Tobacco Cessation  No Change    Warm-up and Cool-down  Performed on first and last piece of equipment    Resistance Training Performed  Yes    VAD Patient?  No      Pain Assessment   Currently in Pain?  No/denies    Multiple Pain Sites  No          Social History   Tobacco Use  Smoking Status Never Smoker  Smokeless Tobacco Never Used    Goals Met:  Independence with exercise equipment Exercise tolerated well No report of cardiac concerns or symptoms Strength training completed today  Goals Unmet:  Not Applicable  Comments: Pt able to follow exercise prescription today without complaint.  Will continue to monitor for progression.    Dr. [name] is Medical Director for [place] and [facility].

## 2017-10-02 DIAGNOSIS — Z951 Presence of aortocoronary bypass graft: Secondary | ICD-10-CM

## 2017-10-02 NOTE — Progress Notes (Signed)
Daily Session Note  Patient Details  Name: Stuart Gentry MRN: [id number] Date of Birth: May 22, 1945 Referring Provider:     Cardiac Rehab from 09/19/2017 in [facility] Cardiac and Pulmonary Rehab  Referring Provider  Stuart Gentry      Encounter Date: 10/02/2017  Check In: Session Check In - 10/02/17 0748      Check-In   Location  [facility]-Cardiac & Pulmonary Rehab    Staff Present  Stuart Lark, RN, BSN, CCRP;Stuart Prehn Christain [place], RN BSN    Supervising physician immediately available to respond to emergencies  See telemetry face sheet for immediately available ER MD    Medication changes reported      No    Fall or balance concerns reported     No    Tobacco Cessation  No Change    Warm-up and Cool-down  Performed on first and last piece of equipment    Resistance Training Performed  Yes    VAD Patient?  No      Pain Assessment   Currently in Pain?  No/denies          Social History   Tobacco Use  Smoking Status Never Smoker  Smokeless Tobacco Never Used    Goals Met:  Independence with exercise equipment Exercise tolerated well No report of cardiac concerns or symptoms Strength training completed today  Goals Unmet:  Not Applicable  Comments: Pt able to follow exercise prescription today without complaint.  Will continue to monitor for progression.   Dr. Emily Gentry is Medical Director for [place] and [facility].

## 2017-10-04 DIAGNOSIS — Z951 Presence of aortocoronary bypass graft: Secondary | ICD-10-CM | POA: Diagnosis not present

## 2017-10-04 NOTE — Progress Notes (Signed)
Daily Session Note  Patient Details  Name: Stuart Gentry MRN: [id number] Date of Birth: 12/26/1945 Referring Provider:     Cardiac Rehab from 09/19/2017 in [facility] Cardiac and Pulmonary Rehab  Referring Provider  [name]      Encounter Date: 10/04/2017  Check In: Session Check In - 10/04/17 0846      Check-In   Location  [facility]-Cardiac & Pulmonary Rehab    Staff Present  Nana Addai, RN BSN;Mandi Ballard, BS, PEC; , BA, ACSM CEP, Exercise Physiologist    Supervising physician immediately available to respond to emergencies  See telemetry face sheet for immediately available ER MD    Medication changes reported      No    Fall or balance concerns reported     No    Warm-up and Cool-down  Performed on first and last piece of equipment    Resistance Training Performed  Yes    VAD Patient?  No      Pain Assessment   Currently in Pain?  No/denies    Multiple Pain Sites  No          Social History   Tobacco Use  Smoking Status Never Smoker  Smokeless Tobacco Never Used    Goals Met:  Independence with exercise equipment Exercise tolerated well No report of cardiac concerns or symptoms Strength training completed today  Goals Unmet:  Not Applicable  Comments: Pt able to follow exercise prescription today without complaint.  Will continue to monitor for progression.    Dr. Mark Miller is Medical Director for [facility] and [facility]. 

## 2017-10-07 ENCOUNTER — Encounter: Payer: Medicare Other | Admitting: *Deleted

## 2017-10-07 DIAGNOSIS — Z951 Presence of aortocoronary bypass graft: Secondary | ICD-10-CM

## 2017-10-07 NOTE — Progress Notes (Signed)
Daily Session Note  Patient Details  Name: Stuart Gentry MRN: [id number] Date of Birth: 09/07/1945 Referring Provider:     Cardiac Rehab from 09/19/2017 in [facility] Cardiac and Pulmonary Rehab  Referring Provider  [name]      Encounter Date: 10/07/2017  Check In: Session Check In - 10/07/17 0747      Check-In   Location  [facility]-Cardiac & Pulmonary Rehab    Staff Present  [name], RN, BSN, CCRP;Jessica Luan Pulling, MA, RCEP, CCRP, Exercise Physiologist;Annabella Elford Amedeo Plenty, BS, ACSM CEP, Exercise Physiologist    Supervising physician immediately available to respond to emergencies  See telemetry face sheet for immediately available ER MD    Medication changes reported      No    Fall or balance concerns reported     No    Tobacco Cessation  No Change    Warm-up and Cool-down  Performed on first and last piece of equipment    Resistance Training Performed  Yes    VAD Patient?  No      Pain Assessment   Currently in Pain?  No/denies    Multiple Pain Sites  No          Social History   Tobacco Use  Smoking Status Never Smoker  Smokeless Tobacco Never Used    Goals Met:  Independence with exercise equipment Exercise tolerated well No report of cardiac concerns or symptoms Strength training completed today  Goals Unmet:  Not Applicable  Comments: Pt able to follow exercise prescription today without complaint.  Will continue to monitor for progression.    Dr. [name] is Medical Director for [place] and [facility].

## 2017-10-09 DIAGNOSIS — Z951 Presence of aortocoronary bypass graft: Secondary | ICD-10-CM | POA: Diagnosis not present

## 2017-10-09 NOTE — Progress Notes (Signed)
Daily Session Note  Patient Details  Name: Stuart Gentry MRN: [id number] Date of Birth: 1945-09-20 Referring Provider:     Cardiac Rehab from 09/19/2017 in [facility] Cardiac and Pulmonary Rehab  Referring Provider  [name]      Encounter Date: 10/09/2017  Check In: Session Check In - 10/09/17 0742      Check-In   Location  [facility]-Cardiac & Pulmonary Rehab    Staff Present  Justin Mend RCP,RRT,BSRT;[name], RN, BSN, CCRP;Jessica Luan Pulling, MA, RCEP, CCRP, Exercise Physiologist    Supervising physician immediately available to respond to emergencies  See telemetry face sheet for immediately available ER MD    Medication changes reported      No    Fall or balance concerns reported     No    Tobacco Cessation  No Change    Warm-up and Cool-down  Performed on first and last piece of equipment    Resistance Training Performed  Yes    VAD Patient?  No      Pain Assessment   Currently in Pain?  No/denies        Exercise Prescription Changes - 10/08/17 1500      Response to Exercise   Blood Pressure (Admit)  158/80    Blood Pressure (Exercise)  128/60    Blood Pressure (Exit)  120/72    Heart Rate (Admit)  46 bpm    Heart Rate (Exercise)  90 bpm    Heart Rate (Exit)  52 bpm    Rating of Perceived Exertion (Exercise)  13    Symptoms  none    Duration  Continue with 45 min of aerobic exercise without signs/symptoms of physical distress.    Intensity  THRR unchanged      Progression   Progression  Continue to progress workloads to maintain intensity without signs/symptoms of physical distress.    Average METs  3.8      Resistance Training   Training Prescription  Yes    Weight  4 lbs    Reps  10-15      Interval Training   Interval Training  No      Treadmill   MPH  2.8    Grade  0.5    Minutes  15    METs  3.34      Recumbant Bike   Level  6    Watts  64    Minutes  15    METs  4.27      NuStep   Level  5    Minutes  15    METs  3.8       Social History    Tobacco Use  Smoking Status Never Smoker  Smokeless Tobacco Never Used    Goals Met:  Independence with exercise equipment Exercise tolerated well No report of cardiac concerns or symptoms Strength training completed today  Goals Unmet:  Not Applicable  Comments: Pt able to follow exercise prescription today without complaint.  Will continue to monitor for progression.   Dr. [name] is Medical Director for [place] and [facility].

## 2017-10-09 NOTE — Telephone Encounter (Signed)
I have spoken with Mrs. Rae Lips and apologized if our staff member's had made her feel as if they weren't listening to her. I asked her if she had an invoice number I could reference when I reached out to [place], she said NO I don't have an invoice number I only have the EOB's from [employer] and Medicare. So I asked could she tell me what date of service she was referencing to she said yes that date was 06/11/17. I looked at the order that was placed for the labs prior to his appointment and saw the only dx code of Z00.00 which is for routine exam which Medicare doesn't pay for. I looked back at the office visit and saw additional codes that were added to that visit however I don't believe they were associated to the lab work. I have sent Jocelyn Lamer a message and asked her to add I10 Benign essential HTN, E03.9 Subclinical hypothyroidism,  E78.00 Pure hypercholesterolemia, Z12.5 Prostate cancer screening and R07.89 Atypical chest pain. I also advised patient that I would have Jocelyn Lamer contact her once these codes have been added.

## 2017-10-11 ENCOUNTER — Encounter: Payer: Medicare Other | Admitting: *Deleted

## 2017-10-11 DIAGNOSIS — Z951 Presence of aortocoronary bypass graft: Secondary | ICD-10-CM

## 2017-10-11 NOTE — Progress Notes (Signed)
Daily Session Note  Patient Details  Name: Adar Rase MRN: [id number] Date of Birth: 04-18-1946 Referring Provider:     Cardiac Rehab from 09/19/2017 in [facility] Cardiac and Pulmonary Rehab  Referring Provider  [name]      Encounter Date: 10/11/2017  Check In: Session Check In - 10/11/17 0911      Check-In   Location  [facility]-Cardiac & Pulmonary Rehab    Staff Present  Renita Papa, RN BSN;Jessica Luan Pulling, MA, RCEP, CCRP, Exercise Physiologist;Amanda Oletta Darter, [place], ACSM CEP, Exercise Physiologist    Supervising physician immediately available to respond to emergencies  See telemetry face sheet for immediately available ER MD    Medication changes reported      No    Fall or balance concerns reported     No    Warm-up and Cool-down  Performed on first and last piece of equipment    Resistance Training Performed  Yes    VAD Patient?  No      Pain Assessment   Currently in Pain?  No/denies          Social History   Tobacco Use  Smoking Status Never Smoker  Smokeless Tobacco Never Used    Goals Met:  Independence with exercise equipment Exercise tolerated well No report of cardiac concerns or symptoms Strength training completed today  Goals Unmet:  Not Applicable  Comments: Pt able to follow exercise prescription today without complaint.  Will continue to monitor for progression.    Dr. [name] is Medical Director for [place] and [facility].

## 2017-10-14 ENCOUNTER — Encounter: Payer: Medicare Other | Admitting: *Deleted

## 2017-10-14 DIAGNOSIS — Z951 Presence of aortocoronary bypass graft: Secondary | ICD-10-CM | POA: Diagnosis not present

## 2017-10-14 NOTE — Progress Notes (Signed)
Daily Session Note  Patient Details  Name: Deagen Krass MRN: [id number] Date of Birth: [birth year] Referring Provider:     Cardiac Rehab from 09/19/2017 in [facility]  Referring Provider  [name]      Encounter Date: 10/14/2017  Check In: Session Check In - 10/14/17 0757      Check-In   Location  [facility]    Staff Present  [name], RN, BSN, CCRP;Jessica Luan Pulling, MA, RCEP, CCRP, Exercise Physiologist;Kelly Amedeo Plenty, BS, ACSM CEP, Exercise Physiologist    Supervising physician immediately available to respond to emergencies  See telemetry face sheet for immediately available ER MD    Medication changes reported      No    Fall or balance concerns reported     No    Warm-up and Cool-down  Performed on first and last piece of equipment    Resistance Training Performed  Yes    VAD Patient?  No      Pain Assessment   Currently in Pain?  No/denies          Social History   Tobacco Use  Smoking Status Never Smoker  Smokeless Tobacco Never Used    Goals Met:  Independence with exercise equipment Exercise tolerated well No report of cardiac concerns or symptoms Strength training completed today  Goals Unmet:  Not Applicable  Comments: Pt able to follow exercise prescription today without complaint.  Will continue to monitor for progression. Reviewed home exercise with pt today.  Pt plans to use treadmill at home for exercise.  Reviewed THR, pulse, RPE, sign and symptoms, NTG use, and when to call 911 or MD.  Also discussed weather considerations and indoor options.  Pt voiced understanding.    Dr. [name] is Medical Director for [place] and [facility].

## 2017-10-16 ENCOUNTER — Encounter: Payer: Self-pay | Admitting: *Deleted

## 2017-10-16 DIAGNOSIS — Z951 Presence of aortocoronary bypass graft: Secondary | ICD-10-CM

## 2017-10-16 NOTE — Progress Notes (Signed)
Cardiac Individual Treatment Plan  Patient Details  Name: Stuart Gentry MRN: [id number] Date of Birth: 03-28-1946 Referring Provider:     Cardiac Rehab from 09/19/2017 in [facility] Cardiac and Pulmonary Rehab  Referring Provider  [name]      Initial Encounter Date:    Cardiac Rehab from 09/19/2017 in [facility] Cardiac and Pulmonary Rehab  Date  09/19/17  Referring Provider  [name]      Visit Diagnosis: S/P CABG x 6  Patient's Home Medications on Admission:  Current Outpatient Medications:  .  acetaminophen (TYLENOL) 325 MG tablet, Take 2 tablets (650 mg total) by mouth every 6 (six) hours as needed for mild pain., Disp: , Rfl:  .  amiodarone (PACERONE) 200 MG tablet, Take 1 tablet (200 mg total) by mouth daily. (Patient not taking: Reported on 09/19/2017), Disp: 30 tablet, Rfl: 6 .  aspirin EC 81 MG tablet, Take 1 tablet (81 mg total) by mouth daily., Disp: , Rfl:  .  atorvastatin (LIPITOR) 80 MG tablet, Take 1 tablet (80 mg total) by mouth daily at 6 PM., Disp: 30 tablet, Rfl: 3 .  esomeprazole (NEXIUM) 20 MG capsule, Take 20 mg by mouth every morning. , Disp: , Rfl:  .  levothyroxine (SYNTHROID) 25 MCG tablet, Take 1 tablet (25 mcg total) by mouth daily before breakfast., Disp: 30 tablet, Rfl: 5 .  mesalamine (ROWASA) 4 g enema, Place 60 mLs (4 g total) rectally at bedtime as needed. Place 60 mls rectally at bedtime, Disp: 1800 mL, Rfl: 6 .  Metoprolol Tartrate 37.5 MG TABS, Take 37.5 mg by mouth 2 (two) times daily., Disp: 60 tablet, Rfl: 3 .  Multiple Vitamin (MULTIVITAMIN) tablet, Take 1 tablet by mouth daily., Disp: , Rfl:  .  polyethylene glycol (MIRALAX / GLYCOLAX) packet, Take 17 g by mouth daily. , Disp: , Rfl:  .  sulfaSALAzine (AZULFIDINE) 500 MG tablet, TAKE 4 TABLETS BY MOUTH 3  TIMES DAILY, Disp: 1080 tablet, Rfl: 11  Past Medical History: Past Medical History:  Diagnosis Date  . Chronic kidney disease    kidney stones?  . Coronary artery calcification seen on CAT scan    . GERD (gastroesophageal reflux disease)   . Hypercholesteremia   . Hypertension   . Hypothyroidism    no meds  . Sleep apnea    never been tested  . Ulcerative colitis     Tobacco Use: Social History   Tobacco Use  Smoking Status Never Smoker  Smokeless Tobacco Never Used    Labs: Recent Review Flowsheet Data    Labs for ITP Cardiac and Pulmonary Rehab Latest Ref Rng & Units 08/12/2017 08/12/2017 08/12/2017 08/12/2017 08/13/2017   Cholestrol <200 mg/dL - - - - -   LDLCALC mg/dL (calc) - - - - -   HDL >40 mg/dL - - - - -   Trlycerides <150 mg/dL - - - - -   Hemoglobin A1c 4.8 - 5.6 % - - - - -   PHART 7.350 - 7.450 7.333(L) 7.382 7.346(L) - -   PCO2ART 32.0 - 48.0 mmHg 42.2 31.8(L) 35.7 - -   HCO3 20.0 - 28.0 mmol/L 22.7 19.0(L) 19.5(L) - -   TCO2 22 - 32 mmol/L 24 20(L) 21(L) 19(L) 23   ACIDBASEDEF 0.0 - 2.0 mmol/L 3.0(H) 5.0(H) 6.0(H) - -   O2SAT % 95.0 92.0 97.0 - -       Exercise Target Goals:    Exercise Program Goal: Individual exercise prescription set using results from initial  6 min walk test and THRR while considering  patient's activity barriers and safety.   Exercise Prescription Goal: Initial exercise prescription builds to 30-45 minutes a day of aerobic activity, 2-3 days per week.  Home exercise guidelines will be given to patient during program as part of exercise prescription that the participant will acknowledge.  Activity Barriers & Risk Stratification: Activity Barriers & Cardiac Risk Stratification - 09/19/17 1419      Activity Barriers & Cardiac Risk Stratification   Activity Barriers  None    Cardiac Risk Stratification  Moderate       6 Minute Walk: 6 Minute Walk    Row Name 09/19/17 1408         6 Minute Walk   Distance  1320 feet     Walk Time  6 minutes     # of Rest Breaks  0     MPH  2.5     METS  3.18     RPE  12     Perceived Dyspnea   0     VO2 Peak  11.14     Symptoms  No     Resting HR  51 bpm     Resting BP  136/56      Resting Oxygen Saturation   99 %     Exercise Oxygen Saturation  during 6 min walk  96 %     Max Ex. HR  106 bpm     Max Ex. BP  158/68     2 Minute Post BP  124/74        Oxygen Initial Assessment:   Oxygen Re-Evaluation:   Oxygen Discharge (Final Oxygen Re-Evaluation):   Initial Exercise Prescription: Initial Exercise Prescription - 09/19/17 1400      Date of Initial Exercise RX and Referring Provider   Date  09/19/17    Referring Provider  [name]      Treadmill   MPH  2.5    Grade  0.5    Minutes  15    METs  3      Recumbant Bike   Level  3    RPM  60    Watts  30    Minutes  15    METs  3      NuStep   Level  3    SPM  80    Minutes  15    METs  3      Prescription Details   Frequency (times per week)  3    Duration  Progress to 45 minutes of aerobic exercise without signs/symptoms of physical distress      Intensity   THRR 40-80% of Max Heartrate  90-129    Ratings of Perceived Exertion  11-13    Perceived Dyspnea  0-4      Resistance Training   Training Prescription  Yes    Weight  3 lb    Reps  10-15       Perform Capillary Blood Glucose checks as needed.  Exercise Prescription Changes: Exercise Prescription Changes    Row Name 09/19/17 1400 09/25/17 1500 10/08/17 1500 10/14/17 0800       Response to Exercise   Blood Pressure (Admit)  136/56  124/74  158/80  -    Blood Pressure (Exercise)  158/68  130/62  128/60  -    Blood Pressure (Exit)  124/74  124/74  120/72  -    Heart Rate (Admit)  51  bpm  47 bpm  46 bpm  -    Heart Rate (Exercise)  106 bpm  86 bpm  90 bpm  -    Heart Rate (Exit)  56 bpm  54 bpm  52 bpm  -    Rating of Perceived Exertion (Exercise)  -  12  13  -    Symptoms  -  none  none  -    Comments  -  first full day of exercise  -  -    Duration  -  Progress to 45 minutes of aerobic exercise without signs/symptoms of physical distress  Continue with 45 min of aerobic exercise without signs/symptoms of physical  distress.  -    Intensity  -  THRR unchanged  THRR unchanged  -      Progression   Progression  -  Continue to progress workloads to maintain intensity without signs/symptoms of physical distress.  Continue to progress workloads to maintain intensity without signs/symptoms of physical distress.  -    Average METs  -  3  3.8  -      Resistance Training   Training Prescription  -  Yes  Yes  -    Weight  -  body weight  4 lbs  -    Reps  -  10-15  10-15  -      Interval Training   Interval Training  -  No  No  -      Treadmill   MPH  -  2.5  2.8  -    Grade  -  0.5  0.5  -    Minutes  -  15  15  -    METs  -  3  3.34  -      Recumbant Bike   Level  -  3  6  -    Watts  -  29  64  -    Minutes  -  15  15  -    METs  -  3  4.27  -      NuStep   Level  -  -  5  -    Minutes  -  -  15  -    METs  -  -  3.8  -      Home Exercise Plan   Plans to continue exercise at  -  -  -  Home (comment) treadmill    Frequency  -  -  -  Add 2 additional days to program exercise sessions.    Initial Home Exercises Provided  -  -  -  10/14/17       Exercise Comments: Exercise Comments    Row Name 09/25/17 0750           Exercise Comments  First full day of exercise!  Patient was oriented to gym and equipment including functions, settings, policies, and procedures.  Patient's individual exercise prescription and treatment plan were reviewed.  All starting workloads were established based on the results of the 6 minute walk test done at initial orientation visit.  The plan for exercise progression was also introduced and progression will be customized based on patient's performance and goals.          Exercise Goals and Review: Exercise Goals    Row Name 09/19/17 1407             Exercise Goals   Increase Physical Activity  Yes       Intervention  Provide advice, education, support and counseling about physical activity/exercise needs.;Develop an individualized exercise prescription  for aerobic and resistive training based on initial evaluation findings, risk stratification, comorbidities and participant's personal goals.       Expected Outcomes  Short Term: Attend rehab on a regular basis to increase amount of physical activity.;Long Term: Add in home exercise to make exercise part of routine and to increase amount of physical activity.;Long Term: Exercising regularly at least 3-5 days a week.       Increase Strength and Stamina  Yes       Intervention  Provide advice, education, support and counseling about physical activity/exercise needs.;Develop an individualized exercise prescription for aerobic and resistive training based on initial evaluation findings, risk stratification, comorbidities and participant's personal goals.       Expected Outcomes  Short Term: Increase workloads from initial exercise prescription for resistance, speed, and METs.;Short Term: Perform resistance training exercises routinely during rehab and add in resistance training at home;Long Term: Improve cardiorespiratory fitness, muscular endurance and strength as measured by increased METs and functional capacity (6MWT)       Able to understand and use rate of perceived exertion (RPE) scale  Yes       Intervention  Provide education and explanation on how to use RPE scale       Expected Outcomes  Short Term: Able to use RPE daily in rehab to express subjective intensity level;Long Term:  Able to use RPE to guide intensity level when exercising independently       Able to understand and use Dyspnea scale  Yes       Intervention  Provide education and explanation on how to use Dyspnea scale       Expected Outcomes  Short Term: Able to use Dyspnea scale daily in rehab to express subjective sense of shortness of breath during exertion;Long Term: Able to use Dyspnea scale to guide intensity level when exercising independently       Knowledge and understanding of Target Heart Rate Range (THRR)  Yes        Intervention  Provide education and explanation of THRR including how the numbers were predicted and where they are located for reference       Expected Outcomes  Short Term: Able to state/look up THRR;Short Term: Able to use daily as guideline for intensity in rehab;Long Term: Able to use THRR to govern intensity when exercising independently       Able to check pulse independently  Yes       Intervention  Provide education and demonstration on how to check pulse in carotid and radial arteries.;Review the importance of being able to check your own pulse for safety during independent exercise       Expected Outcomes  Short Term: Able to explain why pulse checking is important during independent exercise;Long Term: Able to check pulse independently and accurately       Understanding of Exercise Prescription  Yes       Intervention  Provide education, explanation, and written materials on patient's individual exercise prescription       Expected Outcomes  Short Term: Able to explain program exercise prescription;Long Term: Able to explain home exercise prescription to exercise independently          Exercise Goals Re-Evaluation : Exercise Goals Re-Evaluation    Row Name 09/25/17 0750 10/08/17 1536 10/14/17 0981  Exercise Goal Re-Evaluation   Exercise Goals Review  Understanding of Exercise Prescription;Knowledge and understanding of Target Heart Rate Range (THRR);Able to understand and use rate of perceived exertion (RPE) scale  Increase Physical Activity;Increase Strength and Stamina;Understanding of Exercise Prescription  Increase Physical Activity;Able to understand and use rate of perceived exertion (RPE) scale;Knowledge and understanding of Target Heart Rate Range (THRR);Understanding of Exercise Prescription;Increase Strength and Stamina;Able to check pulse independently     Comments  Reviewed RPE scale, THR and program prescription with pt today.  Pt voiced understanding and was given  a copy of goals to take home.   Dreshaun is off to a good start in rehab.  He is now up to 64 watts on the recumbent bike.  We will continue to monitor his progression.   Reviewed home exercise with pt today.  Pt plans to use treadmill at home for exercise.  Reviewed THR, pulse, RPE, sign and symptoms, NTG use, and when to call 911 or MD.  Also discussed weather considerations and indoor options.  Pt voiced understanding.     Expected Outcomes  Short: Use RPE daily to regulate intensity.  Long: Follow program prescription in THR.  Short: Review home exercise guidelines.  Long: Continue to follow program prescription.   Short: Add in at least one extra day a week. Long: Continue to exercise independently        Discharge Exercise Prescription (Final Exercise Prescription Changes): Exercise Prescription Changes - 10/14/17 0800      Home Exercise Plan   Plans to continue exercise at  Home (comment) treadmill    Frequency  Add 2 additional days to program exercise sessions.    Initial Home Exercises Provided  10/14/17       Nutrition:  Target Goals: Understanding of nutrition guidelines, daily intake of sodium <1525m, cholesterol <2068m calories 30% from fat and 7% or less from saturated fats, daily to have 5 or more servings of fruits and vegetables.  Biometrics: Pre Biometrics - 09/19/17 1406      Pre Biometrics   Height  5' 11"  (1.803 m)    Weight  200 lb 11.2 oz (91 kg)    Waist Circumference  41 inches    Hip Circumference  42 inches    Waist to Hip Ratio  0.98 %    BMI (Calculated)  28    Single Leg Stand  8.07 seconds        Nutrition Therapy Plan and Nutrition Goals: Nutrition Therapy & Goals - 10/14/17 1135      Nutrition Therapy   Diet  Instructed patient accompanied by his wife on a meal plan based on heart healthy dietary principles    Drug/Food Interactions  Statins/Certain Fruits    Protein (specify units)  8    Fiber  30 grams    Whole Grain Foods  3 servings     Saturated Fats  14 max. grams    Fruits and Vegetables  5 servings/day    Sodium  1500 grams      Personal Nutrition Goals   Nutrition Goal  Continue to read labels for saturated fat and sodium.    Personal Goal #2  Work to increase fruit/vegetable intake.    Personal Goal #3  Try very low sodium tuna; Also, try a variety of Ms. DASH seasonings- lemon pepper, garlic herb or Ms. DASH packets of taco seasoning, pot roast or chili seasonings.    Personal Goal #4  Increase water intake to 6-8 cups  daily.    Additional Goals?  No      Intervention Plan   Intervention  Prescribe, educate and counsel regarding individualized specific dietary modifications aiming towards targeted core components such as weight, hypertension, lipid management, diabetes, heart failure and other comorbidities.;Nutrition handout(s) given to patient.    Expected Outcomes  Short Term Goal: Understand basic principles of dietary content, such as calories, fat, sodium, cholesterol and nutrients.;Short Term Goal: A plan has been developed with personal nutrition goals set during dietitian appointment.;Long Term Goal: Adherence to prescribed nutrition plan.       Nutrition Assessments: Nutrition Assessments - 09/19/17 1145      MEDFICTS Scores   Pre Score  38       Nutrition Goals Re-Evaluation:   Nutrition Goals Discharge (Final Nutrition Goals Re-Evaluation):   Psychosocial: Target Goals: Acknowledge presence or absence of significant depression and/or stress, maximize coping skills, provide positive support system. Participant is able to verbalize types and ability to use techniques and skills needed for reducing stress and depression.   Initial Review & Psychosocial Screening: Initial Psych Review & Screening - 09/19/17 1416      Initial Review   Current issues with  Current Stress Concerns    Source of Stress Concerns  Unable to perform yard/household activities;Unable to participate in former interests or  hobbies      [place]?  Yes wife, son, brothers (out of state)       Barriers   Psychosocial barriers to participate in program  There are no identifiable barriers or psychosocial needs.;The patient should benefit from training in stress management and relaxation.      Screening Interventions   Interventions  Program counselor consult;Encouraged to exercise;Provide feedback about the scores to participant;To provide support and resources with identified psychosocial needs    Expected Outcomes  Short Term goal: Utilizing psychosocial counselor, staff and physician to assist with identification of specific Stressors or current issues interfering with healing process. Setting desired goal for each stressor or current issue identified.;Long Term Goal: Stressors or current issues are controlled or eliminated.;Short Term goal: Identification and review with participant of any Quality of Life or Depression concerns found by scoring the questionnaire.;Long Term goal: The participant improves quality of Life and PHQ9 Scores as seen by post scores and/or verbalization of changes       Quality of Life Scores:  Quality of Life - 09/19/17 1146      Quality of Life Scores   Health/Function Pre  0.23 %    Socioeconomic Pre  28.33 %    Psych/Spiritual Pre  27.33 %    Family Pre  28.8 %    GLOBAL Pre  27.73 %      Scores of 19 and below usually indicate a poorer quality of life in these areas.  A difference of  2-3 points is a clinically meaningful difference.  A difference of 2-3 points in the total score of the Quality of Life Index has been associated with significant improvement in overall quality of life, self-image, physical symptoms, and general health in studies assessing change in quality of life.  PHQ-9: Recent Review Flowsheet Data    Depression screen [facility] 2/9 09/19/2017 06/14/2017 06/14/2017 10/05/2013   Decreased Interest 0 0 0 0   Down, Depressed, Hopeless 0 0 0 0    PHQ - 2 Score 0 0 0 0   Altered sleeping 0 - - -   Tired, decreased energy 1 - - -  Change in appetite 0 - - -   Feeling bad or failure about yourself  1 - - -   Trouble concentrating 0 - - -   Moving slowly or fidgety/restless 0 - - -   Suicidal thoughts 0 - - -   PHQ-9 Score 2 - - -   Difficult doing work/chores Not difficult at all - - -     Interpretation of Total Score  Total Score Depression Severity:  1-4 = Minimal depression, 5-9 = Mild depression, 10-14 = Moderate depression, 15-19 = Moderately severe depression, 20-27 = Severe depression   Psychosocial Evaluation and Intervention: Psychosocial Evaluation - 09/30/17 0947      Psychosocial Evaluation & Interventions   Interventions  Encouraged to exercise with the program and follow exercise prescription    Comments  Counselor met with Mr. Grisby Jenny Reichmann) today for initial psychosocial evaluation.  He is a 72 year old who has a strong support system with a spouse of 30+ years; an adult son locally and lots of family in [place] who stay in touch frequently.  Sahas had a CABGx6 in mid April as well as HB and a history of ulcerative colitis for over 40 years.  He is also currently struggling with some thyroid issues resulting in tiredness and is being monitored by his Dr on this.  Makail reports he sleeps well and has a good appetite.  He denies a history of depression or anxiety or any current symptoms and is typically in a positive mood.  Alton states his biggest stressor is his health and the frustration of how it limits his activities.  Aengus has goals for increasing his energy to be "better than before" so he can return to playing golf with his grandson again.   He already is enjoying the program here and is inspired by the other participants and their commitment to exercise.  STaff will follow with Jenny Reichmann.    Expected Outcomes  Short:  [place] will increase his stamina and strength to be able to play golf again with his grandson.   Long:  Keeyon  will continue to exercise consistently to improve his overall health.      Continue Psychosocial Services   Follow up required by staff       Psychosocial Re-Evaluation:   Psychosocial Discharge (Final Psychosocial Re-Evaluation):   Vocational Rehabilitation: Provide vocational rehab assistance to qualifying candidates.   Vocational Rehab Evaluation & Intervention: Vocational Rehab - 09/19/17 1149      Initial Vocational Rehab Evaluation & Intervention   Assessment shows need for Vocational Rehabilitation  No       Education: Education Goals: Education classes will be provided on a variety of topics geared toward better understanding of heart health and risk factor modification. Participant will state understanding/return demonstration of topics presented as noted by education test scores.  Learning Barriers/Preferences: Learning Barriers/Preferences - 09/19/17 1417      Learning Barriers/Preferences   Learning Barriers  None    Learning Preferences  None       Education Topics:  AED/CPR: - Group verbal and written instruction with the use of models to demonstrate the basic use of the AED with the basic ABC's of resuscitation.   General Nutrition Guidelines/Fats and Fiber: -Group instruction provided by verbal, written material, models and posters to present the general guidelines for heart healthy nutrition. Gives an explanation and review of dietary fats and fiber.   Controlling Sodium/Reading Food Labels: -Group verbal and written material supporting the  discussion of sodium use in heart healthy nutrition. Review and explanation with models, verbal and written materials for utilization of the food label.   Cardiac Rehab from 10/14/2017 in [facility] Cardiac and Pulmonary Rehab  Date  09/30/17  Educator  CR  Instruction Review Code  1- Verbalizes Understanding      Exercise Physiology & General Exercise Guidelines: - Group verbal and written instruction with models to  review the exercise physiology of the cardiovascular system and associated critical values. Provides general exercise guidelines with specific guidelines to those with heart or lung disease.    Cardiac Rehab from 10/14/2017 in [facility] Cardiac and Pulmonary Rehab  Date  10/07/17  Educator  [facility]  Instruction Review Code  1- Verbalizes Understanding      Aerobic Exercise & Resistance Training: - Gives group verbal and written instruction on the various components of exercise. Focuses on aerobic and resistive training programs and the benefits of this training and how to safely progress through these programs..   Cardiac Rehab from 10/14/2017 in [facility] Cardiac and Pulmonary Rehab  Date  10/14/17  Educator  [facility]  Instruction Review Code  1- Geologist, engineering, Balance, Mind/Body Relaxation: Provides group verbal/written instruction on the benefits of flexibility and balance training, including mind/body exercise modes such as yoga, pilates and tai chi.  Demonstration and skill practice provided.   Stress and Anxiety: - Provides group verbal and written instruction about the health risks of elevated stress and causes of high stress.  Discuss the correlation between heart/lung disease and anxiety and treatment options. Review healthy ways to manage with stress and anxiety.   Depression: - Provides group verbal and written instruction on the correlation between heart/lung disease and depressed mood, treatment options, and the stigmas associated with seeking treatment.   Cardiac Rehab from 10/14/2017 in [facility] Cardiac and Pulmonary Rehab  Date  10/09/17  Educator  [facility]  Instruction Review Code  1- Verbalizes Understanding      Anatomy & Physiology of the Heart: - Group verbal and written instruction and models provide basic cardiac anatomy and physiology, with the coronary electrical and arterial systems. Review of Valvular disease and Heart Failure   Cardiac Procedures: - Group  verbal and written instruction to review commonly prescribed medications for heart disease. Reviews the medication, class of the drug, and side effects. Includes the steps to properly store meds and maintain the prescription regimen. (beta blockers and nitrates)   Cardiac Medications I: - Group verbal and written instruction to review commonly prescribed medications for heart disease. Reviews the medication, class of the drug, and side effects. Includes the steps to properly store meds and maintain the prescription regimen.   Cardiac Medications II: -Group verbal and written instruction to review commonly prescribed medications for heart disease. Reviews the medication, class of the drug, and side effects. (all other drug classes)   Cardiac Rehab from 10/14/2017 in [facility] [facility]  Date  10/02/17  Educator  SB  Instruction Review Code  1- Verbalizes Understanding       Go Sex-Intimacy & Heart Disease, Get SMART - Goal Setting: - Group verbal and written instruction through game format to discuss heart disease and the return to sexual intimacy. Provides group verbal and written material to discuss and apply goal setting through the application of the S.M.A.R.T. Method.   Other Matters of the Heart: - Provides group verbal, written materials and models to describe Stable Angina and Peripheral Artery. Includes  description of the disease process and treatment options available to the cardiac patient.   Exercise & Equipment Safety: - Individual verbal instruction and demonstration of equipment use and safety with use of the equipment.   Cardiac Rehab from 10/14/2017 in [facility] Cardiac and Pulmonary Rehab  Date  09/19/17  Educator  [facility]  Instruction Review Code  1- Verbalizes Understanding      Infection Prevention: - Provides verbal and written material to individual with discussion of infection control including proper hand washing and proper equipment cleaning during  exercise session.   Cardiac Rehab from 10/14/2017 in [facility] Cardiac and Pulmonary Rehab  Date  09/19/17  Educator  [facility]  Instruction Review Code  1- Verbalizes Understanding      Falls Prevention: - Provides verbal and written material to individual with discussion of falls prevention and safety.   Cardiac Rehab from 10/14/2017 in [facility] Cardiac and Pulmonary Rehab  Date  09/19/17  Educator  [facility]  Instruction Review Code  1- Verbalizes Understanding      Diabetes: - Individual verbal and written instruction to review signs/symptoms of diabetes, desired ranges of glucose level fasting, after meals and with exercise. Acknowledge that pre and post exercise glucose checks will be done for 3 sessions at entry of program.   Know Your Numbers and Risk Factors: -Group verbal and written instruction about important numbers in your health.  Discussion of what are risk factors and how they play a role in the disease process.  Review of Cholesterol, Blood Pressure, Diabetes, and BMI and the role they play in your overall health.   Cardiac Rehab from 10/14/2017 in [facility] Cardiac and Pulmonary Rehab  Date  10/02/17  Educator  SB  Instruction Review Code  1- Verbalizes Understanding      Sleep Hygiene: -Provides group verbal and written instruction about how sleep can affect your health.  Define sleep hygiene, discuss sleep cycles and impact of sleep habits. Review good sleep hygiene tips.    Other: -Provides group and verbal instruction on various topics (see comments)   Knowledge Questionnaire Score: Knowledge Questionnaire Score - 09/19/17 1146      Knowledge Questionnaire Score   Pre Score  19/26        Angina, Depresion, Exercise and Nutrition areas for further education       Core Components/Risk Factors/Patient Goals at Admission: Personal Goals and Risk Factors at Admission - 09/19/17 1415      Core Components/Risk Factors/Patient Goals on Admission    Weight Management  Yes;Weight  Maintenance    Intervention  Weight Management: Develop a combined nutrition and exercise program designed to reach desired caloric intake, while maintaining appropriate intake of nutrient and fiber, sodium and fats, and appropriate energy expenditure required for the weight goal.;Weight Management: Provide education and appropriate resources to help participant work on and attain dietary goals.    Admit Weight  200 lb (90.7 kg)    Expected Outcomes  Short Term: Continue to assess and modify interventions until short term weight is achieved;Long Term: Adherence to nutrition and physical activity/exercise program aimed toward attainment of established weight goal;Weight Maintenance: Understanding of the daily nutrition guidelines, which includes 25-35% calories from fat, 7% or less cal from saturated fats, less than 261m cholesterol, less than 1.5gm of sodium, & 5 or more servings of fruits and vegetables daily;Understanding recommendations for meals to include 15-35% energy as protein, 25-35% energy from fat, 35-60% energy from carbohydrates, less than 2076mof dietary cholesterol, 20-35 gm of  total fiber daily;Understanding of distribution of calorie intake throughout the day with the consumption of 4-5 meals/snacks    Hypertension  Yes    Intervention  Provide education on lifestyle modifcations including regular physical activity/exercise, weight management, moderate sodium restriction and increased consumption of fresh fruit, vegetables, and low fat dairy, alcohol moderation, and smoking cessation.;Monitor prescription use compliance.    Expected Outcomes  Short Term: Continued assessment and intervention until BP is < 140/5m HG in hypertensive participants. < 130/870mHG in hypertensive participants with diabetes, heart failure or chronic kidney disease.;Long Term: Maintenance of blood pressure at goal levels.    Lipids  Yes    Intervention  Provide education and support for participant on nutrition  & aerobic/resistive exercise along with prescribed medications to achieve LDL <7087mHDL >47m76m  Expected Outcomes  Short Term: Participant states understanding of desired cholesterol values and is compliant with medications prescribed. Participant is following exercise prescription and nutrition guidelines.;Long Term: Cholesterol controlled with medications as prescribed, with individualized exercise RX and with personalized nutrition plan. Value goals: LDL < 70mg1mL > 40 mg.       Core Components/Risk Factors/Patient Goals Review:    Core Components/Risk Factors/Patient Goals at Discharge (Final Review):    ITP Comments: ITP Comments    Row Name 09/19/17 1341 10/16/17 0537         ITP Comments  Med Review completed. Initial ITP created. [facility]  [facility] day review- Continue with ITP unless directed changes per Medical Director review.   New to program 6/3         Comments:

## 2017-10-16 NOTE — Progress Notes (Signed)
Daily Session Note  Patient Details  Name: Stuart Gentry MRN: [id number] Date of Birth: 05/16/1945 Referring Provider:     Cardiac Rehab from 09/19/2017 in [facility] Cardiac and Pulmonary Rehab  Referring Provider  [name]      Encounter Date: 10/16/2017  Check In: Session Check In - 10/16/17 0743      Check-In   Location  [facility]-Cardiac & Pulmonary Rehab    Staff Present  Justin Mend RCP,RRT,BSRT;[name], RN, BSN, CCRP;Jessica Luan Pulling, MA, RCEP, CCRP, Exercise Physiologist    Supervising physician immediately available to respond to emergencies  See telemetry face sheet for immediately available ER MD    Medication changes reported      No    Fall or balance concerns reported     No    Tobacco Cessation  No Change    Warm-up and Cool-down  Performed on first and last piece of equipment    Resistance Training Performed  Yes    VAD Patient?  No      Pain Assessment   Currently in Pain?  No/denies          Social History   Tobacco Use  Smoking Status Never Smoker  Smokeless Tobacco Never Used    Goals Met:  Independence with exercise equipment Exercise tolerated well No report of cardiac concerns or symptoms Strength training completed today  Goals Unmet:  Not Applicable  Comments: Pt able to follow exercise prescription today without complaint.  Will continue to monitor for progression.   Dr. [name] is Medical Director for [place] and [facility].

## 2017-10-18 DIAGNOSIS — Z951 Presence of aortocoronary bypass graft: Secondary | ICD-10-CM | POA: Diagnosis not present

## 2017-10-18 NOTE — Progress Notes (Signed)
Daily Session Note  Patient Details  Name: Stuart Gentry MRN: [id number] Date of Birth: April 04, 1946 Referring Provider:     Cardiac Rehab from 09/19/2017 in [facility] Cardiac and Pulmonary Rehab  Referring Provider  [name]      Encounter Date: 10/18/2017  Check In: Session Check In - 10/18/17 0826      Check-In   Location  [facility]-Cardiac & Pulmonary Rehab    Staff Present  Alberteen Sam, MA, RCEP, CCRP, Exercise Physiologist;Meredith Sherryll Burger, RN Vickki Hearing, BA, ACSM CEP, Exercise Physiologist    Supervising physician immediately available to respond to emergencies  See telemetry face sheet for immediately available ER MD    Medication changes reported      No    Fall or balance concerns reported     No    Warm-up and Cool-down  Performed on first and last piece of equipment    Resistance Training Performed  Yes    VAD Patient?  No      Pain Assessment   Currently in Pain?  No/denies    Multiple Pain Sites  No          Social History   Tobacco Use  Smoking Status Never Smoker  Smokeless Tobacco Never Used    Goals Met:  Independence with exercise equipment Exercise tolerated well No report of cardiac concerns or symptoms Strength training completed today  Goals Unmet:  Not Applicable  Comments: Pt able to follow exercise prescription today without complaint.  Will continue to monitor for progression.    Dr. [name] is Medical Director for [place] and [facility].

## 2017-10-21 ENCOUNTER — Encounter: Payer: Medicare Other | Admitting: *Deleted

## 2017-10-21 DIAGNOSIS — Z951 Presence of aortocoronary bypass graft: Secondary | ICD-10-CM

## 2017-10-21 NOTE — Progress Notes (Signed)
Daily Session Note  Patient Details  Name: Stuart Gentry MRN: [id number] Date of Birth: [birth year] Referring Provider:     Cardiac Rehab from 09/19/2017 in [facility] Cardiac and Pulmonary Rehab  Referring Provider  [name]      Encounter Date: 10/21/2017  Check In: Session Check In - 10/21/17 0748      Check-In   Location  [facility]-Cardiac & Pulmonary Rehab    Staff Present  Gerlene Burdock, RN, Moises Blood, BS, ACSM CEP, Exercise Physiologist;Yancey Pedley Luan Pulling, [place], RCEP, CCRP, Exercise Physiologist    Supervising physician immediately available to respond to emergencies  See telemetry face sheet for immediately available ER MD    Medication changes reported      No    Fall or balance concerns reported     No    Warm-up and Cool-down  Performed on first and last piece of equipment    Resistance Training Performed  Yes    VAD Patient?  No      Pain Assessment   Currently in Pain?  No/denies          Social History   Tobacco Use  Smoking Status Never Smoker  Smokeless Tobacco Never Used    Goals Met:  Independence with exercise equipment Exercise tolerated well No report of cardiac concerns or symptoms Strength training completed today  Goals Unmet:  Not Applicable  Comments: Pt able to follow exercise prescription today without complaint.  Will continue to monitor for progression.    Dr. [name] is Medical Director for [place] and [facility].

## 2017-10-22 ENCOUNTER — Other Ambulatory Visit: Payer: Medicare Other

## 2017-10-22 DIAGNOSIS — E038 Other specified hypothyroidism: Secondary | ICD-10-CM

## 2017-10-22 DIAGNOSIS — E039 Hypothyroidism, unspecified: Secondary | ICD-10-CM

## 2017-10-22 LAB — TSH: TSH: 8.49 mIU/L — ABNORMAL HIGH (ref 0.40–4.50)

## 2017-10-23 ENCOUNTER — Encounter: Payer: Medicare Other | Admitting: *Deleted

## 2017-10-23 DIAGNOSIS — Z951 Presence of aortocoronary bypass graft: Secondary | ICD-10-CM | POA: Diagnosis not present

## 2017-10-23 NOTE — Progress Notes (Signed)
Daily Session Note  Patient Details  Name: Stuart Gentry MRN: [id number] Date of Birth: 1946/04/29 Referring Provider:     Cardiac Rehab from 09/19/2017 in [facility] Cardiac and Pulmonary Rehab  Referring Provider  [name]      Encounter Date: 10/23/2017  Check In: Session Check In - 10/23/17 0754      Check-In   Location  [facility]-Cardiac & Pulmonary Rehab    Staff Present  [name], RN, BSN, CCRP;Aedin Jeansonne Luan Pulling, MA, RCEP, CCRP, Exercise Physiologist;Joseph Flavia Shipper    Supervising physician immediately available to respond to emergencies  See telemetry face sheet for immediately available ER MD    Medication changes reported      No    Fall or balance concerns reported     No    Warm-up and Cool-down  Performed on first and last piece of equipment    Resistance Training Performed  Yes    VAD Patient?  No    PAD/SET Patient?  No      Pain Assessment   Currently in Pain?  No/denies          Social History   Tobacco Use  Smoking Status Never Smoker  Smokeless Tobacco Never Used    Goals Met:  Independence with exercise equipment Exercise tolerated well No report of cardiac concerns or symptoms Strength training completed today  Goals Unmet:  Not Applicable  Comments: Pt able to follow exercise prescription today without complaint.  Will continue to monitor for progression.    Dr. [name] is Medical Director for [place] and [facility].

## 2017-10-24 ENCOUNTER — Encounter (INDEPENDENT_AMBULATORY_CARE_PROVIDER_SITE_OTHER): Payer: Self-pay

## 2017-10-24 ENCOUNTER — Other Ambulatory Visit: Payer: Self-pay | Admitting: Family Medicine

## 2017-10-24 DIAGNOSIS — E039 Hypothyroidism, unspecified: Secondary | ICD-10-CM

## 2017-10-24 DIAGNOSIS — E038 Other specified hypothyroidism: Secondary | ICD-10-CM

## 2017-10-24 MED ORDER — LEVOTHYROXINE SODIUM 50 MCG PO TABS: 50.0000 ug | ORAL_TABLET | Freq: Every day | ORAL | 1 refills | Status: DC

## 2017-10-25 DIAGNOSIS — Z951 Presence of aortocoronary bypass graft: Secondary | ICD-10-CM

## 2017-10-25 NOTE — Progress Notes (Signed)
Daily Session Note  Patient Details  Name: Stuart Gentry MRN: [id number] Date of Birth: Jun 22, 1945 Referring Provider:     Cardiac Rehab from 09/19/2017 in [facility] Cardiac and Pulmonary Rehab  Referring Provider  [name]      Encounter Date: 10/25/2017  Check In: Session Check In - 10/25/17 0823      Check-In   Location  [facility]-Cardiac & Pulmonary Rehab    Staff Present  Alberteen Sam, MA, RCEP, CCRP, Exercise Physiologist;Meredith Sherryll Burger, RN Vickki Hearing, BA, ACSM CEP, Exercise Physiologist    Supervising physician immediately available to respond to emergencies  See telemetry face sheet for immediately available ER MD    Medication changes reported      No    Fall or balance concerns reported     No    Warm-up and Cool-down  Performed on first and last piece of equipment    Resistance Training Performed  Yes    VAD Patient?  No    PAD/SET Patient?  No      Pain Assessment   Currently in Pain?  No/denies    Multiple Pain Sites  No          Social History   Tobacco Use  Smoking Status Never Smoker  Smokeless Tobacco Never Used    Goals Met:  Independence with exercise equipment Exercise tolerated well Personal goals reviewed No report of cardiac concerns or symptoms Strength training completed today  Goals Unmet:  Not Applicable  Comments: Pt able to follow exercise prescription today without complaint.  Will continue to monitor for progression. See ITP for goal review.   Dr. [name] is Medical Director for [place] and [facility].

## 2017-10-28 ENCOUNTER — Encounter: Payer: Medicare Other | Attending: Cardiovascular Disease | Admitting: *Deleted

## 2017-10-28 VITALS — Ht 71.0 in | Wt 201.9 lb

## 2017-10-28 DIAGNOSIS — Z951 Presence of aortocoronary bypass graft: Secondary | ICD-10-CM | POA: Diagnosis not present

## 2017-10-28 NOTE — Progress Notes (Signed)
Daily Session Note  Patient Details  Name: Stuart Gentry MRN: [id number] Date of Birth: April 28, 1946 Referring Provider:     Cardiac Rehab from 09/19/2017 in [facility] Cardiac and Pulmonary Rehab  Referring Provider  [name]      Encounter Date: 10/28/2017  Check In: Session Check In - 10/28/17 0756      Check-In   Location  [facility]-Cardiac & Pulmonary Rehab    Staff Present  [name], RN, BSN, CCRP;Mandi [place], BS, PEC;Hatem Cull [place], [place], [place], [place], Exercise Physiologist    Supervising physician immediately available to respond to emergencies  See telemetry face sheet for immediately available ER MD    Medication changes reported      No    Fall or balance concerns reported     No    Warm-up and Cool-down  Performed on first and last piece of equipment    Resistance Training Performed  Yes    VAD Patient?  No    PAD/SET Patient?  No      Pain Assessment   Currently in Pain?  No/denies          Social History   Tobacco Use  Smoking Status Never Smoker  Smokeless Tobacco Never Used    Goals Met:  Independence with exercise equipment Exercise tolerated well No report of cardiac concerns or symptoms Strength training completed today  Goals Unmet:  Not Applicable  Comments: Pt able to follow exercise prescription today without complaint.  Will continue to monitor for progression. [place] Name 09/19/17 1408 10/28/17 0959       6 Minute Walk   Phase  -  Discharge    Distance  1320 feet  2000 feet    Distance % Change  -  51.5 %    Distance Feet Change  -  780 ft    Walk Time  6 minutes  6 minutes    # of Rest Breaks  0  0    MPH  2.5  3.79    METS  3.18  4.11    RPE  12  13    Perceived Dyspnea   0  -    VO2 Peak  11.14  14.41    Symptoms  No  No    Resting HR  51 bpm  59 bpm    Resting BP  136/56  126/70    Resting Oxygen Saturation   99 %  -    Exercise Oxygen Saturation  during 6 min walk  96 %  -    Max Ex. HR  106 bpm  105 bpm    Max  Ex. BP  158/68  136/74    2 Minute Post BP  124/74  -         Dr. [name] is Medical Director for [place] and [facility].

## 2017-10-30 DIAGNOSIS — Z951 Presence of aortocoronary bypass graft: Secondary | ICD-10-CM | POA: Diagnosis not present

## 2017-10-30 NOTE — Patient Instructions (Signed)
Discharge Patient Instructions  Patient Details  Name: Stuart Gentry MRN: [id number] Date of Birth: 09-25-1945 Referring Provider:  Josue Hector, MD   Number of Visits: 31  Reason for Discharge:  Patient reached a stable level of exercise. Patient independent in their exercise. Patient has met program and personal goals.  Smoking History:  Social History   Tobacco Use  Smoking Status Never Smoker  Smokeless Tobacco Never Used    Diagnosis:  S/P CABG x 6  Initial Exercise Prescription: Initial Exercise Prescription - 09/19/17 1400      Date of Initial Exercise RX and Referring Provider   Date  09/19/17    Referring Provider  [name]      Treadmill   MPH  2.5    Grade  0.5    Minutes  15    METs  3      Recumbant Bike   Level  3    RPM  60    Watts  30    Minutes  15    METs  3      NuStep   Level  3    SPM  80    Minutes  15    METs  3      Prescription Details   Frequency (times per week)  3    Duration  Progress to 45 minutes of aerobic exercise without signs/symptoms of physical distress      Intensity   THRR 40-80% of Max Heartrate  90-129    Ratings of Perceived Exertion  11-13    Perceived Dyspnea  0-4      Resistance Training   Training Prescription  Yes    Weight  3 lb    Reps  10-15       Discharge Exercise Prescription (Final Exercise Prescription Changes): Exercise Prescription Changes - 10/21/17 1300      Response to Exercise   Blood Pressure (Admit)  134/66    Blood Pressure (Exercise)  142/82    Blood Pressure (Exit)  126/52    Heart Rate (Admit)  68 bpm    Heart Rate (Exercise)  86 bpm    Heart Rate (Exit)  51 bpm    Rating of Perceived Exertion (Exercise)  13    Symptoms  none    Duration  Continue with 45 min of aerobic exercise without signs/symptoms of physical distress.    Intensity  THRR unchanged      Progression   Progression  Continue to progress workloads to maintain intensity without signs/symptoms of  physical distress.    Average METs  4.72      Resistance Training   Training Prescription  Yes    Weight  4 lbs    Reps  10-15      Interval Training   Interval Training  No      Treadmill   MPH  3    Grade  2    Minutes  15    METs  4.12      Recumbant Bike   Level  6    Watts  68    Minutes  15    METs  4.45      NuStep   Level  5    Minutes  15    METs  5.6      Home Exercise Plan   Plans to continue exercise at  Home (comment) treadmill    Frequency  Add 2 additional days to program exercise  sessions.    Initial Home Exercises Provided  10/14/17       Functional Capacity: 6 Minute Walk    Row Name 09/19/17 1408 10/28/17 0959       6 Minute Walk   Phase  -  Discharge    Distance  1320 feet  2000 feet    Distance % Change  -  51.5 %    Distance Feet Change  -  780 ft    Walk Time  6 minutes  6 minutes    # of Rest Breaks  0  0    MPH  2.5  3.79    METS  3.18  4.11    RPE  12  13    Perceived Dyspnea   0  -    VO2 Peak  11.14  14.41    Symptoms  No  No    Resting HR  51 bpm  59 bpm    Resting BP  136/56  126/70    Resting Oxygen Saturation   99 %  -    Exercise Oxygen Saturation  during 6 min walk  96 %  -    Max Ex. HR  106 bpm  105 bpm    Max Ex. BP  158/68  136/74    2 Minute Post BP  124/74  -       Quality of Life: Quality of Life - 09/19/17 1146      Quality of Life Scores   Health/Function Pre  0.23 %    Socioeconomic Pre  28.33 %    Psych/Spiritual Pre  27.33 %    Family Pre  28.8 %    GLOBAL Pre  27.73 %       Personal Goals: Goals established at orientation with interventions provided to work toward goal. Personal Goals and Risk Factors at Admission - 09/19/17 1415      Core Components/Risk Factors/Patient Goals on Admission    Weight Management  Yes;Weight Maintenance    Intervention  Weight Management: Develop a combined nutrition and exercise program designed to reach desired caloric intake, while maintaining appropriate  intake of nutrient and fiber, sodium and fats, and appropriate energy expenditure required for the weight goal.;Weight Management: Provide education and appropriate resources to help participant work on and attain dietary goals.    Admit Weight  200 lb (90.7 kg)    Expected Outcomes  Short Term: Continue to assess and modify interventions until short term weight is achieved;Long Term: Adherence to nutrition and physical activity/exercise program aimed toward attainment of established weight goal;Weight Maintenance: Understanding of the daily nutrition guidelines, which includes 25-35% calories from fat, 7% or less cal from saturated fats, less than 255m cholesterol, less than 1.5gm of sodium, & 5 or more servings of fruits and vegetables daily;Understanding recommendations for meals to include 15-35% energy as protein, 25-35% energy from fat, 35-60% energy from carbohydrates, less than 2030mof dietary cholesterol, 20-35 gm of total fiber daily;Understanding of distribution of calorie intake throughout the day with the consumption of 4-5 meals/snacks    Hypertension  Yes    Intervention  Provide education on lifestyle modifcations including regular physical activity/exercise, weight management, moderate sodium restriction and increased consumption of fresh fruit, vegetables, and low fat dairy, alcohol moderation, and smoking cessation.;Monitor prescription use compliance.    Expected Outcomes  Short Term: Continued assessment and intervention until BP is < 140/9073mG in hypertensive participants. < 130/80m25m in hypertensive participants with diabetes, heart  failure or chronic kidney disease.;Long Term: Maintenance of blood pressure at goal levels.    Lipids  Yes    Intervention  Provide education and support for participant on nutrition & aerobic/resistive exercise along with prescribed medications to achieve LDL <69m, HDL >458m    Expected Outcomes  Short Term: Participant states understanding of  desired cholesterol values and is compliant with medications prescribed. Participant is following exercise prescription and nutrition guidelines.;Long Term: Cholesterol controlled with medications as prescribed, with individualized exercise RX and with personalized nutrition plan. Value goals: LDL < 7023mHDL > 40 mg.        Personal Goals Discharge: Goals and Risk Factor Review - 10/25/17 0827      Core Components/Risk Factors/Patient Goals Review   Personal Goals Review  Weight Management/Obesity;Hypertension;Lipids    Review  JohQuencys been doing well in rehab. His weight has remained stable.  His blood pressures have been good and he checks them at home. He checks on his off days.  He is doing well on his medications.  He has uped his thyroid medication recently.     Expected Outcomes  Short: Continue to work on weight loss.  Long: Continue to monitor risk factors.        Exercise Goals and Review: Exercise Goals    Row Name 09/19/17 1407             Exercise Goals   Increase Physical Activity  Yes       Intervention  Provide advice, education, support and counseling about physical activity/exercise needs.;Develop an individualized exercise prescription for aerobic and resistive training based on initial evaluation findings, risk stratification, comorbidities and participant's personal goals.       Expected Outcomes  Short Term: Attend rehab on a regular basis to increase amount of physical activity.;Long Term: Add in home exercise to make exercise part of routine and to increase amount of physical activity.;Long Term: Exercising regularly at least 3-5 days a week.       Increase Strength and Stamina  Yes       Intervention  Provide advice, education, support and counseling about physical activity/exercise needs.;Develop an individualized exercise prescription for aerobic and resistive training based on initial evaluation findings, risk stratification, comorbidities and participant's  personal goals.       Expected Outcomes  Short Term: Increase workloads from initial exercise prescription for resistance, speed, and METs.;Short Term: Perform resistance training exercises routinely during rehab and add in resistance training at home;Long Term: Improve cardiorespiratory fitness, muscular endurance and strength as measured by increased METs and functional capacity (6MWT)       Able to understand and use rate of perceived exertion (RPE) scale  Yes       Intervention  Provide education and explanation on how to use RPE scale       Expected Outcomes  Short Term: Able to use RPE daily in rehab to express subjective intensity level;Long Term:  Able to use RPE to guide intensity level when exercising independently       Able to understand and use Dyspnea scale  Yes       Intervention  Provide education and explanation on how to use Dyspnea scale       Expected Outcomes  Short Term: Able to use Dyspnea scale daily in rehab to express subjective sense of shortness of breath during exertion;Long Term: Able to use Dyspnea scale to guide intensity level when exercising independently       Knowledge  and understanding of Target Heart Rate Range (THRR)  Yes       Intervention  Provide education and explanation of THRR including how the numbers were predicted and where they are located for reference       Expected Outcomes  Short Term: Able to state/look up THRR;Short Term: Able to use daily as guideline for intensity in rehab;Long Term: Able to use THRR to govern intensity when exercising independently       Able to check pulse independently  Yes       Intervention  Provide education and demonstration on how to check pulse in carotid and radial arteries.;Review the importance of being able to check your own pulse for safety during independent exercise       Expected Outcomes  Short Term: Able to explain why pulse checking is important during independent exercise;Long Term: Able to check pulse  independently and accurately       Understanding of Exercise Prescription  Yes       Intervention  Provide education, explanation, and written materials on patient's individual exercise prescription       Expected Outcomes  Short Term: Able to explain program exercise prescription;Long Term: Able to explain home exercise prescription to exercise independently          Nutrition & Weight - Outcomes: Pre Biometrics - 09/19/17 1406      Pre Biometrics   Height  _0  (1.803 m)    Weight  200 lb 11.2 oz (91 kg)    Waist Circumference  41 inches    Hip Circumference  42 inches    Waist to Hip Ratio  0.98 %    BMI (Calculated)  28    Single Leg Stand  8.07 seconds      Post Biometrics - 10/28/17 1001       Post  Biometrics   Height  _1  (1.803 m)    Weight  201 lb 14.4 oz (91.6 kg)    Waist Circumference  41.5 inches    Hip Circumference  40.5 inches    Waist to Hip Ratio  1.02 %    BMI (Calculated)  28.17    Single Leg Stand  11.69 seconds       Nutrition: Nutrition Therapy & Goals - 10/14/17 1135      Nutrition Therapy   Diet  Instructed patient accompanied by his wife on a meal plan based on heart healthy dietary principles    Drug/Food Interactions  Statins/Certain Fruits    Protein (specify units)  8    Fiber  30 grams    Whole Grain Foods  3 servings    Saturated Fats  14 max. grams    Fruits and Vegetables  5 servings/day    Sodium  1500 grams      Personal Nutrition Goals   Nutrition Goal  Continue to read labels for saturated fat and sodium.    Personal Goal #2  Work to increase fruit/vegetable intake.    Personal Goal #3  Try very low sodium tuna; Also, try a variety of Ms. DASH seasonings- lemon pepper, garlic herb or Ms. DASH packets of taco seasoning, pot roast or chili seasonings.    Personal Goal #4  Increase water intake to 6-8 cups daily.    Additional Goals?  No      Intervention Plan   Intervention  Prescribe, educate and counsel regarding  individualized specific dietary modifications aiming towards targeted core components such as weight, hypertension,  lipid management, diabetes, heart failure and other comorbidities.;Nutrition handout(s) given to patient.    Expected Outcomes  Short Term Goal: Understand basic principles of dietary content, such as calories, fat, sodium, cholesterol and nutrients.;Short Term Goal: A plan has been developed with personal nutrition goals set during dietitian appointment.;Long Term Goal: Adherence to prescribed nutrition plan.       Nutrition Discharge: Nutrition Assessments - 09/19/17 1145      MEDFICTS Scores   Pre Score  38       Education Questionnaire Score: Knowledge Questionnaire Score - 09/19/17 1146      Knowledge Questionnaire Score   Pre Score  19/26        Angina, Depresion, Exercise and Nutrition areas for further education       Goals reviewed with patient; copy given to patient.

## 2017-10-30 NOTE — Progress Notes (Signed)
Daily Session Note  Patient Details  Name: Stuart Gentry MRN: [id number] Date of Birth: January 24, 1946 Referring Provider:     Cardiac Rehab from 09/19/2017 in [facility] Cardiac and Pulmonary Rehab  Referring Provider  [name]      Encounter Date: 10/30/2017  Check In: Session Check In - 10/30/17 0736      Check-In   Location  [facility]-Cardiac & Pulmonary Rehab    Staff Present  Justin Mend RCP,RRT,BSRT;[name], RN, BSN, CCRP;Jessica Luan Pulling, MA, RCEP, CCRP, Exercise Physiologist    Supervising physician immediately available to respond to emergencies  See telemetry face sheet for immediately available ER MD    Medication changes reported      No    Fall or balance concerns reported     No    Warm-up and Cool-down  Performed on first and last piece of equipment    Resistance Training Performed  Yes    VAD Patient?  No      Pain Assessment   Currently in Pain?  No/denies          Social History   Tobacco Use  Smoking Status Never Smoker  Smokeless Tobacco Never Used    Goals Met:  Independence with exercise equipment Exercise tolerated well No report of cardiac concerns or symptoms Strength training completed today  Goals Unmet:  Not Applicable  Comments: Pt able to follow exercise prescription today without complaint.  Will continue to monitor for progression.   Dr. [name] is Medical Director for [place] and [facility].

## 2017-11-01 DIAGNOSIS — Z951 Presence of aortocoronary bypass graft: Secondary | ICD-10-CM | POA: Diagnosis not present

## 2017-11-01 NOTE — Progress Notes (Signed)
Daily Session Note  Patient Details  Name: Stuart Gentry MRN: [id number] Date of Birth: 02/09/1946 Referring Provider:     Cardiac Rehab from 09/19/2017 in [facility] Cardiac and Pulmonary Rehab  Referring Provider  [name]      Encounter Date: 11/01/2017  Check In: Session Check In - 11/01/17 0736      Check-In   Location  [facility]-Cardiac & Pulmonary Rehab    Staff Present  Justin Mend RCP,RRT,BSRT;[name], RN, BSN, CCRP;Jessica Luan Pulling, MA, RCEP, CCRP, Exercise Physiologist    Supervising physician immediately available to respond to emergencies  See telemetry face sheet for immediately available ER MD    Medication changes reported      No    Fall or balance concerns reported     No    Warm-up and Cool-down  Performed on first and last piece of equipment    Resistance Training Performed  Yes    VAD Patient?  No      Pain Assessment   Currently in Pain?  No/denies          Social History   Tobacco Use  Smoking Status Never Smoker  Smokeless Tobacco Never Used    Goals Met:  Independence with exercise equipment Exercise tolerated well No report of cardiac concerns or symptoms Strength training completed today  Goals Unmet:  Not Applicable  Comments: Pt able to follow exercise prescription today without complaint.  Will continue to monitor for progression.   Dr. [name] is Medical Director for [place] and [facility].

## 2017-11-04 ENCOUNTER — Encounter: Payer: Medicare Other | Admitting: *Deleted

## 2017-11-04 DIAGNOSIS — Z951 Presence of aortocoronary bypass graft: Secondary | ICD-10-CM

## 2017-11-04 NOTE — Progress Notes (Signed)
Daily Session Note  Patient Details  Name: Stuart Gentry MRN: [id number] Date of Birth: 1946/04/07 Referring Provider:     Cardiac Rehab from 09/19/2017 in [facility] Cardiac and Pulmonary Rehab  Referring Provider  [name]      Encounter Date: 11/04/2017  Check In: Session Check In - 11/04/17 0746      Check-In   Location  [facility]-Cardiac & Pulmonary Rehab    Staff Present  Joellyn Rued, BS, PEC;Meredith Roots, RN Moises Blood, BS, ACSM CEP, Exercise Physiologist    Supervising physician immediately available to respond to emergencies  See telemetry face sheet for immediately available ER MD    Medication changes reported      No    Fall or balance concerns reported     No    Tobacco Cessation  No Change    Warm-up and Cool-down  Performed on first and last piece of equipment    Resistance Training Performed  Yes    VAD Patient?  No    PAD/SET Patient?  No      Pain Assessment   Currently in Pain?  No/denies          Social History   Tobacco Use  Smoking Status Never Smoker  Smokeless Tobacco Never Used    Goals Met:  Independence with exercise equipment Exercise tolerated well No report of cardiac concerns or symptoms Strength training completed today  Goals Unmet:  Not Applicable  Comments: Pt able to follow exercise prescription today without complaint.  Will continue to monitor for progression.    Dr. [name] is Medical Director for [place] [facility].

## 2017-11-06 DIAGNOSIS — Z951 Presence of aortocoronary bypass graft: Secondary | ICD-10-CM

## 2017-11-06 NOTE — Progress Notes (Signed)
Daily Session Note  Patient Details  Name: Stuart Gentry MRN: [id number] Date of Birth: 12-11-1945 Referring Provider:     Cardiac Rehab from 09/19/2017 in [facility] Cardiac and Pulmonary Rehab  Referring Provider  [name]      Encounter Date: 11/06/2017  Check In: Session Check In - 11/06/17 0744      Check-In   Location  [facility]-Cardiac & Pulmonary Rehab    Staff Present  Justin Mend RCP,RRT,BSRT;[name], RN, BSN, CCRP;Nana Addai, RN BSN    Supervising physician immediately available to respond to emergencies  See telemetry face sheet for immediately available ER MD    Medication changes reported      No    Fall or balance concerns reported     No    Warm-up and Cool-down  Performed on first and last piece of equipment    Resistance Training Performed  Yes    VAD Patient?  No      Pain Assessment   Currently in Pain?  No/denies          Social History   Tobacco Use  Smoking Status Never Smoker  Smokeless Tobacco Never Used    Goals Met:  Independence with exercise equipment Exercise tolerated well No report of cardiac concerns or symptoms Strength training completed today  Goals Unmet:  Not Applicable  Comments: Pt able to follow exercise prescription today without complaint.  Will continue to monitor for progression.   Dr. [name] is Medical Director for [place] and [facility].

## 2017-11-08 ENCOUNTER — Encounter: Payer: Medicare Other | Admitting: *Deleted

## 2017-11-08 DIAGNOSIS — Z951 Presence of aortocoronary bypass graft: Secondary | ICD-10-CM | POA: Diagnosis not present

## 2017-11-08 NOTE — Progress Notes (Signed)
Discharge Progress Report  Patient Details  Name: Stuart Gentry MRN: [id number] Date of Birth: Aug 14, 1945 Referring Provider:     Cardiac Rehab from 09/19/2017 in [facility]  Referring Provider  [name]       Number of Visits: 3  Reason for Discharge:  Patient reached a stable level of exercise. Patient independent in their exercise. Patient has met program and personal goals.  Smoking History:  Social History   Tobacco Use  Smoking Status Never Smoker  Smokeless Tobacco Never Used    Diagnosis:  S/P CABG x 6  ADL UCSD:   Initial Exercise Prescription: Initial Exercise Prescription - 09/19/17 1400      Date of Initial Exercise RX and Referring Provider   Date  09/19/17    Referring Provider  [name]      Treadmill   MPH  2.5    Grade  0.5    Minutes  15    METs  3      Recumbant Bike   Level  3    RPM  60    Watts  30    Minutes  15    METs  3      NuStep   Level  3    SPM  80    Minutes  15    METs  3      Prescription Details   Frequency (times per week)  3    Duration  Progress to 45 minutes of aerobic exercise without signs/symptoms of physical distress      Intensity   THRR 40-80% of Max Heartrate  90-129    Ratings of Perceived Exertion  11-13    Perceived Dyspnea  0-4      Resistance Training   Training Prescription  Yes    Weight  3 lb    Reps  10-15       Discharge Exercise Prescription (Final Exercise Prescription Changes): Exercise Prescription Changes - 10/21/17 1300      Response to Exercise   Blood Pressure (Admit)  134/66    Blood Pressure (Exercise)  142/82    Blood Pressure (Exit)  126/52    Heart Rate (Admit)  68 bpm    Heart Rate (Exercise)  86 bpm    Heart Rate (Exit)  51 bpm    Rating of Perceived Exertion (Exercise)  13    Symptoms  none    Duration  Continue with 45 min of aerobic exercise without signs/symptoms of physical distress.    Intensity  THRR unchanged      Progression   Progression  Continue to progress workloads to maintain intensity without signs/symptoms of physical distress.    Average METs  4.72      Resistance Training   Training Prescription  Yes    Weight  4 lbs    Reps  10-15      Interval Training   Interval Training  No      Treadmill   MPH  3    Grade  2    Minutes  15    METs  4.12      Recumbant Bike   Level  6    Watts  68    Minutes  15    METs  4.45      NuStep   Level  5    Minutes  15    METs  5.6      Home Exercise Plan  Plans to continue exercise at  Home (comment) treadmill    Frequency  Add 2 additional days to program exercise sessions.    Initial Home Exercises Provided  10/14/17       Functional Capacity: 6 Minute Walk    Row Name 09/19/17 1408 10/28/17 0959       6 Minute Walk   Phase  -  Discharge    Distance  1320 feet  2000 feet    Distance % Change  -  51.5 %    Distance Feet Change  -  780 ft    Walk Time  6 minutes  6 minutes    # of Rest Breaks  0  0    MPH  2.5  3.79    METS  3.18  4.11    RPE  12  13    Perceived Dyspnea   0  -    VO2 Peak  11.14  14.41    Symptoms  No  No    Resting HR  51 bpm  59 bpm    Resting BP  136/56  126/70    Resting Oxygen Saturation   99 %  -    Exercise Oxygen Saturation  during 6 min walk  96 %  -    Max Ex. HR  106 bpm  105 bpm    Max Ex. BP  158/68  136/74    2 Minute Post BP  124/74  -       Psychological, QOL, Others - Outcomes: PHQ 2/9: Depression screen [facility] 2/9 11/01/2017 09/19/2017 06/14/2017 06/14/2017 10/05/2013  Decreased Interest 0 0 0 0 0  Down, Depressed, Hopeless 0 0 0 0 0  PHQ - 2 Score 0 0 0 0 0  Altered sleeping 1 0 - - -  Tired, decreased energy 1 1 - - -  Change in appetite 0 0 - - -  Feeling bad or failure about yourself  0 1 - - -  Trouble concentrating 0 0 - - -  Moving slowly or fidgety/restless 0 0 - - -  Suicidal thoughts 0 0 - - -  PHQ-9 Score 2 2 - - -  Difficult doing work/chores Not difficult at all Not difficult at  all - - -    Quality of Life: Quality of Life - 11/01/17 0820      Quality of Life   Select  Quality of Life      Quality of Life Scores   Health/Function Pre  23 %    Health/Function Post  27.2 %    Health/Function % Change  18.26 %    Socioeconomic Pre  28.33 %    Socioeconomic Post  27.5 %    Socioeconomic % Change   -2.93 %    Psych/Spiritual Pre  27.33 %    Psych/Spiritual Post  27.21 %    Psych/Spiritual % Change  -0.44 %    Family Pre  28.8 %    Family Post  27.6 %    Family % Change  -4.17 %    GLOBAL Pre  27.73 %    GLOBAL Post  27.32 %    GLOBAL % Change  -1.48 %       Personal Goals: Goals established at orientation with interventions provided to work toward goal. Personal Goals and Risk Factors at Admission - 09/19/17 1415      Core Components/Risk Factors/Patient Goals on Admission    Weight Management  Yes;Weight Maintenance  Intervention  Weight Management: Develop a combined nutrition and exercise program designed to reach desired caloric intake, while maintaining appropriate intake of nutrient and fiber, sodium and fats, and appropriate energy expenditure required for the weight goal.;Weight Management: Provide education and appropriate resources to help participant work on and attain dietary goals.    Admit Weight  200 lb (90.7 kg)    Expected Outcomes  Short Term: Continue to assess and modify interventions until short term weight is achieved;Long Term: Adherence to nutrition and physical activity/exercise program aimed toward attainment of established weight goal;Weight Maintenance: Understanding of the daily nutrition guidelines, which includes 25-35% calories from fat, 7% or less cal from saturated fats, less than '200mg'$  cholesterol, less than 1.5gm of sodium, & 5 or more servings of fruits and vegetables daily;Understanding recommendations for meals to include 15-35% energy as protein, 25-35% energy from fat, 35-60% energy from carbohydrates, less than  '200mg'$  of dietary cholesterol, 20-35 gm of total fiber daily;Understanding of distribution of calorie intake throughout the day with the consumption of 4-5 meals/snacks    Hypertension  Yes    Intervention  Provide education on lifestyle modifcations including regular physical activity/exercise, weight management, moderate sodium restriction and increased consumption of fresh fruit, vegetables, and low fat dairy, alcohol moderation, and smoking cessation.;Monitor prescription use compliance.    Expected Outcomes  Short Term: Continued assessment and intervention until BP is < 140/39m HG in hypertensive participants. < 130/861mHG in hypertensive participants with diabetes, heart failure or chronic kidney disease.;Long Term: Maintenance of blood pressure at goal levels.    Lipids  Yes    Intervention  Provide education and support for participant on nutrition & aerobic/resistive exercise along with prescribed medications to achieve LDL '70mg'$ , HDL >'40mg'$ .    Expected Outcomes  Short Term: Participant states understanding of desired cholesterol values and is compliant with medications prescribed. Participant is following exercise prescription and nutrition guidelines.;Long Term: Cholesterol controlled with medications as prescribed, with individualized exercise RX and with personalized nutrition plan. Value goals: LDL < '70mg'$ , HDL > 40 mg.        Personal Goals Discharge: Goals and Risk Factor Review    Row Name 10/25/17 0827             Core Components/Risk Factors/Patient Goals Review   Personal Goals Review  Weight Management/Obesity;Hypertension;Lipids       Review  JoDakarias been doing well in rehab. His weight has remained stable.  His blood pressures have been good and he checks them at home. He checks on his off days.  He is doing well on his medications.  He has uped his thyroid medication recently.        Expected Outcomes  Short: Continue to work on weight loss.  Long: Continue to monitor  risk factors.           Exercise Goals and Review: Exercise Goals    Row Name 09/19/17 1407             Exercise Goals   Increase Physical Activity  Yes       Intervention  Provide advice, education, support and counseling about physical activity/exercise needs.;Develop an individualized exercise prescription for aerobic and resistive training based on initial evaluation findings, risk stratification, comorbidities and participant's personal goals.       Expected Outcomes  Short Term: Attend rehab on a regular basis to increase amount of physical activity.;Long Term: Add in home exercise to make exercise part of routine and to increase  amount of physical activity.;Long Term: Exercising regularly at least 3-5 days a week.       Increase Strength and Stamina  Yes       Intervention  Provide advice, education, support and counseling about physical activity/exercise needs.;Develop an individualized exercise prescription for aerobic and resistive training based on initial evaluation findings, risk stratification, comorbidities and participant's personal goals.       Expected Outcomes  Short Term: Increase workloads from initial exercise prescription for resistance, speed, and METs.;Short Term: Perform resistance training exercises routinely during rehab and add in resistance training at home;Long Term: Improve cardiorespiratory fitness, muscular endurance and strength as measured by increased METs and functional capacity (6MWT)       Able to understand and use rate of perceived exertion (RPE) scale  Yes       Intervention  Provide education and explanation on how to use RPE scale       Expected Outcomes  Short Term: Able to use RPE daily in rehab to express subjective intensity level;Long Term:  Able to use RPE to guide intensity level when exercising independently       Able to understand and use Dyspnea scale  Yes       Intervention  Provide education and explanation on how to use Dyspnea scale        Expected Outcomes  Short Term: Able to use Dyspnea scale daily in rehab to express subjective sense of shortness of breath during exertion;Long Term: Able to use Dyspnea scale to guide intensity level when exercising independently       Knowledge and understanding of Target Heart Rate Range (THRR)  Yes       Intervention  Provide education and explanation of THRR including how the numbers were predicted and where they are located for reference       Expected Outcomes  Short Term: Able to state/look up THRR;Short Term: Able to use daily as guideline for intensity in rehab;Long Term: Able to use THRR to govern intensity when exercising independently       Able to check pulse independently  Yes       Intervention  Provide education and demonstration on how to check pulse in carotid and radial arteries.;Review the importance of being able to check your own pulse for safety during independent exercise       Expected Outcomes  Short Term: Able to explain why pulse checking is important during independent exercise;Long Term: Able to check pulse independently and accurately       Understanding of Exercise Prescription  Yes       Intervention  Provide education, explanation, and written materials on patient's individual exercise prescription       Expected Outcomes  Short Term: Able to explain program exercise prescription;Long Term: Able to explain home exercise prescription to exercise independently          Nutrition & Weight - Outcomes: Pre Biometrics - 09/19/17 1406      Pre Biometrics   Height  '5\' 11"'$  (1.803 m)    Weight  200 lb 11.2 oz (91 kg)    Waist Circumference  41 inches    Hip Circumference  42 inches    Waist to Hip Ratio  0.98 %    BMI (Calculated)  28    Single Leg Stand  8.07 seconds      Post Biometrics - 10/28/17 1001       Post  Biometrics   Height  '5\' 11"'$  (1.803  m)    Weight  201 lb 14.4 oz (91.6 kg)    Waist Circumference  41.5 inches    Hip Circumference  40.5  inches    Waist to Hip Ratio  1.02 %    BMI (Calculated)  28.17    Single Leg Stand  11.69 seconds       Nutrition: Nutrition Therapy & Goals - 10/14/17 1135      Nutrition Therapy   Diet  Instructed patient accompanied by his wife on a meal plan based on heart healthy dietary principles    Drug/Food Interactions  Statins/Certain Fruits    Protein (specify units)  8    Fiber  30 grams    Whole Grain Foods  3 servings    Saturated Fats  14 max. grams    Fruits and Vegetables  5 servings/day    Sodium  1500 grams      Personal Nutrition Goals   Nutrition Goal  Continue to read labels for saturated fat and sodium.    Personal Goal #2  Work to increase fruit/vegetable intake.    Personal Goal #3  Try very low sodium tuna; Also, try a variety of Ms. DASH seasonings- lemon pepper, garlic herb or Ms. DASH packets of taco seasoning, pot roast or chili seasonings.    Personal Goal #4  Increase water intake to 6-8 cups daily.    Additional Goals?  No      Intervention Plan   Intervention  Prescribe, educate and counsel regarding individualized specific dietary modifications aiming towards targeted core components such as weight, hypertension, lipid management, diabetes, heart failure and other comorbidities.;Nutrition handout(s) given to patient.    Expected Outcomes  Short Term Goal: Understand basic principles of dietary content, such as calories, fat, sodium, cholesterol and nutrients.;Short Term Goal: A plan has been developed with personal nutrition goals set during dietitian appointment.;Long Term Goal: Adherence to prescribed nutrition plan.       Nutrition Discharge: Nutrition Assessments - 11/01/17 0819      MEDFICTS Scores   Pre Score  38    Post Score  41    Score Difference  3       Education Questionnaire Score: Knowledge Questionnaire Score - 11/01/17 0819      Knowledge Questionnaire Score   Pre Score  19/26        Angina, Depresion, Exercise and Nutrition areas  for further education    Post Score  25/28 reviewed test with pt today       Goals reviewed with patient; copy given to patient.

## 2017-11-08 NOTE — Progress Notes (Signed)
Daily Session Note  Patient Details  Name: Stuart Gentry MRN: [id number] Date of Birth: 10/25/1945 Referring Provider:     Cardiac Rehab from 09/19/2017 in [facility]  Referring Provider  [name]      Encounter Date: 11/08/2017  Check In: Session Check In - 11/08/17 0742      Check-In   Location  [facility] & Pul[facility] Re[facility]    Staff Present  Susanne Bice, RN, BSN, CCRP;Meredith Craven, RN BSN;Amanda Sommer, BA, ACSM CEP, Exercise Physiologist    Supervising physician immediately available to respond to emergencies  See telemetry face sheet for immediately available ER MD    Medication changes reported      No    Fall or balance concerns reported     No    Tobacco Cessation  No Change    Warm-up and Cool-down  Performed on first and last piece of equipment    Resistance Training Performed  Yes    VAD Patient?  No      Pain Assessment   Currently in Pain?  No/denies          Social History   Tobacco Use  Smoking Status Never Smoker  Smokeless Tobacco Never Used    Goals Met:  Independence with exercise equipment Exercise tolerated well No report of cardiac concerns or symptoms Strength training completed today  Goals Unmet:  Not Applicable  Comments:  [name] graduated today from  rehab with 36 sessions completed.  Details of the patient's exercise prescription and what He needs to do in order to continue the prescription and progress were discussed with patient.  Patient was given a copy of prescription and goals.  Patient verbalized understanding.  [name] plans to continue to exercise by walking at home.    Dr. Mark Miller is Medical Director for [facility] and [facility]. 

## 2017-11-08 NOTE — Progress Notes (Signed)
Cardiac Individual Treatment Plan  Patient Details  Name: Stuart Gentry MRN: [id number] Date of Birth: [birth year] Referring Provider:     Cardiac Rehab from 09/19/2017 in [facility] Cardiac and Pulmonary Rehab  Referring Provider  [name]      Initial Encounter Date:    Cardiac Rehab from 09/19/2017 in [facility] Cardiac and Pulmonary Rehab  Date  09/19/17      Visit Diagnosis: S/P CABG x 6  Patient's Home Medications on Admission:  Current Outpatient Medications:  .  acetaminophen (TYLENOL) 325 MG tablet, Take 2 tablets (650 mg total) by mouth every 6 (six) hours as needed for mild pain., Disp: , Rfl:  .  amiodarone (PACERONE) 200 MG tablet, Take 1 tablet (200 mg total) by mouth daily. (Patient not taking: Reported on 09/19/2017), Disp: 30 tablet, Rfl: 6 .  aspirin EC 81 MG tablet, Take 1 tablet (81 mg total) by mouth daily., Disp: , Rfl:  .  atorvastatin (LIPITOR) 80 MG tablet, Take 1 tablet (80 mg total) by mouth daily at 6 PM., Disp: 30 tablet, Rfl: 3 .  esomeprazole (NEXIUM) 20 MG capsule, Take 20 mg by mouth every morning. , Disp: , Rfl:  .  levothyroxine (SYNTHROID, LEVOTHROID) 50 MCG tablet, Take 1 tablet (50 mcg total) by mouth daily., Disp: 90 tablet, Rfl: 1 .  mesalamine (ROWASA) 4 g enema, Place 60 mLs (4 g total) rectally at bedtime as needed. Place 60 mls rectally at bedtime, Disp: 1800 mL, Rfl: 6 .  Metoprolol Tartrate 37.5 MG TABS, Take 37.5 mg by mouth 2 (two) times daily., Disp: 60 tablet, Rfl: 3 .  Multiple Vitamin (MULTIVITAMIN) tablet, Take 1 tablet by mouth daily., Disp: , Rfl:  .  polyethylene glycol (MIRALAX / GLYCOLAX) packet, Take 17 g by mouth daily. , Disp: , Rfl:  .  sulfaSALAzine (AZULFIDINE) 500 MG tablet, TAKE 4 TABLETS BY MOUTH 3  TIMES DAILY, Disp: 1080 tablet, Rfl: 11  Past Medical History: Past Medical History:  Diagnosis Date  . Chronic kidney disease    kidney stones?  . Coronary artery calcification seen on CAT scan   . GERD (gastroesophageal reflux  disease)   . Hypercholesteremia   . Hypertension   . Hypothyroidism    no meds  . Sleep apnea    never been tested  . Ulcerative colitis     Tobacco Use: Social History   Tobacco Use  Smoking Status Never Smoker  Smokeless Tobacco Never Used    Labs: Recent Review Flowsheet Data    Labs for ITP Cardiac and Pulmonary Rehab Latest Ref Rng & Units 08/12/2017 08/12/2017 08/12/2017 08/12/2017 08/13/2017   Cholestrol <200 mg/dL - - - - -   LDLCALC mg/dL (calc) - - - - -   HDL >40 mg/dL - - - - -   Trlycerides <150 mg/dL - - - - -   Hemoglobin A1c 4.8 - 5.6 % - - - - -   PHART 7.350 - 7.450 7.333(L) 7.382 7.346(L) - -   PCO2ART 32.0 - 48.0 mmHg 42.2 31.8(L) 35.7 - -   HCO3 20.0 - 28.0 mmol/L 22.7 19.0(L) 19.5(L) - -   TCO2 22 - 32 mmol/L 24 20(L) 21(L) 19(L) 23   ACIDBASEDEF 0.0 - 2.0 mmol/L 3.0(H) 5.0(H) 6.0(H) - -   O2SAT % 95.0 92.0 97.0 - -       Exercise Target Goals:    Exercise Program Goal: Individual exercise prescription set using results from initial 6 min walk test and THRR  while considering  patient's activity barriers and safety.   Exercise Prescription Goal: Initial exercise prescription builds to 30-45 minutes a day of aerobic activity, 2-3 days per week.  Home exercise guidelines will be given to patient during program as part of exercise prescription that the participant will acknowledge.  Activity Barriers & Risk Stratification: Activity Barriers & Cardiac Risk Stratification - 09/19/17 1419      Activity Barriers & Cardiac Risk Stratification   Activity Barriers  None    Cardiac Risk Stratification  Moderate       6 Minute Walk: 6 Minute Walk    Row Name 09/19/17 1408 10/28/17 0959       6 Minute Walk   Phase  -  Discharge    Distance  1320 feet  2000 feet    Distance % Change  -  51.5 %    Distance Feet Change  -  780 ft    Walk Time  6 minutes  6 minutes    # of Rest Breaks  0  0    MPH  2.5  3.79    METS  3.18  4.11    RPE  12  13     Perceived Dyspnea   0  -    VO2 Peak  11.14  14.41    Symptoms  No  No    Resting HR  51 bpm  59 bpm    Resting BP  136/56  126/70    Resting Oxygen Saturation   99 %  -    Exercise Oxygen Saturation  during 6 min walk  96 %  -    Max Ex. HR  106 bpm  105 bpm    Max Ex. BP  158/68  136/74    2 Minute Post BP  124/74  -       Oxygen Initial Assessment:   Oxygen Re-Evaluation:   Oxygen Discharge (Final Oxygen Re-Evaluation):   Initial Exercise Prescription: Initial Exercise Prescription - 09/19/17 1400      Date of Initial Exercise RX and Referring Provider   Date  09/19/17    Referring Provider  [name]      Treadmill   MPH  2.5    Grade  0.5    Minutes  15    METs  3      Recumbant Bike   Level  3    RPM  60    Watts  30    Minutes  15    METs  3      NuStep   Level  3    SPM  80    Minutes  15    METs  3      Prescription Details   Frequency (times per week)  3    Duration  Progress to 45 minutes of aerobic exercise without signs/symptoms of physical distress      Intensity   THRR 40-80% of Max Heartrate  90-129    Ratings of Perceived Exertion  11-13    Perceived Dyspnea  0-4      Resistance Training   Training Prescription  Yes    Weight  3 lb    Reps  10-15       Perform Capillary Blood Glucose checks as needed.  Exercise Prescription Changes: Exercise Prescription Changes    Row Name 09/19/17 1400 09/25/17 1500 10/08/17 1500 10/14/17 0800 10/21/17 1300     Response to Exercise   Blood Pressure (Admit)  136/56  124/74  158/80  -  134/66   Blood Pressure (Exercise)  158/68  130/62  128/60  -  142/82   Blood Pressure (Exit)  124/74  124/74  120/72  -  126/52   Heart Rate (Admit)  51 bpm  47 bpm  46 bpm  -  68 bpm   Heart Rate (Exercise)  106 bpm  86 bpm  90 bpm  -  86 bpm   Heart Rate (Exit)  56 bpm  54 bpm  52 bpm  -  51 bpm   Rating of Perceived Exertion (Exercise)  -  12  13  -  13   Symptoms  -  none  none  -  none   Comments  -   first full day of exercise  -  -  -   Duration  -  Progress to 45 minutes of aerobic exercise without signs/symptoms of physical distress  Continue with 45 min of aerobic exercise without signs/symptoms of physical distress.  -  Continue with 45 min of aerobic exercise without signs/symptoms of physical distress.   Intensity  -  THRR unchanged  THRR unchanged  -  THRR unchanged     Progression   Progression  -  Continue to progress workloads to maintain intensity without signs/symptoms of physical distress.  Continue to progress workloads to maintain intensity without signs/symptoms of physical distress.  -  Continue to progress workloads to maintain intensity without signs/symptoms of physical distress.   Average METs  -  3  3.8  -  4.72     Resistance Training   Training Prescription  -  Yes  Yes  -  Yes   Weight  -  body weight  4 lbs  -  4 lbs   Reps  -  10-15  10-15  -  10-15     Interval Training   Interval Training  -  No  No  -  No     Treadmill   MPH  -  2.5  2.8  -  3   Grade  -  0.5  0.5  -  2   Minutes  -  15  15  -  15   METs  -  3  3.34  -  4.12     Recumbant Bike   Level  -  3  6  -  6   Watts  -  29  64  -  68   Minutes  -  15  15  -  15   METs  -  3  4.27  -  4.45     NuStep   Level  -  -  5  -  5   Minutes  -  -  15  -  15   METs  -  -  3.8  -  5.6     Home Exercise Plan   Plans to continue exercise at  -  -  -  Home (comment) treadmill  Home (comment) treadmill   Frequency  -  -  -  Add 2 additional days to program exercise sessions.  Add 2 additional days to program exercise sessions.   Initial Home Exercises Provided  -  -  -  10/14/17  10/14/17      Exercise Comments: Exercise Comments    Row Name 09/25/17 0750 11/08/17 0743         Exercise Comments  First  full day of exercise!  Patient was oriented to gym and equipment including functions, settings, policies, and procedures.  Patient's individual exercise prescription and treatment plan were  reviewed.  All starting workloads were established based on the results of the 6 minute walk test done at initial orientation visit.  The plan for exercise progression was also introduced and progression will be customized based on patient's performance and goals.  Stuart Gentry graduated today from  rehab with 36 sessions completed.  Details of the patient's exercise prescription and what He needs to do in order to continue the prescription and progress were discussed with patient.  Patient was given a copy of prescription and goals.  Patient verbalized understanding.  Stuart Gentry plans to continue to exercise by walking at home.         Exercise Goals and Review: Exercise Goals    Row Name 09/19/17 1407             Exercise Goals   Increase Physical Activity  Yes       Intervention  Provide advice, education, support and counseling about physical activity/exercise needs.;Develop an individualized exercise prescription for aerobic and resistive training based on initial evaluation findings, risk stratification, comorbidities and participant's personal goals.       Expected Outcomes  Short Term: Attend rehab on a regular basis to increase amount of physical activity.;Long Term: Add in home exercise to make exercise part of routine and to increase amount of physical activity.;Long Term: Exercising regularly at least 3-5 days a week.       Increase Strength and Stamina  Yes       Intervention  Provide advice, education, support and counseling about physical activity/exercise needs.;Develop an individualized exercise prescription for aerobic and resistive training based on initial evaluation findings, risk stratification, comorbidities and participant's personal goals.       Expected Outcomes  Short Term: Increase workloads from initial exercise prescription for resistance, speed, and METs.;Short Term: Perform resistance training exercises routinely during rehab and add in resistance training at home;Long Term:  Improve cardiorespiratory fitness, muscular endurance and strength as measured by increased METs and functional capacity (6MWT)       Able to understand and use rate of perceived exertion (RPE) scale  Yes       Intervention  Provide education and explanation on how to use RPE scale       Expected Outcomes  Short Term: Able to use RPE daily in rehab to express subjective intensity level;Long Term:  Able to use RPE to guide intensity level when exercising independently       Able to understand and use Dyspnea scale  Yes       Intervention  Provide education and explanation on how to use Dyspnea scale       Expected Outcomes  Short Term: Able to use Dyspnea scale daily in rehab to express subjective sense of shortness of breath during exertion;Long Term: Able to use Dyspnea scale to guide intensity level when exercising independently       Knowledge and understanding of Target Heart Rate Range (THRR)  Yes       Intervention  Provide education and explanation of THRR including how the numbers were predicted and where they are located for reference       Expected Outcomes  Short Term: Able to state/look up THRR;Short Term: Able to use daily as guideline for intensity in rehab;Long Term: Able to use THRR to govern intensity when exercising independently  Able to check pulse independently  Yes       Intervention  Provide education and demonstration on how to check pulse in carotid and radial arteries.;Review the importance of being able to check your own pulse for safety during independent exercise       Expected Outcomes  Short Term: Able to explain why pulse checking is important during independent exercise;Long Term: Able to check pulse independently and accurately       Understanding of Exercise Prescription  Yes       Intervention  Provide education, explanation, and written materials on patient's individual exercise prescription       Expected Outcomes  Short Term: Able to explain program exercise  prescription;Long Term: Able to explain home exercise prescription to exercise independently          Exercise Goals Re-Evaluation : Exercise Goals Re-Evaluation    Row Name 09/25/17 0750 10/08/17 1536 10/14/17 0846 10/21/17 1303 10/25/17 0825     Exercise Goal Re-Evaluation   Exercise Goals Review  Understanding of Exercise Prescription;Knowledge and understanding of Target Heart Rate Range (THRR);Able to understand and use rate of perceived exertion (RPE) scale  Increase Physical Activity;Increase Strength and Stamina;Understanding of Exercise Prescription  Increase Physical Activity;Able to understand and use rate of perceived exertion (RPE) scale;Knowledge and understanding of Target Heart Rate Range (THRR);Understanding of Exercise Prescription;Increase Strength and Stamina;Able to check pulse independently  Increase Physical Activity;Increase Strength and Stamina;Understanding of Exercise Prescription  Increase Physical Activity;Increase Strength and Stamina;Understanding of Exercise Prescription   Comments  Reviewed RPE scale, THR and program prescription with pt today.  Pt voiced understanding and was given a copy of goals to take home.   Glenda is off to a good start in rehab.  He is now up to 64 watts on the recumbent bike.  We will continue to monitor his progression.   Reviewed home exercise with pt today.  Pt plans to use treadmill at home for exercise.  Reviewed THR, pulse, RPE, sign and symptoms, NTG use, and when to call 911 or MD.  Also discussed weather considerations and indoor options.  Pt voiced understanding.  Stuart Gentry continues to do well with rehab.  He is up to 68 watts on the recumbent bike and level 5 on the NuStep.  She has also moved up to 3.0 mph.  We will continue to monitor his progress.   Stuart Gentry is doing well in rehab.  He has been using his treadmill at home. He is only getting 21mn once a week.  He is planning to join the [employer] to be able to continue to exericse after graduation.   He is getting stronger and has more stamina.  He has found that he is enjoying the exercise especially in the group setting.  He has been discouraged with his lifiting limits which are only for a few more weeks.  He also asked about getting back out to play golf starting on the driving range.     Expected Outcomes  Short: Use RPE daily to regulate intensity.  Long: Follow program prescription in THR.  Short: Review home exercise guidelines.  Long: Continue to follow program prescription.   Short: Add in at least one extra day a week. Long: Continue to exercise independently  Short: Continue to move up workloads.  Long: Continue to exercise on his own   Short: Add in more exercise at home. Go to driving range.  Long: Continue to work on stamina.  Discharge Exercise Prescription (Final Exercise Prescription Changes): Exercise Prescription Changes - 10/21/17 1300      Response to Exercise   Blood Pressure (Admit)  134/66    Blood Pressure (Exercise)  142/82    Blood Pressure (Exit)  126/52    Heart Rate (Admit)  68 bpm    Heart Rate (Exercise)  86 bpm    Heart Rate (Exit)  51 bpm    Rating of Perceived Exertion (Exercise)  13    Symptoms  none    Duration  Continue with 45 min of aerobic exercise without signs/symptoms of physical distress.    Intensity  THRR unchanged      Progression   Progression  Continue to progress workloads to maintain intensity without signs/symptoms of physical distress.    Average METs  4.72      Resistance Training   Training Prescription  Yes    Weight  4 lbs    Reps  10-15      Interval Training   Interval Training  No      Treadmill   MPH  3    Grade  2    Minutes  15    METs  4.12      Recumbant Bike   Level  6    Watts  68    Minutes  15    METs  4.45      NuStep   Level  5    Minutes  15    METs  5.6      Home Exercise Plan   Plans to continue exercise at  Home (comment) treadmill    Frequency  Add 2 additional days to program  exercise sessions.    Initial Home Exercises Provided  10/14/17       Nutrition:  Target Goals: Understanding of nutrition guidelines, daily intake of sodium <1557m, cholesterol <2050m calories 30% from fat and 7% or less from saturated fats, daily to have 5 or more servings of fruits and vegetables.  Biometrics: Pre Biometrics - 09/19/17 1406      Pre Biometrics   Height  _0  (1.803 m)    Weight  200 lb 11.2 oz (91 kg)    Waist Circumference  41 inches    Hip Circumference  42 inches    Waist to Hip Ratio  0.98 %    BMI (Calculated)  28    Single Leg Stand  8.07 seconds      Post Biometrics - 10/28/17 1001       Post  Biometrics   Height  _1  (1.803 m)    Weight  201 lb 14.4 oz (91.6 kg)    Waist Circumference  41.5 inches    Hip Circumference  40.5 inches    Waist to Hip Ratio  1.02 %    BMI (Calculated)  28.17    Single Leg Stand  11.69 seconds       Nutrition Therapy Plan and Nutrition Goals: Nutrition Therapy & Goals - 10/14/17 1135      Nutrition Therapy   Diet  Instructed patient accompanied by his wife on a meal plan based on heart healthy dietary principles    Drug/Food Interactions  Statins/Certain Fruits    Protein (specify units)  8    Fiber  30 grams    Whole Grain Foods  3 servings    Saturated Fats  14 max. grams    Fruits and Vegetables  5 servings/day  Sodium  1500 grams      Personal Nutrition Goals   Nutrition Goal  Continue to read labels for saturated fat and sodium.    Personal Goal #2  Work to increase fruit/vegetable intake.    Personal Goal #3  Try very low sodium tuna; Also, try a variety of Ms. DASH seasonings- lemon pepper, garlic herb or Ms. DASH packets of taco seasoning, pot roast or chili seasonings.    Personal Goal #4  Increase water intake to 6-8 cups daily.    Additional Goals?  No      Intervention Plan   Intervention  Prescribe, educate and counsel regarding individualized specific dietary modifications aiming  towards targeted core components such as weight, hypertension, lipid management, diabetes, heart failure and other comorbidities.;Nutrition handout(s) given to patient.    Expected Outcomes  Short Term Goal: Understand basic principles of dietary content, such as calories, fat, sodium, cholesterol and nutrients.;Short Term Goal: A plan has been developed with personal nutrition goals set during dietitian appointment.;Long Term Goal: Adherence to prescribed nutrition plan.       Nutrition Assessments: Nutrition Assessments - 11/01/17 0819      MEDFICTS Scores   Pre Score  38    Post Score  41    Score Difference  3       Nutrition Goals Re-Evaluation: Nutrition Goals Re-Evaluation    Stuart Gentry Name 10/25/17 5053             Goals   Nutrition Goal  Read labels, limit salts, more fruit/vegetables       Comment  His wife has been keeping him honest on his diet.  He is limiting his salt intake and portion sizes.  He is using his air fryer which has made a big difference for him.  He is using the Mrs Deliah Boston seasonings in his food now instead of salt.  He has been trying to get more fruits and vegetabels too.        Expected Outcome  Short: Continue to try new recipes.  Long: Continue to follow heart healthy recommendations.           Nutrition Goals Discharge (Final Nutrition Goals Re-Evaluation): Nutrition Goals Re-Evaluation - 10/25/17 9767      Goals   Nutrition Goal  Read labels, limit salts, more fruit/vegetables    Comment  His wife has been keeping him honest on his diet.  He is limiting his salt intake and portion sizes.  He is using his air fryer which has made a big difference for him.  He is using the Mrs Deliah Boston seasonings in his food now instead of salt.  He has been trying to get more fruits and vegetabels too.     Expected Outcome  Short: Continue to try new recipes.  Long: Continue to follow heart healthy recommendations.        Psychosocial: Target Goals: Acknowledge presence  or absence of significant depression and/or stress, maximize coping skills, provide positive support system. Participant is able to verbalize types and ability to use techniques and skills needed for reducing stress and depression.   Initial Review & Psychosocial Screening: Initial Psych Review & Screening - 09/19/17 1416      Initial Review   Current issues with  Current Stress Concerns    Source of Stress Concerns  Unable to perform yard/household activities;Unable to participate in former interests or hobbies      [place]?  Yes wife, son,  brothers (out of state)       Barriers   Psychosocial barriers to participate in program  There are no identifiable barriers or psychosocial needs.;The patient should benefit from training in stress management and relaxation.      Screening Interventions   Interventions  Program counselor consult;Encouraged to exercise;Provide feedback about the scores to participant;To provide support and resources with identified psychosocial needs    Expected Outcomes  Short Term goal: Utilizing psychosocial counselor, staff and physician to assist with identification of specific Stressors or current issues interfering with healing process. Setting desired goal for each stressor or current issue identified.;Long Term Goal: Stressors or current issues are controlled or eliminated.;Short Term goal: Identification and review with participant of any Quality of Life or Depression concerns found by scoring the questionnaire.;Long Term goal: The participant improves quality of Life and PHQ9 Scores as seen by post scores and/or verbalization of changes       Quality of Life Scores:  Quality of Life - 11/01/17 0820      Quality of Life   Select  Quality of Life      Quality of Life Scores   Health/Function Pre  23 %    Health/Function Post  27.2 %    Health/Function % Change  18.26 %    Socioeconomic Pre  28.33 %    Socioeconomic Post  27.5  %    Socioeconomic % Change   -2.93 %    Psych/Spiritual Pre  27.33 %    Psych/Spiritual Post  27.21 %    Psych/Spiritual % Change  -0.44 %    Family Pre  28.8 %    Family Post  27.6 %    Family % Change  -4.17 %    GLOBAL Pre  27.73 %    GLOBAL Post  27.32 %    GLOBAL % Change  -1.48 %      Scores of 19 and below usually indicate a poorer quality of life in these areas.  A difference of  2-3 points is a clinically meaningful difference.  A difference of 2-3 points in the total score of the Quality of Life Index has been associated with significant improvement in overall quality of life, self-image, physical symptoms, and general health in studies assessing change in quality of life.  PHQ-9: Recent Review Flowsheet Data    Depression screen [facility] 2/9 11/01/2017 09/19/2017 06/14/2017 06/14/2017 10/05/2013   Decreased Interest 0 0 0 0 0   Down, Depressed, Hopeless 0 0 0 0 0   PHQ - 2 Score 0 0 0 0 0   Altered sleeping 1 0 - - -   Tired, decreased energy 1 1 - - -   Change in appetite 0 0 - - -   Feeling bad or failure about yourself  0 1 - - -   Trouble concentrating 0 0 - - -   Moving slowly or fidgety/restless 0 0 - - -   Suicidal thoughts 0 0 - - -   PHQ-9 Score 2 2 - - -   Difficult doing work/chores Not difficult at all Not difficult at all - - -     Interpretation of Total Score  Total Score Depression Severity:  1-4 = Minimal depression, 5-9 = Mild depression, 10-14 = Moderate depression, 15-19 = Moderately severe depression, 20-27 = Severe depression   Psychosocial Evaluation and Intervention: Psychosocial Evaluation - 09/30/17 0947      Psychosocial Evaluation & Interventions   Interventions  Encouraged to exercise with the program and follow exercise prescription    Comments  Counselor met with Stuart Gentry) today for initial psychosocial evaluation.  He is a 72 year old who has a strong support system with a spouse of 30+ years; an adult son locally and lots of family in  [place] who stay in touch frequently.  Dartanyan had a CABGx6 in mid April as well as HB and a history of ulcerative colitis for over 40 years.  He is also currently struggling with some thyroid issues resulting in tiredness and is being monitored by his Dr on this.  Stuart Gentry reports he sleeps well and has a good appetite.  He denies a history of depression or anxiety or any current symptoms and is typically in a positive mood.  Stuart Gentry states his biggest stressor is his health and the frustration of how it limits his activities.  Jadie has goals for increasing his energy to be "better than before" so he can return to playing golf with his grandson again.   He already is enjoying the program here and is inspired by the other participants and their commitment to exercise.  STaff will follow with Stuart Gentry.    Expected Outcomes  Short:  Stuart Gentry will increase his stamina and strength to be able to play golf again with his grandson.   Long:  Stuart Gentry will continue to exercise consistently to improve his overall health.      Continue Psychosocial Services   Follow up required by staff       Psychosocial Re-Evaluation: Psychosocial Re-Evaluation    Stuart Gentry Name 10/25/17 [phone]             Psychosocial Re-Evaluation   Current issues with  Current Stress Concerns       Comments  Stuart Gentry has been doing well in rehab.  He was not a fan of exericse but likes coming out to the group to have others "suffer through it with him". He continues to sleep well and his health continues to be his biggest stressor. He is continuing to work towards getting back out on the course.  He is going to start with going to the driving range.        Expected Outcomes  Short: Continue to work toward getting back on course.  Long: Continue to cope with health issues.        Interventions  Encouraged to attend Cardiac Rehabilitation for the exercise       Continue Psychosocial Services   Follow up required by staff          Psychosocial Discharge (Final  Psychosocial Re-Evaluation): Psychosocial Re-Evaluation - 10/25/17 0835      Psychosocial Re-Evaluation   Current issues with  Current Stress Concerns    Comments  Stuart Gentry has been doing well in rehab.  He was not a fan of exericse but likes coming out to the group to have others "suffer through it with him". He continues to sleep well and his health continues to be his biggest stressor. He is continuing to work towards getting back out on the course.  He is going to start with going to the driving range.     Expected Outcomes  Short: Continue to work toward getting back on course.  Long: Continue to cope with health issues.     Interventions  Encouraged to attend Cardiac Rehabilitation for the exercise    Continue Psychosocial Services   Follow up required by staff  Vocational Rehabilitation: Provide vocational rehab assistance to qualifying candidates.   Vocational Rehab Evaluation & Intervention: Vocational Rehab - 09/19/17 1149      Initial Vocational Rehab Evaluation & Intervention   Assessment shows need for Vocational Rehabilitation  No       Education: Education Goals: Education classes will be provided on a variety of topics geared toward better understanding of heart health and risk factor modification. Participant will state understanding/return demonstration of topics presented as noted by education test scores.  Learning Barriers/Preferences: Learning Barriers/Preferences - 09/19/17 1417      Learning Barriers/Preferences   Learning Barriers  None    Learning Preferences  None       Education Topics:  AED/CPR: - Group verbal and written instruction with the use of models to demonstrate the basic use of the AED with the basic ABC's of resuscitation.   General Nutrition Guidelines/Fats and Fiber: -Group instruction provided by verbal, written material, models and posters to present the general guidelines for heart healthy nutrition. Gives an explanation and  review of dietary fats and fiber.   Controlling Sodium/Reading Food Labels: -Group verbal and written material supporting the discussion of sodium use in heart healthy nutrition. Review and explanation with models, verbal and written materials for utilization of the food label.   Cardiac Rehab from 11/06/2017 in [facility] Cardiac and Pulmonary Rehab  Date  09/30/17  Educator  CR  Instruction Review Code  1- Verbalizes Understanding      Exercise Physiology & General Exercise Guidelines: - Group verbal and written instruction with models to review the exercise physiology of the cardiovascular system and associated critical values. Provides general exercise guidelines with specific guidelines to those with heart or lung disease.    Cardiac Rehab from 11/06/2017 in [facility] Cardiac and Pulmonary Rehab  Date  10/07/17  Educator  [facility]  Instruction Review Code  1- Verbalizes Understanding      Aerobic Exercise & Resistance Training: - Gives group verbal and written instruction on the various components of exercise. Focuses on aerobic and resistive training programs and the benefits of this training and how to safely progress through these programs..   Cardiac Rehab from 11/06/2017 in [facility] Cardiac and Pulmonary Rehab  Date  10/14/17  Educator  [facility]  Instruction Review Code  1- Geologist, engineering, Balance, Mind/Body Relaxation: Provides group verbal/written instruction on the benefits of flexibility and balance training, including mind/body exercise modes such as yoga, pilates and tai chi.  Demonstration and skill practice provided.   Cardiac Rehab from 11/06/2017 in [facility] Cardiac and Pulmonary Rehab  Date  10/16/17  Educator  [facility]  Instruction Review Code  1- Verbalizes Understanding      Stress and Anxiety: - Provides group verbal and written instruction about the health risks of elevated stress and causes of high stress.  Discuss the correlation between heart/lung disease and  anxiety and treatment options. Review healthy ways to manage with stress and anxiety.   Cardiac Rehab from 11/06/2017 in [facility] Cardiac and Pulmonary Rehab  Date  11/06/17  Educator  [facility]  Instruction Review Code  1- Verbalizes Understanding      Depression: - Provides group verbal and written instruction on the correlation between heart/lung disease and depressed mood, treatment options, and the stigmas associated with seeking treatment.   Cardiac Rehab from 11/06/2017 in [facility] Cardiac and Pulmonary Rehab  Date  10/09/17  Educator  [facility]  Instruction Review Code  1- Verbalizes Understanding  Anatomy & Physiology of the Heart: - Group verbal and written instruction and models provide basic cardiac anatomy and physiology, with the coronary electrical and arterial systems. Review of Valvular disease and Heart Failure   Cardiac Rehab from 11/06/2017 in [facility] Cardiac and Pulmonary Rehab  Date  11/04/17  Educator  [facility]  Instruction Review Code  1- Verbalizes Understanding      Cardiac Procedures: - Group verbal and written instruction to review commonly prescribed medications for heart disease. Reviews the medication, class of the drug, and side effects. Includes the steps to properly store meds and maintain the prescription regimen. (beta blockers and nitrates)   Cardiac Rehab from 11/06/2017 in [facility] Cardiac and Pulmonary Rehab  Date  10/28/17  Educator  SB  Instruction Review Code  1- Verbalizes Understanding      Cardiac Medications I: - Group verbal and written instruction to review commonly prescribed medications for heart disease. Reviews the medication, class of the drug, and side effects. Includes the steps to properly store meds and maintain the prescription regimen.   Cardiac Rehab from 11/06/2017 in [facility] Cardiac and Pulmonary Rehab  Date  10/21/17  Educator  CE  Instruction Review Code  1- Verbalizes Understanding      Cardiac Medications II: -Group verbal and written  instruction to review commonly prescribed medications for heart disease. Reviews the medication, class of the drug, and side effects. (all other drug classes)   Cardiac Rehab from 11/06/2017 in [facility] Cardiac and Pulmonary Rehab  Date  10/02/17  Educator  SB  Instruction Review Code  1- Verbalizes Understanding       Go Sex-Intimacy & Heart Disease, Get SMART - Goal Setting: - Group verbal and written instruction through game format to discuss heart disease and the return to sexual intimacy. Provides group verbal and written material to discuss and apply goal setting through the application of the S.M.A.R.T. Method.   Cardiac Rehab from 11/06/2017 in [facility] Cardiac and Pulmonary Rehab  Date  10/28/17  Educator  SB  Instruction Review Code  1- Verbalizes Understanding      Other Matters of the Heart: - Provides group verbal, written materials and models to describe Stable Angina and Peripheral Artery. Includes description of the disease process and treatment options available to the cardiac patient.   Exercise & Equipment Safety: - Individual verbal instruction and demonstration of equipment use and safety with use of the equipment.   Cardiac Rehab from 11/06/2017 in [facility] Cardiac and Pulmonary Rehab  Date  09/19/17  Educator  [facility]  Instruction Review Code  1- Verbalizes Understanding      Infection Prevention: - Provides verbal and written material to individual with discussion of infection control including proper hand washing and proper equipment cleaning during exercise session.   Cardiac Rehab from 11/06/2017 in [facility] Cardiac and Pulmonary Rehab  Date  09/19/17  Educator  [facility]  Instruction Review Code  1- Verbalizes Understanding      Falls Prevention: - Provides verbal and written material to individual with discussion of falls prevention and safety.   Cardiac Rehab from 11/06/2017 in [facility] Cardiac and Pulmonary Rehab  Date  09/19/17  Educator  [facility]  Instruction Review Code  1-  Verbalizes Understanding      Diabetes: - Individual verbal and written instruction to review signs/symptoms of diabetes, desired ranges of glucose level fasting, after meals and with exercise. Acknowledge that pre and post exercise glucose checks will be done for 3 sessions at entry of program.  Know Your Numbers and Risk Factors: -Group verbal and written instruction about important numbers in your health.  Discussion of what are risk factors and how they play a role in the disease process.  Review of Cholesterol, Blood Pressure, Diabetes, and BMI and the role they play in your overall health.   Cardiac Rehab from 11/06/2017 in [facility] Cardiac and Pulmonary Rehab  Date  10/02/17  Educator  SB  Instruction Review Code  1- Verbalizes Understanding      Sleep Hygiene: -Provides group verbal and written instruction about how sleep can affect your health.  Define sleep hygiene, discuss sleep cycles and impact of sleep habits. Review good sleep hygiene tips.    Other: -Provides group and verbal instruction on various topics (see comments)   Knowledge Questionnaire Score: Knowledge Questionnaire Score - 11/01/17 0819      Knowledge Questionnaire Score   Pre Score  19/26        Angina, Depresion, Exercise and Nutrition areas for further education    Post Score  25/28 reviewed test with pt today       Core Components/Risk Factors/Patient Goals at Admission: Personal Goals and Risk Factors at Admission - 09/19/17 1415      Core Components/Risk Factors/Patient Goals on Admission    Weight Management  Yes;Weight Maintenance    Intervention  Weight Management: Develop a combined nutrition and exercise program designed to reach desired caloric intake, while maintaining appropriate intake of nutrient and fiber, sodium and fats, and appropriate energy expenditure required for the weight goal.;Weight Management: Provide education and appropriate resources to help participant work on and attain  dietary goals.    Admit Weight  200 lb (90.7 kg)    Expected Outcomes  Short Term: Continue to assess and modify interventions until short term weight is achieved;Long Term: Adherence to nutrition and physical activity/exercise program aimed toward attainment of established weight goal;Weight Maintenance: Understanding of the daily nutrition guidelines, which includes 25-35% calories from fat, 7% or less cal from saturated fats, less than 262m cholesterol, less than 1.5gm of sodium, & 5 or more servings of fruits and vegetables daily;Understanding recommendations for meals to include 15-35% energy as protein, 25-35% energy from fat, 35-60% energy from carbohydrates, less than 2069mof dietary cholesterol, 20-35 gm of total fiber daily;Understanding of distribution of calorie intake throughout the day with the consumption of 4-5 meals/snacks    Hypertension  Yes    Intervention  Provide education on lifestyle modifcations including regular physical activity/exercise, weight management, moderate sodium restriction and increased consumption of fresh fruit, vegetables, and low fat dairy, alcohol moderation, and smoking cessation.;Monitor prescription use compliance.    Expected Outcomes  Short Term: Continued assessment and intervention until BP is < 140/903mG in hypertensive participants. < 130/69m56m in hypertensive participants with diabetes, heart failure or chronic kidney disease.;Long Term: Maintenance of blood pressure at goal levels.    Lipids  Yes    Intervention  Provide education and support for participant on nutrition & aerobic/resistive exercise along with prescribed medications to achieve LDL <70mg37mL >40mg.54mExpected Outcomes  Short Term: Participant states understanding of desired cholesterol values and is compliant with medications prescribed. Participant is following exercise prescription and nutrition guidelines.;Long Term: Cholesterol controlled with medications as prescribed, with  individualized exercise RX and with personalized nutrition plan. Value goals: LDL < 70mg, 24m> 40 mg.       Core Components/Risk Factors/Patient Goals Review:  Goals and Risk Factor Review  A[place]ell Name 10/25/17 0827             Core Components/Risk Factors/Patient Goals Review   Personal Goals Review  Weight Management/Obesity;Hypertension;Lipids       Review  Irwin has been doing well in rehab. His weight has remained stable.  His blood pressures have been good and he checks them at home. He checks on his off days.  He is doing well on his medications.  He has uped his thyroid medication recently.        Expected Outcomes  Short: Continue to work on weight loss.  Long: Continue to monitor risk factors.           Core Components/Risk Factors/Patient Goals at Discharge (Final Review):  Goals and Risk Factor Review - 10/25/17 0827      Core Components/Risk Factors/Patient Goals Review   Personal Goals Review  Weight Management/Obesity;Hypertension;Lipids    Review  Nilay has been doing well in rehab. His weight has remained stable.  His blood pressures have been good and he checks them at home. He checks on his off days.  He is doing well on his medications.  He has uped his thyroid medication recently.     Expected Outcomes  Short: Continue to work on weight loss.  Long: Continue to monitor risk factors.        ITP Comments: ITP Comments    Row Name 09/19/17 1341 10/16/17 0537         ITP Comments  Med Review completed. Initial ITP created. Diagnosis can be found in [facility] Encounter 4/12  30 day review- Continue with ITP unless directed changes per Medical Director review.   New to program 6/3         Comments: Discharge ITP

## 2017-12-09 ENCOUNTER — Other Ambulatory Visit: Payer: Self-pay | Admitting: Physician Assistant

## 2017-12-13 ENCOUNTER — Other Ambulatory Visit: Payer: Medicare Other

## 2017-12-13 DIAGNOSIS — E039 Hypothyroidism, unspecified: Secondary | ICD-10-CM | POA: Diagnosis not present

## 2017-12-13 DIAGNOSIS — E038 Other specified hypothyroidism: Secondary | ICD-10-CM

## 2017-12-13 LAB — TSH: TSH: 4.71 mIU/L — ABNORMAL HIGH (ref 0.40–4.50)

## 2017-12-15 NOTE — Progress Notes (Signed)
Cardiology Office Note   Date:  12/16/2017   ID:  Stuart Gentry, DOB 1945-12-20, MRN [id number]  PCP:  [name], MD  Cardiologist:   Jenkins Rouge, MD   No chief complaint on file.     History of Present Illness:  72 y.o. seen initially 07/2017 for chest pain. CRF;s include HTN, HLD, Had CT abdomen 09/12/16 showed aortic atherosclerosis and coronary calcium. Overweight and sedentary with some exertional dyspnea Hypothyroid on replacement   Retired from [employer]  Wife indicates he watches tv tood much.   TTE done 08/10/17 EF 55-60% Trivial AR normal estimated PA pressure Stress testing positive and had cath with CM on 08/09/17 Films reviewed  CTO mid LAD with severe 3 VD Referred for CABG with Dr Cyndia Bent on 08/12/17 LIMA to LAD SVG sequential D1/D2 Sequential graft to OM1/OM3 and SVG PDA Post op course complicated by PAFconverted with amiodaroe and no anticoagulation needed  D/C with amiodarone 200 bid stopped last visit with PA   Wants to lift more weight at [facility].    Past Medical History:  Diagnosis Date  . Chronic kidney disease    kidney stones?  . Coronary artery calcification seen on CAT scan   . GERD (gastroesophageal reflux disease)   . Hypercholesteremia   . Hypertension   . Hypothyroidism    no meds  . Sleep apnea    never been tested  . Ulcerative colitis     Past Surgical History:  Procedure Laterality Date  . COLONOSCOPY    . CORONARY ARTERY BYPASS GRAFT N/A 08/12/2017   Procedure: CORONARY ARTERY BYPASS GRAFTING (CABG) x six , using left internal mammory artery and right leg greater saphenous vein harvested endoscopically.;  Surgeon: Gaye Pollack, MD;  Location: [facility] OR;  Service: Open Heart Surgery;  Laterality: N/A;  . LEFT HEART CATH AND CORONARY ANGIOGRAPHY N/A 08/09/2017   Procedure: LEFT HEART CATH AND CORONARY ANGIOGRAPHY;  Surgeon: Burnell Blanks, MD;  Location: [place] CV LAB;  Service: Cardiovascular;  Laterality: N/A;  . SKIN  TAG REMOVAL Right 2018   underarm  . TEE WITHOUT CARDIOVERSION N/A 08/12/2017   Procedure: TRANSESOPHAGEAL ECHOCARDIOGRAM (TEE);  Surgeon: Gaye Pollack, MD;  Location: [place];  Service: Open Heart Surgery;  Laterality: N/A;     Current Outpatient Medications  Medication Sig Dispense Refill  . acetaminophen (TYLENOL) 325 MG tablet Take 2 tablets (650 mg total) by mouth every 6 (six) hours as needed for mild pain.    Marland Kitchen aspirin EC 81 MG tablet Take 1 tablet (81 mg total) by mouth daily.    Marland Kitchen atorvastatin (LIPITOR) 80 MG tablet Take 1 tablet (80 mg total) by mouth daily at 6 PM. 30 tablet 3  . esomeprazole (NEXIUM) 20 MG capsule Take 20 mg by mouth every morning.     . mesalamine (ROWASA) 4 g enema Place 60 mLs (4 g total) rectally at bedtime as needed. Place 60 mls rectally at bedtime 1800 mL 6  . Metoprolol Tartrate 37.5 MG TABS Take 37.5 mg by mouth 2 (two) times daily. 60 tablet 3  . Multiple Vitamin (MULTIVITAMIN) tablet Take 1 tablet by mouth daily.    . polyethylene glycol (MIRALAX / GLYCOLAX) packet Take 17 g by mouth daily.     Marland Kitchen sulfaSALAzine (AZULFIDINE) 500 MG tablet TAKE 4 TABLETS BY MOUTH 3  TIMES DAILY 1080 tablet 11  . levothyroxine (SYNTHROID, LEVOTHROID) 75 MCG tablet Take 1 tablet (75 mcg total) by mouth  daily. 90 tablet 0   No current facility-administered medications for this visit.     Allergies:   Patient has no known allergies.    Social History:  The patient  reports that he has never smoked. He has never used smokeless tobacco. He reports that he drinks alcohol. He reports that he does not use drugs.   Family History:  The patient's family history includes Heart disease in his brother; Kidney disease in his brother; Liver cancer in his mother; Lymphoma in his brother.    ROS:  Please see the history of present illness.   Otherwise, review of systems are positive for none.   All other systems are reviewed and negative.    PHYSICAL EXAM: VS:  BP 122/80   Pulse  (!) 48   Ht 5' 11"  (1.803 m)   Wt 205 lb 8 oz (93.2 kg)   SpO2 96%   BMI 28.66 kg/m  , BMI Body mass index is 28.66 kg/m. Affect appropriate Healthy:  appears stated age 43: normal Neck supple with no adenopathy JVP normal no bruits no thyromegaly Lungs clear with no wheezing and good diaphragmatic motion Heart:  S1/S2 no murmur, no rub, gallop or click PMI normal Post sternotomy well healed  Abdomen: benighn, BS positve, no tenderness, no AAA no bruit.  No HSM or HJR Distal pulses intact with no bruits No edema Neuro non-focal Skin warm and dry No muscular weakness Right SVG harvest     EKG:   SR rate 57 non specific QRS widening LAFB    Recent Labs: 08/13/2017: Magnesium 2.3 09/04/2017: ALT 27; BUN 15; Creatinine, Ser 1.20; Hemoglobin 12.0; Platelets 297; Potassium 4.7; Sodium 141 12/13/2017: TSH 4.71    Lipid Panel    Component Value Date/Time   CHOL 155 06/11/2017 0850   TRIG 90 06/11/2017 0850   HDL 47 06/11/2017 0850   CHOLHDL 3.3 06/11/2017 0850   VLDL 20 11/07/2016 0816   LDLCALC 90 06/11/2017 0850      Wt Readings from Last 3 Encounters:  12/16/17 205 lb 8 oz (93.2 kg)  10/28/17 201 lb 14.4 oz (91.6 kg)  09/19/17 200 lb 11.2 oz (91 kg)      Other studies Reviewed: Additional studies/ records that were reviewed today include: Notes primary 06/18/17 labs, Abdominal CT ECG.    ASSESSMENT AND PLAN:  1.  CAD/CABG 07/2017 improved  SVG sight and sternum well healed  2. Dyspnea:  Finished cardiac rehab EF normal by TTE continue weight loss and risk factor modification    4. HLD:  Continue statin  Lab Results  Component Value Date   LDLCALC 90 06/11/2017   5. PAF:  Maintaining NSR off amiodarone improved  6. Thyroid:  On replacement TSH normal f/u with Dr [name]  IBS:  Continue immune meds BM's regular no melena   Current medicines are reviewed at length with the patient today.  The patient does not have concerns regarding medicines.  The  following changes have been made:  no change  Labs/ tests ordered today include: None    No orders of the defined types were placed in this encounter.    Disposition:   FU with cardiology      Signed, Jenkins Rouge, MD  12/16/2017 10:13 AM    [address], [place], [place]  [address] Phone: [phone]; Fax: [phone]

## 2017-12-16 ENCOUNTER — Encounter: Payer: Self-pay | Admitting: Cardiovascular Disease

## 2017-12-16 ENCOUNTER — Ambulatory Visit (INDEPENDENT_AMBULATORY_CARE_PROVIDER_SITE_OTHER): Payer: Medicare Other | Admitting: Cardiovascular Disease

## 2017-12-16 ENCOUNTER — Telehealth: Payer: Self-pay | Admitting: Cardiovascular Disease

## 2017-12-16 ENCOUNTER — Other Ambulatory Visit: Payer: Self-pay | Admitting: Family Medicine

## 2017-12-16 ENCOUNTER — Other Ambulatory Visit: Payer: Self-pay | Admitting: Physician Assistant

## 2017-12-16 VITALS — BP 122/80 | HR 48 | Ht 71.0 in | Wt 205.5 lb

## 2017-12-16 DIAGNOSIS — E039 Hypothyroidism, unspecified: Secondary | ICD-10-CM

## 2017-12-16 DIAGNOSIS — I2510 Atherosclerotic heart disease of native coronary artery without angina pectoris: Secondary | ICD-10-CM

## 2017-12-16 DIAGNOSIS — E038 Other specified hypothyroidism: Secondary | ICD-10-CM

## 2017-12-16 DIAGNOSIS — Z951 Presence of aortocoronary bypass graft: Secondary | ICD-10-CM

## 2017-12-16 MED ORDER — LEVOTHYROXINE SODIUM 75 MCG PO TABS: 75.0000 ug | ORAL_TABLET | Freq: Every day | ORAL | 0 refills | Status: DC

## 2017-12-16 MED ORDER — NITROGLYCERIN 0.4 MG SL SUBL: SUBLINGUAL_TABLET | SUBLINGUAL | 3 refills | Status: DC

## 2017-12-16 MED ORDER — METOPROLOL TARTRATE 25 MG PO TABS: 37.5000 mg | ORAL_TABLET | Freq: Two times a day (BID) | ORAL | 11 refills | Status: DC

## 2017-12-16 NOTE — Telephone Encounter (Signed)
Corrected patient's metoprolol, so patient can take as directed.

## 2017-12-16 NOTE — Addendum Note (Signed)
Addended by: Shary Decamp B on: 12/16/2017 10:18 AM   Modules accepted: Orders

## 2017-12-16 NOTE — Telephone Encounter (Signed)
Pt was prescribed metoprolol 37.5 mg tablet by mouth BID in the hospital. This medication does not come in this strength. Pt's pharmacy is requested metoprolol 25 mg tablet taking 1 1/2 tablet by mouth BID. Would Dr. [name] like to refill this medication as pt's pharmacy requested. Please address

## 2017-12-16 NOTE — Addendum Note (Signed)
Addended by: Aris Georgia, Elexius Minar L on: 12/16/2017 10:41 AM   Modules accepted: Orders

## 2017-12-16 NOTE — Patient Instructions (Signed)

## 2018-01-06 ENCOUNTER — Other Ambulatory Visit: Payer: Self-pay | Admitting: Physician Assistant

## 2018-01-13 ENCOUNTER — Telehealth: Payer: Self-pay | Admitting: Cardiovascular Disease

## 2018-01-13 ENCOUNTER — Other Ambulatory Visit: Payer: Self-pay | Admitting: Physician Assistant

## 2018-01-13 DIAGNOSIS — E785 Hyperlipidemia, unspecified: Secondary | ICD-10-CM

## 2018-01-13 MED ORDER — ATORVASTATIN CALCIUM 80 MG PO TABS: 80.0000 mg | ORAL_TABLET | Freq: Every day | ORAL | 3 refills | Status: DC

## 2018-01-13 NOTE — Telephone Encounter (Signed)
Dr. [name] saw patient last month for office visit, 12/16/17. Patient was to continue statin per Dr. Kyla Balzarine note. Will send in refill.

## 2018-01-13 NOTE — Addendum Note (Signed)
Addended by: Aris Georgia, Wende Longstreth L on: 01/13/2018 12:18 PM   Modules accepted: Orders

## 2018-01-13 NOTE — Telephone Encounter (Signed)
Can refill lipitor 80 mg need updated lipid and liver profile with [place] or Dr [name]

## 2018-01-13 NOTE — Telephone Encounter (Signed)
Patient coming on 01/14/18 for fasting lab work.

## 2018-01-13 NOTE — Telephone Encounter (Signed)
Pt was prescribed Atorvastatin 80 mg tablet in the hospital. Would Dr. [name] like to refill this medication? [facility] in [place] requesting a refill for this medication. Please address

## 2018-01-14 ENCOUNTER — Other Ambulatory Visit: Payer: Medicare Other

## 2018-01-14 DIAGNOSIS — E785 Hyperlipidemia, unspecified: Secondary | ICD-10-CM

## 2018-01-14 LAB — HEPATIC FUNCTION PANEL
ALT: 17 IU/L (ref 0–44)
AST: 22 IU/L (ref 0–40)
Albumin: 3.9 g/dL (ref 3.5–4.8)
Alkaline Phosphatase: 88 IU/L (ref 39–117)
BILIRUBIN TOTAL: 0.3 mg/dL (ref 0.0–1.2)
BILIRUBIN, DIRECT: 0.11 mg/dL (ref 0.00–0.40)
Total Protein: 6.4 g/dL (ref 6.0–8.5)

## 2018-01-14 LAB — LIPID PANEL
CHOLESTEROL TOTAL: 124 mg/dL (ref 100–199)
Chol/HDL Ratio: 2.4 ratio (ref 0.0–5.0)
HDL: 51 mg/dL (ref >39)
LDL CALC: 59 mg/dL (ref 0–99)
TRIGLYCERIDES: 68 mg/dL (ref 0–149)
VLDL Cholesterol Cal: 14 mg/dL (ref 5–40)

## 2018-01-23 ENCOUNTER — Ambulatory Visit: Payer: Medicare Other | Admitting: Family Medicine

## 2018-02-09 ENCOUNTER — Other Ambulatory Visit: Payer: Self-pay | Admitting: Gastroenterology

## 2018-04-04 DIAGNOSIS — Z23 Encounter for immunization: Secondary | ICD-10-CM | POA: Diagnosis not present

## 2018-04-30 HISTORY — PX: COLONOSCOPY: SHX174

## 2018-06-05 ENCOUNTER — Other Ambulatory Visit: Payer: Self-pay | Admitting: Family Medicine

## 2018-06-17 NOTE — Progress Notes (Signed)
Cardiology Office Note   Date:  06/20/2018   ID:  Stuart Gentry, Stuart Gentry 06-03-1945, MRN [id number]+  PCP:  [name], MD  Cardiologist:   Jenkins Rouge, MD   No chief complaint on file.     History of Present Illness:  73 y.o. seen initially 07/2017 for chest pain. CRF;s include HTN, HLD.  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 Chronic kidney disease    kidney stones?  . Coronary artery calcification seen on CAT scan   . GERD (gastroesophageal reflux disease)   . Hypercholesteremia   . Hypertension   . Hypothyroidism    no meds  . Sleep apnea    never been tested  . Ulcerative colitis     Past Surgical History:  Procedure Laterality Date  . COLONOSCOPY    . CORONARY ARTERY BYPASS GRAFT N/A 08/12/2017   Procedure: CORONARY ARTERY BYPASS GRAFTING (CABG) x six , using left internal mammory artery and right leg greater saphenous vein harvested endoscopically.;  Surgeon: Gaye Pollack, MD;  Location: [facility];  Service: Open Heart Surgery;  Laterality: N/A;  . LEFT HEART CATH AND CORONARY ANGIOGRAPHY N/A 08/09/2017   Procedure: LEFT HEART CATH AND CORONARY ANGIOGRAPHY;  Surgeon: Burnell Blanks, MD;  Location: [facility];  Service: Cardiovascular;  Laterality: N/A;  . SKIN TAG REMOVAL Right 2018   underarm  . TEE WITHOUT CARDIOVERSION N/A 08/12/2017   Procedure:  TRANSESOPHAGEAL ECHOCARDIOGRAM (TEE);  Surgeon: Gaye Pollack, MD;  Location: [facility];  Service: Open Heart Surgery;  Laterality: N/A;     Current Outpatient Medications  Medication Sig Dispense Refill  . acetaminophen (TYLENOL) 325 MG tablet Take 2 tablets (650 mg total) by mouth every 6 (six) hours as needed for mild pain.    Marland Kitchen aspirin EC 81 MG tablet Take 1 tablet (81 mg total) by mouth daily.    Marland Kitchen atorvastatin (LIPITOR) 80 MG tablet Take 1 tablet (80 mg total) by mouth daily at 6 PM. 90 tablet 3  . esomeprazole (NEXIUM) 20 MG capsule Take 20 mg by mouth every morning.     Marland Kitchen levothyroxine (SYNTHROID, LEVOTHROID) 75 MCG tablet TAKE 1 TABLET BY MOUTH ONCE DAILY 90 tablet 0  . mesalamine (ROWASA) 4 g enema Place 60 mLs (4 g total) rectally at bedtime as needed. Place 60 mls rectally at bedtime 1800 mL 6  . metoprolol tartrate (LOPRESSOR) 25 MG tablet Take 1.5 tablets (37.5 mg total) by mouth 2 (two) times daily. 90 tablet 11  . Multiple Vitamin (MULTIVITAMIN) tablet Take 1 tablet by mouth daily.    . nitroGLYCERIN (NITROSTAT) 0.4 MG SL tablet Take 1 tab under your tongue while sitting for chest pain.  If no relief repeat one tab every 5 minutes up to 3 tablets total over 15 mins. 25 tablet 3  . polyethylene glycol (  MIRALAX / GLYCOLAX) packet Take 17 g by mouth daily.     Marland Kitchen sulfaSALAzine (AZULFIDINE) 500 MG tablet TAKE 4 TABLETS BY MOUTH 3  TIMES DAILY 360 tablet 11   No current facility-administered medications for this visit.     Allergies:   Stuart Gentry has no known allergies.    Social History:  The Stuart Gentry  reports that he has never smoked. He has never used smokeless tobacco. He reports current alcohol use. He reports that he does not use drugs.   Family History:  The Stuart Gentry's family history includes Heart disease in his brother; Kidney disease in his brother; Liver cancer in his mother; Lymphoma in his brother.    ROS:  Please see the history of present illness.   Otherwise, review of  systems are positive for none.   All other systems are reviewed and negative.    PHYSICAL EXAM: VS:  BP (!) 150/70   Pulse (!) 50   Ht 5' 11"  (1.803 m)   Wt 96.2 kg   SpO2 97%   BMI 29.57 kg/m  , BMI Body mass index is 29.57 kg/m. Affect appropriate Healthy:  appears stated age 35: normal Neck supple with no adenopathy JVP normal no bruits no thyromegaly Lungs clear with no wheezing and good diaphragmatic motion Heart:  S1/S2 no murmur, no rub, gallop or click PMI normal Post sternotomy well healed  Abdomen: benighn, BS positve, no tenderness, no AAA no bruit.  No HSM or HJR Distal pulses intact with no bruits No edema Neuro non-focal Skin warm and dry No muscular weakness Right SVG harvest     EKG:   SR rate 57 non specific QRS widening LAFB    Recent Labs: 08/13/2017: Magnesium 2.3 09/04/2017: BUN 15; Creatinine, Ser 1.20; Hemoglobin 12.0; Platelets 297; Potassium 4.7; Sodium 141 12/13/2017: TSH 4.71 01/14/2018: ALT 17    Lipid Panel    Component Value Date/Time   CHOL 124 01/14/2018 0836   TRIG 68 01/14/2018 0836   HDL 51 01/14/2018 0836   CHOLHDL 2.4 01/14/2018 0836   CHOLHDL 3.3 06/11/2017 0850   VLDL 20 11/07/2016 0816   LDLCALC 59 01/14/2018 0836   LDLCALC 90 06/11/2017 0850      Wt Readings from Last 3 Encounters:  06/20/18 96.2 kg  12/16/17 93.2 kg  10/28/17 91.6 kg      Other studies Reviewed: Additional studies/ records that were reviewed today include: Notes primary 06/18/17 labs, Abdominal CT ECG.    ASSESSMENT AND PLAN:  1. CAD/CABG 07/2017 improved  No angina continue ASA, statin and beta blocker  2. Dyspnea:  Finished cardiac rehab EF normal by TTE continue weight loss and risk factor modification    4. HLD:  Continue statin  Lab Results  Component Value Date   LDLCALC 59 01/14/2018   5. PAF:  Maintaining NSR off amiodarone improved  6. Thyroid:  On replacement TSH normal f/u with Dr [name]     Current medicines are reviewed  at length with the Stuart Gentry today.  The Stuart Gentry does not have concerns regarding medicines.  The following changes have been made:  no change  Labs/ tests ordered today include: None    Orders Placed This Encounter  Procedures  . Lipid panel  . Hepatic function panel     Disposition:   FU with cardiology  In a year     Signed, Jenkins Rouge, MD  06/20/2018 10:51 AM    [address] [place],  [place]  [address] Phone: [phone]; Fax: [phone]

## 2018-06-20 ENCOUNTER — Ambulatory Visit (INDEPENDENT_AMBULATORY_CARE_PROVIDER_SITE_OTHER): Payer: Medicare Other | Admitting: Cardiovascular Disease

## 2018-06-20 ENCOUNTER — Encounter: Payer: Self-pay | Admitting: Cardiovascular Disease

## 2018-06-20 VITALS — BP 150/70 | HR 50 | Ht 71.0 in | Wt 212.0 lb

## 2018-06-20 DIAGNOSIS — Z951 Presence of aortocoronary bypass graft: Secondary | ICD-10-CM

## 2018-06-20 DIAGNOSIS — E785 Hyperlipidemia, unspecified: Secondary | ICD-10-CM

## 2018-06-20 DIAGNOSIS — I25810 Atherosclerosis of coronary artery bypass graft(s) without angina pectoris: Secondary | ICD-10-CM | POA: Diagnosis not present

## 2018-06-20 DIAGNOSIS — R0600 Dyspnea, unspecified: Secondary | ICD-10-CM | POA: Diagnosis not present

## 2018-06-20 DIAGNOSIS — I480 Paroxysmal atrial fibrillation: Secondary | ICD-10-CM

## 2018-06-20 NOTE — Patient Instructions (Addendum)
Medication Instructions:   If you need a refill on your cardiac medications before your next appointment, please call your pharmacy.   Lab work: Patient will have lab work done at his primary care doctor's office. Fasting lipid and liver panel.  If you have labs (blood work) drawn today and your tests are completely normal, you will receive your results only by: Marland Kitchen MyChart Message (if you have MyChart) OR . A paper copy in the mail If you have any lab test that is abnormal or we need to change your treatment, we will call you to review the results.  Testing/Procedures: None ordered today.  Follow-Up: At [facility], you and your health needs are our priority.  As part of our continuing mission to provide you with exceptional heart care, we have created designated Provider Care Teams.  These Care Teams include your primary Cardiologist (physician) and Advanced Practice Providers (APPs -  Physician Assistants and Nurse Practitioners) who all work together to provide you with the care you need, when you need it. You will need a follow up appointment in 12 months.  Please call our office 2 months in advance to schedule this appointment.  You may see Jenkins Rouge, MD or one of the following Advanced Practice Providers on your designated Care Team:   Truitt Merle, NP Cecilie Kicks, NP . Kathyrn Drown, NP

## 2018-09-02 ENCOUNTER — Other Ambulatory Visit: Payer: Self-pay | Admitting: Gastroenterology

## 2018-09-02 ENCOUNTER — Other Ambulatory Visit: Payer: Self-pay | Admitting: Family Medicine

## 2018-10-17 ENCOUNTER — Encounter: Payer: Self-pay | Admitting: Gastroenterology

## 2018-10-27 ENCOUNTER — Telehealth: Payer: Self-pay | Admitting: Family Medicine

## 2018-10-27 NOTE — Telephone Encounter (Signed)
Spoke to pt's wife and explained that he has not been seen here in our office in over a year and would need an apt. We would draw the same labs that Dr. [name] wants but our lab is not a [facility] owned lab and can only draw labs for our doctors. But we are happy to do that as long as he is seen at least  once a year.

## 2018-10-27 NOTE — Telephone Encounter (Signed)
Patient called in states he needs lab work done. I don't see any orders in the system from Dr. [name]. He said that he was told by his cardiologist to have blood work done when he seen him in February. He will have his cardiologist reach out and let Dr. [name] know what labs he needs. He is scheduled for lab work on 11/04/2018.

## 2018-11-03 ENCOUNTER — Other Ambulatory Visit: Payer: Self-pay | Admitting: *Deleted

## 2018-11-03 DIAGNOSIS — E7800 Pure hypercholesterolemia, unspecified: Secondary | ICD-10-CM

## 2018-11-03 DIAGNOSIS — E039 Hypothyroidism, unspecified: Secondary | ICD-10-CM

## 2018-11-03 DIAGNOSIS — Z125 Encounter for screening for malignant neoplasm of prostate: Secondary | ICD-10-CM

## 2018-11-03 DIAGNOSIS — I10 Essential (primary) hypertension: Secondary | ICD-10-CM

## 2018-11-04 ENCOUNTER — Other Ambulatory Visit: Payer: Self-pay

## 2018-11-04 ENCOUNTER — Other Ambulatory Visit: Payer: Medicare Other

## 2018-11-04 DIAGNOSIS — Z125 Encounter for screening for malignant neoplasm of prostate: Secondary | ICD-10-CM | POA: Diagnosis not present

## 2018-11-04 DIAGNOSIS — I10 Essential (primary) hypertension: Secondary | ICD-10-CM | POA: Diagnosis not present

## 2018-11-04 DIAGNOSIS — E7800 Pure hypercholesterolemia, unspecified: Secondary | ICD-10-CM | POA: Diagnosis not present

## 2018-11-04 DIAGNOSIS — E039 Hypothyroidism, unspecified: Secondary | ICD-10-CM | POA: Diagnosis not present

## 2018-11-05 LAB — COMPLETE METABOLIC PANEL WITH GFR
AG Ratio: 1.7 (calc) (ref 1.0–2.5)
ALT: 18 U/L (ref 9–46)
AST: 18 U/L (ref 10–35)
Albumin: 4 g/dL (ref 3.6–5.1)
Alkaline phosphatase (APISO): 72 U/L (ref 35–144)
BUN: 19 mg/dL (ref 7–25)
CO2: 25 mmol/L (ref 20–32)
Calcium: 9.4 mg/dL (ref 8.6–10.3)
Chloride: 106 mmol/L (ref 98–110)
Creat: 0.98 mg/dL (ref 0.70–1.18)
GFR, Est African American: 88 mL/min/{1.73_m2} (ref >=60)
GFR, Est Non African American: 76 mL/min/{1.73_m2} (ref >=60)
Globulin: 2.3 g/dL (calc) (ref 1.9–3.7)
Glucose, Bld: 104 mg/dL — ABNORMAL HIGH (ref 65–99)
Potassium: 4.6 mmol/L (ref 3.5–5.3)
Sodium: 138 mmol/L (ref 135–146)
Total Bilirubin: 0.6 mg/dL (ref 0.2–1.2)
Total Protein: 6.3 g/dL (ref 6.1–8.1)

## 2018-11-05 LAB — CBC WITH DIFFERENTIAL/PLATELET
Absolute Monocytes: 455 cells/uL (ref 200–950)
Basophils Absolute: 60 cells/uL (ref 0–200)
Basophils Relative: 1.2 %
Eosinophils Absolute: 120 cells/uL (ref 15–500)
Eosinophils Relative: 2.4 %
HCT: 36.4 % — ABNORMAL LOW (ref 38.5–50.0)
Hemoglobin: 12.2 g/dL — ABNORMAL LOW (ref 13.2–17.1)
Lymphs Abs: 1085 cells/uL (ref 850–3900)
MCH: 32.5 pg (ref 27.0–33.0)
MCHC: 33.5 g/dL (ref 32.0–36.0)
MCV: 97.1 fL (ref 80.0–100.0)
MPV: 11.4 fL (ref 7.5–12.5)
Monocytes Relative: 9.1 %
Neutro Abs: 3280 cells/uL (ref 1500–7800)
Neutrophils Relative %: 65.6 %
Platelets: 206 10*3/uL (ref 140–400)
RBC: 3.75 10*6/uL — ABNORMAL LOW (ref 4.20–5.80)
RDW: 11.8 % (ref 11.0–15.0)
Total Lymphocyte: 21.7 %
WBC: 5 10*3/uL (ref 3.8–10.8)

## 2018-11-05 LAB — LIPID PANEL
Cholesterol: 124 mg/dL (ref <200)
HDL: 43 mg/dL (ref >=40)
LDL Cholesterol (Calc): 64 mg/dL (calc)
Non-HDL Cholesterol (Calc): 81 mg/dL (calc) (ref <130)
Total CHOL/HDL Ratio: 2.9 (calc) (ref <5.0)
Triglycerides: 86 mg/dL (ref <150)

## 2018-11-05 LAB — PSA: PSA: 1.7 ng/mL (ref <=4.0)

## 2018-11-05 LAB — TSH: TSH: 5.56 mIU/L — ABNORMAL HIGH (ref 0.40–4.50)

## 2018-11-13 ENCOUNTER — Encounter: Payer: Self-pay | Admitting: Family Medicine

## 2018-11-13 ENCOUNTER — Other Ambulatory Visit: Payer: Self-pay

## 2018-11-13 ENCOUNTER — Ambulatory Visit (INDEPENDENT_AMBULATORY_CARE_PROVIDER_SITE_OTHER): Payer: Medicare Other | Admitting: Family Medicine

## 2018-11-13 ENCOUNTER — Other Ambulatory Visit: Payer: Self-pay | Admitting: Gastroenterology

## 2018-11-13 VITALS — BP 128/64 | HR 50 | Temp 98.4°F | Resp 16 | Ht 71.0 in | Wt 210.0 lb

## 2018-11-13 DIAGNOSIS — Z125 Encounter for screening for malignant neoplasm of prostate: Secondary | ICD-10-CM | POA: Diagnosis not present

## 2018-11-13 DIAGNOSIS — Z951 Presence of aortocoronary bypass graft: Secondary | ICD-10-CM

## 2018-11-13 DIAGNOSIS — Z0000 Encounter for general adult medical examination without abnormal findings: Secondary | ICD-10-CM

## 2018-11-13 DIAGNOSIS — Z0001 Encounter for general adult medical examination with abnormal findings: Secondary | ICD-10-CM | POA: Diagnosis not present

## 2018-11-13 DIAGNOSIS — I10 Essential (primary) hypertension: Secondary | ICD-10-CM | POA: Diagnosis not present

## 2018-11-13 DIAGNOSIS — E7800 Pure hypercholesterolemia, unspecified: Secondary | ICD-10-CM | POA: Diagnosis not present

## 2018-11-13 DIAGNOSIS — E039 Hypothyroidism, unspecified: Secondary | ICD-10-CM | POA: Diagnosis not present

## 2018-11-13 DIAGNOSIS — K5100 Ulcerative (chronic) pancolitis without complications: Secondary | ICD-10-CM

## 2018-11-13 NOTE — Progress Notes (Signed)
Subjective:    Patient ID: Stuart Gentry, male    DOB: [birth year], 73 y.o.   MRN: [id number]  HPI  Patient is here for CPE. Since I last saw the patient, he was found to have severe 3 vessel disease and was referred for CABG with Dr Cyndia Bent on 08/12/17.  Post op course complicated by PAFconverted with amiodarone and no anticoagulation needed   His most recent labs are listed below: Appointment on 11/04/2018  Component Date Value Ref Range Status  . PSA 11/04/2018 1.7  < OR = 4.0 ng/mL Final   Comment: The total PSA value from this assay system is  standardized against the WHO standard. The test  result will be approximately 20% lower when compared  to the equimolar-standardized total PSA (Beckman  Coulter). Comparison of serial PSA results should be  interpreted with this fact in mind. . This test was performed using the Siemens  chemiluminescent method. Values obtained from  different assay methods cannot be used interchangeably. PSA levels, regardless of value, should not be interpreted as absolute evidence of the presence or absence of disease.   Marland Kitchen TSH 11/04/2018 5.56* 0.40 - 4.50 mIU/L Final  . Cholesterol 11/04/2018 124  <200 mg/dL Final  . HDL 11/04/2018 43  > OR = 40 mg/dL Final  . Triglycerides 11/04/2018 86  <150 mg/dL Final  . LDL Cholesterol (Calc) 11/04/2018 64  mg/dL (calc) Final   Comment: Reference range: <100 . Desirable range <100 mg/dL for primary prevention;   <70 mg/dL for patients with CHD or diabetic patients  with > or = 2 CHD risk factors. Marland Kitchen LDL-C is now calculated using the Martin-Hopkins  calculation, which is a validated novel method providing  better accuracy than the Friedewald equation in the  estimation of LDL-C.  Cresenciano Genre et al. Annamaria Helling. 8676;720(94): 2061-2068  (http://education.[employer].com/faq/FAQ164)   . Total CHOL/HDL Ratio 11/04/2018 2.9  <5.0 (calc) Final  . Non-HDL Cholesterol (Calc) 11/04/2018 81  <130 mg/dL (calc) Final    Comment: For patients with diabetes plus 1 major ASCVD risk  factor, treating to a non-HDL-C goal of <100 mg/dL  (LDL-C of <70 mg/dL) is considered a therapeutic  option.   . Glucose, Bld 11/04/2018 104* 65 - 99 mg/dL Final   Comment: .            Fasting reference interval . For someone without known diabetes, a glucose value between 100 and 125 mg/dL is consistent with prediabetes and should be confirmed with a follow-up test. .   . BUN 11/04/2018 19  7 - 25 mg/dL Final  . Creat 11/04/2018 0.98  0.70 - 1.18 mg/dL Final   Comment: For patients >57 years of age, the reference limit for Creatinine is approximately 13% higher for people identified as African-American. .   . GFR, Est Non African American 11/04/2018 76  > OR = 60 mL/min/1.54m Final  . GFR, Est African American 11/04/2018 88  > OR = 60 mL/min/1.755mFinal  . BUN/Creatinine Ratio 0770/96/2836OT APPLICABLE  6 - 22 (calc) Final  . Sodium 11/04/2018 138  135 - 146 mmol/L Final  . Potassium 11/04/2018 4.6  3.5 - 5.3 mmol/L Final  . Chloride 11/04/2018 106  98 - 110 mmol/L Final  . CO2 11/04/2018 25  20 - 32 mmol/L Final  . Calcium 11/04/2018 9.4  8.6 - 10.3 mg/dL Final  . Total Protein 11/04/2018 6.3  6.1 - 8.1 g/dL Final  . Albumin 11/04/2018 4.0  3.6 -  5.1 g/dL Final  . Globulin 11/04/2018 2.3  1.9 - 3.7 g/dL (calc) Final  . AG Ratio 11/04/2018 1.7  1.0 - 2.5 (calc) Final  . Total Bilirubin 11/04/2018 0.6  0.2 - 1.2 mg/dL Final  . Alkaline phosphatase (APISO) 11/04/2018 72  35 - 144 U/L Final  . AST 11/04/2018 18  10 - 35 U/L Final  . ALT 11/04/2018 18  9 - 46 U/L Final  . WBC 11/04/2018 5.0  3.8 - 10.8 Thousand/uL Final  . RBC 11/04/2018 3.75* 4.20 - 5.80 Million/uL Final  . Hemoglobin 11/04/2018 12.2* 13.2 - 17.1 g/dL Final  . HCT 11/04/2018 36.4* 38.5 - 50.0 % Final  . MCV 11/04/2018 97.1  80.0 - 100.0 fL Final  . MCH 11/04/2018 32.5  27.0 - 33.0 pg Final  . MCHC 11/04/2018 33.5  32.0 - 36.0 g/dL Final  . RDW  11/04/2018 11.8  11.0 - 15.0 % Final  . Platelets 11/04/2018 206  140 - 400 Thousand/uL Final  . MPV 11/04/2018 11.4  7.5 - 12.5 fL Final  . Neutro Abs 11/04/2018 3,280  1,500 - 7,800 cells/uL Final  . Lymphs Abs 11/04/2018 1,085  850 - 3,900 cells/uL Final  . Absolute Monocytes 11/04/2018 455  200 - 950 cells/uL Final  . Eosinophils Absolute 11/04/2018 120  15 - 500 cells/uL Final  . Basophils Absolute 11/04/2018 60  0 - 200 cells/uL Final  . Neutrophils Relative % 11/04/2018 65.6  % Final  . Total Lymphocyte 11/04/2018 21.7  % Final  . Monocytes Relative 11/04/2018 9.1  % Final  . Eosinophils Relative 11/04/2018 2.4  % Final  . Basophils Relative 11/04/2018 1.2  % Final   His immunizations are listed below: Immunization History  Administered Date(s) Administered  . Influenza Whole 01/29/2007  . Influenza, High Dose Seasonal PF 02/15/2016  . Influenza,inj,Quad PF,6+ Mos 03/21/2015  . Pneumococcal Conjugate-13 07/07/2013  . Pneumococcal Polysaccharide-23 11/29/2007, 03/21/2015  . Td 04/30/2005   Patient's last colonoscopy was in 2018.  He is not due again till 2021 however he is having more issues with his ulcerative colitis and he has made an appointment to see his gastroenterologist for later this month.  His PSA was recently checked and was found to be well within normal limits.  He is due for the shingles vaccine but otherwise his immunizations are up-to-date.  He denies any other medical problems.  He has not been taking his levothyroxine properly.  He has been taking with other medications and he believes this might be impairing his absorption of levothyroxine and explain his slight elevated TSH. Past Medical History:  Diagnosis Date  . Chronic kidney disease    kidney stones?  . Coronary artery calcification seen on CAT scan   . GERD (gastroesophageal reflux disease)   . Hypercholesteremia   . Hypertension   . Hypothyroidism    no meds  . Sleep apnea    never been tested   . Ulcerative colitis    Past Surgical History:  Procedure Laterality Date  . COLONOSCOPY    . CORONARY ARTERY BYPASS GRAFT N/A 08/12/2017   Procedure: CORONARY ARTERY BYPASS GRAFTING (CABG) x six , using left internal mammory artery and right leg greater saphenous vein harvested endoscopically.;  Surgeon: Gaye Pollack, MD;  [facility];  Service: Open Heart Surgery;  Laterality: N/A;  . LEFT HEART CATH AND CORONARY ANGIOGRAPHY N/A 08/09/2017   Procedure: LEFT HEART CATH AND CORONARY ANGIOGRAPHY;  Surgeon: Burnell Blanks, MD;  Location: [facility];  Service: Cardiovascular;  Laterality: N/A;  . SKIN TAG REMOVAL Right 2018   underarm  . TEE WITHOUT CARDIOVERSION N/A 08/12/2017   Procedure: TRANSESOPHAGEAL ECHOCARDIOGRAM (TEE);  Surgeon: Gaye Pollack, MD;  Location: [facility];  Service: Open Heart Surgery;  Laterality: N/A;   Current Outpatient Medications on File Prior to Visit  Medication Sig Dispense Refill  . acetaminophen (TYLENOL) 325 MG tablet Take 2 tablets (650 mg total) by mouth every 6 (six) hours as needed for mild pain.    Marland Kitchen aspirin EC 81 MG tablet Take 1 tablet (81 mg total) by mouth daily.    Marland Kitchen atorvastatin (LIPITOR) 80 MG tablet Take 1 tablet (80 mg total) by mouth daily at 6 PM. 90 tablet 3  . esomeprazole (NEXIUM) 20 MG capsule Take 20 mg by mouth every morning.     Marland Kitchen levothyroxine (SYNTHROID) 75 MCG tablet Take 1 tablet by mouth once daily 90 tablet 0  . metoprolol tartrate (LOPRESSOR) 25 MG tablet Take 1.5 tablets (37.5 mg total) by mouth 2 (two) times daily. 90 tablet 11  . Multiple Vitamin (MULTIVITAMIN) tablet Take 1 tablet by mouth daily.    . nitroGLYCERIN (NITROSTAT) 0.4 MG SL tablet Take 1 tab under your tongue while sitting for chest pain.  If no relief repeat one tab every 5 minutes up to 3 tablets total over 15 mins. 25 tablet 3  . polyethylene glycol (MIRALAX / GLYCOLAX) packet Take 17 g by mouth daily.     Marland Kitchen sulfaSALAzine (AZULFIDINE) 500 MG  tablet TAKE 4 TABLETS BY MOUTH 3  TIMES DAILY 360 tablet 11   No current facility-administered medications on file prior to visit.    No Known Allergies Social History   Socioeconomic History  . Marital status: Married    Spouse name: Not on file  . Number of children: 3  . Years of education: Not on file  . Highest education level: Not on file  Occupational History  . Occupation: Retired  Scientific laboratory technician  . Financial resource strain: Not on file  . Food insecurity    Worry: Not on file    Inability: Not on file  . Transportation needs    Medical: Not on file    Non-medical: Not on file  Tobacco Use  . Smoking status: Never Smoker  . Smokeless tobacco: Never Used  Substance and Sexual Activity  . Alcohol use: Yes    Comment: 1-2 cans beer monthly  . Drug use: No  . Sexual activity: Not on file  Lifestyle  . Physical activity    Days per week: Not on file    Minutes per session: Not on file  . Stress: Not on file  Relationships  . Social Herbalist on phone: Not on file    Gets together: Not on file    Attends religious service: Not on file    Active member of club or organization: Not on file    Attends meetings of clubs or organizations: Not on file    Relationship status: Not on file  . Intimate partner violence    Fear of current or ex partner: Not on file    Emotionally abused: Not on file    Physically abused: Not on file    Forced sexual activity: Not on file  Other Topics Concern  . Not on file  Social History Narrative  . Not on file     Review of Systems  All other systems reviewed and are negative.      Objective:   Physical Exam  Constitutional: He is oriented to person, place, and time. He appears well-developed and well-nourished. No distress.  HENT:  Head: Normocephalic and atraumatic.  Right Ear: External ear normal.  Left Ear: External ear normal.  Nose: Nose normal.  Mouth/Throat: Oropharynx is clear and moist. No  oropharyngeal exudate.  Eyes: Pupils are equal, round, and reactive to light. Conjunctivae and EOM are normal. Right eye exhibits no discharge. Left eye exhibits no discharge. No scleral icterus.  Neck: Normal range of motion. Neck supple. No JVD present. No tracheal deviation present. No thyromegaly present.  Cardiovascular: Regular rhythm, normal heart sounds and intact distal pulses. Bradycardia present. Exam reveals no gallop and no friction rub.  No murmur heard. Pulmonary/Chest: Effort normal and breath sounds normal. No stridor. No respiratory distress. He has no wheezes. He has no rales. He exhibits no tenderness.  Abdominal: Soft. Bowel sounds are normal. He exhibits no distension and no mass. There is no abdominal tenderness. There is no rebound and no guarding.  Musculoskeletal:        General: No tenderness, deformity or edema.  Lymphadenopathy:    He has no cervical adenopathy.  Neurological: He is alert and oriented to person, place, and time. He has normal reflexes. No cranial nerve deficit. He exhibits normal muscle tone. Coordination normal.  Skin: Skin is warm. No rash noted. He is not diaphoretic. No erythema. No pallor.  Psychiatric: He has a normal mood and affect. His behavior is normal. Judgment and thought content normal.  Vitals reviewed.         Assessment & Plan:  1. General medical exam Colonoscopy is up-to-date.  Prostate cancer screening is up-to-date.  I did recommend the shingles vaccine otherwise his immunizations are up-to-date.  He denies any issues with depression falls memory loss or trouble with ADLs.  2. Essential hypertension Blood pressure is excellent.  3. Prostate cancer screening PSA is well within normal limits.  4. Pure hypercholesterolemia LDL cholesterol is less than 70 which is at goal.  Continue Lipitor 80 mg a day.  5. Hypothyroidism, unspecified type Levothyroxine is subtherapeutic.  Patient will change his medication  administration and recheck a TSH in 6 to 8 weeks.  If still elevated at that time I would increase his levothyroxine to 88 mcg.  6. S/P CABG x 6 Patient is profoundly bradycardic.  I have recommended decreasing his metoprolol to 25 mg twice daily and will notify his cardiologist.

## 2018-11-28 ENCOUNTER — Ambulatory Visit (INDEPENDENT_AMBULATORY_CARE_PROVIDER_SITE_OTHER): Payer: Medicare Other | Admitting: Gastroenterology

## 2018-11-28 ENCOUNTER — Encounter: Payer: Self-pay | Admitting: Gastroenterology

## 2018-11-28 VITALS — Ht 71.0 in | Wt 204.0 lb

## 2018-11-28 DIAGNOSIS — I25810 Atherosclerosis of coronary artery bypass graft(s) without angina pectoris: Secondary | ICD-10-CM

## 2018-11-28 DIAGNOSIS — K51919 Ulcerative colitis, unspecified with unspecified complications: Secondary | ICD-10-CM | POA: Diagnosis not present

## 2018-11-28 MED ORDER — PEG 3350-KCL-NA BICARB-NACL 420 G PO SOLR: 4000.0000 mL | ORAL | 0 refills | Status: DC

## 2018-11-28 NOTE — Progress Notes (Signed)
Review of pertinent gastrointestinal problems:  1. Diagnosed with UC in mid 1980s in [place] bloody diarrhea; Moved to [facility], GI MD Dr. Ladean Raya cared for 1990s; was on prednisone early on in problem. Seems to flare 1-2 per year with urgency, mucous output maybe a bit of blood. Will take rowasa enemas for a month. Chronically on sulfasalazine. He has been getting q two year colonoscopies.Has believes that he has only had a very limited left sided disease. Multiple colonoscopies done over the past 20 years. I reviewed many of them from Dr. Ladean Raya; it is clear that he has had only macroscopic disease in the rectosigmoid colon up to about 25 cm however biopsies of his descending, right colon have also shown changes of chronic inflammatory bowel disease several times. His last colonoscopy outside was January 2013.Colonoscopy 05/2013 [name] terminal ileum was normal. There was clear, moderate inflammation from anus to about 60cm proximal (very indistinct transition to normal mucosa). The right colon was essentially normal appearing. Biopsies showed chronic and active inflammation in left colon; right colon was normal. 06/2012 changed to mesalamine full strength, nightly rowasa. 2015, continued intermittent flares, offered several options for medical management, decided to increase the sulfasalazine to 6 g daily, also mesalamine enemas. Colonoscopy Dr. [name] 10/2016: Normal TI, mild inflammation to 30cm (path quiencent colitis), focal hepatic flexure inflammation (chronic moderately active colitis). Random colon biopsies without dysplasia  2. Upper endoscopy 2013, small 'ulcers in stomach.' Ulcers had healed on nexium daily. Small submucosal lesion in EGD,eventually evaluated at [facility]. DR. Janeice Robinson by EUS, was told it was fatty lesion (lipoma) and it needed no further following.  3. Ischemic colitis (likely) 08/2016: acute abd pain, prominantly inflammed sigmoid segment on CT 08/2016.  This service was provided via virtual  visit.  Both audio and visual were used.   The patient was located at home.  I was located in my office.  The patient did consent to this virtual visit and is aware of possible charges through their insurance for this visit.  The patient is an established patient.  My certified medical assistant, Grace Bushy, contributed to this visit by contacting the patient by phone 1 or 2 business days prior to the appointment and also followed up on the recommendations I made after the visit.  Time spent on virtual visit: 20 minutes   HPI: This is a very pleasant 73 year old man whom I last saw a year or 2 ago.  Since I last saw him he was diagnosed with significant three-vessel coronary artery disease and underwent CABG with Dr. Caffie Pinto April 2019.  Echocardiogram around that time showed an ejection fraction of 55%.   He's been having a bit looser stools than usual. He is having looser than usual.  Not bloody.  No abdominal pains.  He takes rowasa sporadically.   But he has resumed taking it on a daily basis over the past week or 2.  Blood work July 2020 shows hemoglobin 12.2, normal complete metabolic profile.  He has been taking a dose of MiraLAX every day for the past year or 2.  If he misses this he actually will can become a bit constipated.   Chief complaint is longstanding ulcerative colitis  ROS: complete GI ROS as described in HPI, all other review negative.  Constitutional:  No unintentional weight loss   Past Medical History:  Diagnosis Date  . Chronic kidney disease    kidney stones?  . Coronary artery calcification seen on CAT scan   .  GERD (gastroesophageal reflux disease)   . Hypercholesteremia   . Hypertension   . Hypothyroidism    no meds  . Sleep apnea    never been tested  . Ulcerative colitis     Past Surgical History:  Procedure Laterality Date  . COLONOSCOPY    . CORONARY ARTERY BYPASS GRAFT N/A 08/12/2017   Procedure: CORONARY ARTERY BYPASS GRAFTING (CABG) x  six , using left internal mammory artery and right leg greater saphenous vein harvested endoscopically.;  Surgeon: Gaye Pollack, MD;  Location: [facility] OR;  Service: Open Heart Surgery;  Laterality: N/A;  . LEFT HEART CATH AND CORONARY ANGIOGRAPHY N/A 08/09/2017   Procedure: LEFT HEART CATH AND CORONARY ANGIOGRAPHY;  Surgeon: Burnell Blanks, MD;  Location: [facility];  Service: Cardiovascular;  Laterality: N/A;  . SKIN TAG REMOVAL Right 2018   underarm  . TEE WITHOUT CARDIOVERSION N/A 08/12/2017   Procedure: TRANSESOPHAGEAL ECHOCARDIOGRAM (TEE);  Surgeon: Gaye Pollack, MD;  Location: [facility];  Service: Open Heart Surgery;  Laterality: N/A;    Current Outpatient Medications  Medication Sig Dispense Refill  . acetaminophen (TYLENOL) 325 MG tablet Take 2 tablets (650 mg total) by mouth every 6 (six) hours as needed for mild pain.    Marland Kitchen aspirin EC 81 MG tablet Take 1 tablet (81 mg total) by mouth daily.    Marland Kitchen atorvastatin (LIPITOR) 80 MG tablet Take 1 tablet (80 mg total) by mouth daily at 6 PM. 90 tablet 3  . esomeprazole (NEXIUM) 20 MG capsule Take 20 mg by mouth every morning.     Marland Kitchen levothyroxine (SYNTHROID) 75 MCG tablet Take 1 tablet by mouth once daily 90 tablet 0  . mesalamine (ROWASA) 4 g enema INSERT 60 ML RECTALLY AT BEDTIME AS NEEDED 2100 mL 0  . metoprolol tartrate (LOPRESSOR) 25 MG tablet Take 1.5 tablets (37.5 mg total) by mouth 2 (two) times daily. (Patient taking differently: Take 25 mg by mouth 2 (two) times daily. ) 90 tablet 11  . Multiple Vitamin (MULTIVITAMIN) tablet Take 1 tablet by mouth daily.    . nitroGLYCERIN (NITROSTAT) 0.4 MG SL tablet Take 1 tab under your tongue while sitting for chest pain.  If no relief repeat one tab every 5 minutes up to 3 tablets total over 15 mins. 25 tablet 3  . polyethylene glycol (MIRALAX / GLYCOLAX) packet Take 17 g by mouth daily.     Marland Kitchen sulfaSALAzine (AZULFIDINE) 500 MG tablet TAKE 4 TABLETS BY MOUTH 3  TIMES DAILY 360 tablet 11    No current facility-administered medications for this visit.     Allergies as of 11/28/2018  . (No Known Allergies)    Family History  Problem Relation Age of Onset  . Liver cancer Mother   . Heart disease Brother   . Lymphoma Brother   . Kidney disease Brother   . Colon cancer Neg Hx     Social History   Socioeconomic History  . Marital status: Married    Spouse name: Not on file  . Number of children: 3  . Years of education: Not on file  . Highest education level: Not on file  Occupational History  . Occupation: Retired  Scientific laboratory technician  . Financial resource strain: Not on file  . Food insecurity    Worry: Not on file    Inability: Not on file  . Transportation needs    Medical: Not on file    Non-medical: Not on file  Tobacco Use  .  Smoking status: Never Smoker  . Smokeless tobacco: Never Used  Substance and Sexual Activity  . Alcohol use: Yes    Comment: 1-2 cans beer monthly  . Drug use: No  . Sexual activity: Not on file  Lifestyle  . Physical activity    Days per week: Not on file    Minutes per session: Not on file  . Stress: Not on file  Relationships  . Social Herbalist on phone: Not on file    Gets together: Not on file    Attends religious service: Not on file    Active member of club or organization: Not on file    Attends meetings of clubs or organizations: Not on file    Relationship status: Not on file  . Intimate partner violence    Fear of current or ex partner: Not on file    Emotionally abused: Not on file    Physically abused: Not on file    Forced sexual activity: Not on file  Other Topics Concern  . Not on file  Social History Narrative  . Not on file     Physical Exam: Unable to perform because this was a "telemed visit" due to current Covid-19 pandemic  Assessment and plan: 73 y.o. male with longstanding ulcerative colitis  First he has longstanding ulcerative colitis and he is due for high risk screening,  surveillance colonoscopy and we will arrange for that to be done at his soonest convenience.  He is very good about taking his oral colitis medicines and he will continue doing that, sulfasalazine 12 pills daily in divided doses.  He may or may not be having a flare right now.  I did recommend that he continue taking his Rowasa enemas on a nightly basis until he comes in for his colonoscopy.  Oddly he takes MiraLAX on a daily basis as well.  If he skips this or stops it for several days he can become constipated.  This is certainly a bit unusual for ulcerative colitis but not unheard of.  I recommended that he try to stay off of the MiraLAX since he thinks he is having a bit of a flare now.  Please see the "Patient Instructions" section for addition details about the plan.  [name], MD [facility] 11/28/2018, 3:10 PM

## 2018-11-28 NOTE — Patient Instructions (Addendum)
We will arrange a colonoscopy to be performed at his soonest convenience for longstanding ulcerative colitis, screening  He will continue on sulfasalazine 12 pills daily and Rowasa enemas once daily for now.  He will cut back on his MiraLAX for now as well  Thank you for entrusting me with your care and choosing [facility].  Dr [name] .

## 2018-12-06 ENCOUNTER — Other Ambulatory Visit: Payer: Self-pay | Admitting: Family Medicine

## 2018-12-19 ENCOUNTER — Telehealth: Payer: Self-pay | Admitting: Gastroenterology

## 2018-12-19 NOTE — Telephone Encounter (Signed)
Spoke with patient's wife regarding Covid-19 screening questions. Covid-19 Screening Questions:  Do you now or have you had a fever in the last 14 days? no  Do you have any respiratory symptoms of shortness of breath or cough now or in the last 14 days? no  Do you have any family members or close contacts with diagnosed or suspected Covid-19 in the past 14 days? no  Have you been tested for Covid-19 and found to be positive? no  Pt made aware of that care partner may wait in the car or come up to the lobby during the procedure but will need to provide their own mask.

## 2018-12-22 ENCOUNTER — Other Ambulatory Visit: Payer: Self-pay

## 2018-12-22 ENCOUNTER — Ambulatory Visit: Payer: Medicare Other | Admitting: Gastroenterology

## 2018-12-22 ENCOUNTER — Encounter: Payer: Self-pay | Admitting: Gastroenterology

## 2018-12-22 VITALS — BP 131/57 | HR 50 | Temp 97.1°F | Resp 14 | Ht 71.0 in | Wt 204.0 lb

## 2018-12-22 DIAGNOSIS — Z8719 Personal history of other diseases of the digestive system: Secondary | ICD-10-CM

## 2018-12-22 DIAGNOSIS — I2510 Atherosclerotic heart disease of native coronary artery without angina pectoris: Secondary | ICD-10-CM | POA: Diagnosis not present

## 2018-12-22 DIAGNOSIS — K529 Noninfective gastroenteritis and colitis, unspecified: Secondary | ICD-10-CM | POA: Diagnosis not present

## 2018-12-22 DIAGNOSIS — E669 Obesity, unspecified: Secondary | ICD-10-CM | POA: Diagnosis not present

## 2018-12-22 DIAGNOSIS — I10 Essential (primary) hypertension: Secondary | ICD-10-CM | POA: Diagnosis not present

## 2018-12-22 DIAGNOSIS — K219 Gastro-esophageal reflux disease without esophagitis: Secondary | ICD-10-CM | POA: Diagnosis not present

## 2018-12-22 DIAGNOSIS — K5190 Ulcerative colitis, unspecified, without complications: Secondary | ICD-10-CM | POA: Diagnosis not present

## 2018-12-22 MED ORDER — SODIUM CHLORIDE 0.9 % IV SOLN: 500.0000 mL | Freq: Once | INTRAVENOUS | Status: DC

## 2018-12-22 NOTE — Patient Instructions (Signed)
   CONTINUE SULFASALAZINE 4 PILLS TID AND DAILY ENEMA FOR NOW  AWAIT PATHOLOGY RESULTS.    YOU HAD AN ENDOSCOPIC PROCEDURE TODAY AT [facility]:   Refer to the procedure report that was given to you for any specific questions about what was found during the examination.  If the procedure report does not answer your questions, please call your gastroenterologist to clarify.  If you requested that your care partner not be given the details of your procedure findings, then the procedure report has been included in a sealed envelope for you to review at your convenience later.  YOU SHOULD EXPECT: Some feelings of bloating in the abdomen. Passage of more gas than usual.  Walking can help get rid of the air that was put into your GI tract during the procedure and reduce the bloating. If you had a lower endoscopy (such as a colonoscopy or flexible sigmoidoscopy) you may notice spotting of blood in your stool or on the toilet paper. If you underwent a bowel prep for your procedure, you may not have a normal bowel movement for a few days.  Please Note:  You might notice some irritation and congestion in your nose or some drainage.  This is from the oxygen used during your procedure.  There is no need for concern and it should clear up in a day or so.  SYMPTOMS TO REPORT IMMEDIATELY:   Following lower endoscopy (colonoscopy or flexible sigmoidoscopy):  Excessive amounts of blood in the stool  Significant tenderness or worsening of abdominal pains  Swelling of the abdomen that is new, acute  Fever of 100F or higher   For urgent or emergent issues, a gastroenterologist can be reached at any hour by calling [phone].   DIET:  We do recommend a small meal at first, but then you may proceed to your regular diet.  Drink plenty of fluids but you should avoid alcoholic beverages for 24 hours.  ACTIVITY:  You should plan to take it easy for the rest of today and you should NOT DRIVE  or use heavy machinery until tomorrow (because of the sedation medicines used during the test).    FOLLOW UP: Our staff will call the number listed on your records 48-72 hours following your procedure to check on you and address any questions or concerns that you may have regarding the information given to you following your procedure. If we do not reach you, we will leave a message.  We will attempt to reach you two times.  During this call, we will ask if you have developed any symptoms of COVID 19. If you develop any symptoms (ie: fever, flu-like symptoms, shortness of breath, cough etc.) before then, please call [phone].  If you test positive for Covid 19 in the 2 weeks post procedure, please call and report this information to [place].    If any biopsies were taken you will be contacted by phone or by letter within the next 1-3 weeks.  Please call us at [phone] if you have not heard about the biopsies in 3 weeks.    SIGNATURES/CONFIDENTIALITY: You and/or your care partner have signed paperwork which will be entered into your electronic medical record.  These signatures attest to the fact that that the information above on your After Visit Summary has been reviewed and is understood.  Full responsibility of the confidentiality of this discharge information lies with you and/or your care-partner.

## 2018-12-22 NOTE — Progress Notes (Signed)
Stuart Gentry  Vital signs-[facility]ney [place]

## 2018-12-22 NOTE — Progress Notes (Signed)
Report to PACU, RN, vss, BBS= Clear.  

## 2018-12-22 NOTE — Progress Notes (Signed)
Called to room to assist during endoscopic procedure.  Patient ID and intended procedure confirmed with present staff. Received instructions for my participation in the procedure from the performing physician.  

## 2018-12-22 NOTE — Op Note (Signed)
[facility] Patient Name: Stuart Gentry Procedure Date: 12/22/2018 8:53 AM MRN: [id number] Endoscopist: Milus Banister , MD Age: 73 Referring MD:  Date of Birth: 1945/10/12 Gender: Male Account #: [id number] Procedure:                Colonoscopy Indications:              High risk screening given long standing Ulcerative                            colitis Medicines:                Monitored Anesthesia Care Procedure:                Pre-Anesthesia Assessment:                           - Prior to the procedure, a History and Physical                            was performed, and patient medications and                            allergies were reviewed. The patient's tolerance of                            previous anesthesia was also reviewed. The risks                            and benefits of the procedure and the sedation                            options and risks were discussed with the patient.                            All questions were answered, and informed consent                            was obtained. Prior Anticoagulants: The patient has                            taken no previous anticoagulant or antiplatelet                            agents. ASA Grade Assessment: II - A patient with                            mild systemic disease. After reviewing the risks                            and benefits, the patient was deemed in                            satisfactory condition to undergo the procedure.  After obtaining informed consent, the colonoscope                            was passed under direct vision. Throughout the                            procedure, the patient's blood pressure, pulse, and                            oxygen saturations were monitored continuously. The                            Colonoscope was introduced through the anus and                            advanced to the the terminal ileum. The colonoscopy                         was performed without difficulty. The patient                            tolerated the procedure well. The quality of the                            bowel preparation was good. The terminal ileum,                            ileocecal valve, appendiceal orifice, and rectum                            were photographed. Scope In: 9:09:00 AM Scope Out: 9:29:29 AM Scope Withdrawal Time: 0 hours 9 minutes 24 seconds  Total Procedure Duration: 0 hours 20 minutes 29 seconds  Findings:                 The terminal ileum appeared normal.                           The distal colon was mildly inflamed (erythema and                            loss of normal vascular pattern). There was a                            fairly discrete transition from abnormal to normal                            colon mucosa at around 40cm from the anus. The                            colon mucosa was sampled extensively (2 jars,                            proximal and distal to the 40cm line of  demarcation).                           The exam was otherwise without abnormality on                            direct and retroflexion views. Complications:            No immediate complications. Estimated blood loss:                            None. Estimated Blood Loss:     Estimated blood loss: none. Impression:               - The examined portion of the ileum was normal.                           - The distal colon was mildly inflamed (erythema                            and loss of normal vascular pattern). There was a                            fairly discrete transition from abnormal to normal                            colon mucosa at around 40cm from the anus. The                            colon mucosa was sampled extensively (2 jars,                            proximal and distal to the 40cm line of                            demarcation).                           -  The examination was otherwise normal on direct                            and retroflexion views. Recommendation:           - Patient has a contact number available for                            emergencies. The signs and symptoms of potential                            delayed complications were discussed with the                            patient. Return to normal activities tomorrow.                            Written discharge instructions were provided to  the                            patient.                           - Resume previous diet.                           - Continue present medications.                           - Await final path for further recommendations.                            Continue 4 pills TID sulfasalazine and daily enemas                            for now. Milus Banister, MD 12/22/2018 9:36:20 AM This report has been signed electronically.

## 2018-12-23 ENCOUNTER — Other Ambulatory Visit: Payer: Self-pay | Admitting: Gastroenterology

## 2018-12-23 ENCOUNTER — Other Ambulatory Visit: Payer: Self-pay | Admitting: Cardiovascular Disease

## 2018-12-23 DIAGNOSIS — K5100 Ulcerative (chronic) pancolitis without complications: Secondary | ICD-10-CM

## 2018-12-24 ENCOUNTER — Telehealth: Payer: Self-pay

## 2018-12-24 NOTE — Telephone Encounter (Signed)
  Follow up Call-  Call back number 12/22/2018 11/27/2016  Post procedure Call Back phone  # [phone]  Permission to leave phone message Yes Yes  Some recent data might be hidden     Patient questions:  Do you have a fever, pain , or abdominal swelling? No. Pain Score  0 *  Have you tolerated food without any problems? Yes.    Have you been able to return to your normal activities? Yes.    Do you have any questions about your discharge instructions: Diet   No. Medications  No. Follow up visit  No.  Do you have questions or concerns about your Care? No.  Actions: * If pain score is 4 or above: 1. No action needed, pain <4.Have you developed a fever since your procedure? no  2.   Have you had an respiratory symptoms (SOB or cough) since your procedure? no  3.   Have you tested positive for COVID 19 since your procedure no  4.   Have you had any family members/close contacts diagnosed with the COVID 19 since your procedure?  no   If yes to any of these questions please route to Joylene Kasra, RN and Alphonsa Gin, Therapist, sports.

## 2019-01-05 ENCOUNTER — Other Ambulatory Visit: Payer: Self-pay | Admitting: Cardiovascular Disease

## 2019-01-27 ENCOUNTER — Other Ambulatory Visit: Payer: Self-pay | Admitting: Family Medicine

## 2019-01-27 ENCOUNTER — Other Ambulatory Visit: Payer: Self-pay

## 2019-01-27 ENCOUNTER — Other Ambulatory Visit: Payer: Medicare Other

## 2019-01-27 DIAGNOSIS — E039 Hypothyroidism, unspecified: Secondary | ICD-10-CM

## 2019-01-27 LAB — TSH: TSH: 2.2 mIU/L (ref 0.40–4.50)

## 2019-01-28 DIAGNOSIS — Z23 Encounter for immunization: Secondary | ICD-10-CM | POA: Diagnosis not present

## 2019-02-05 ENCOUNTER — Other Ambulatory Visit: Payer: Self-pay | Admitting: Gastroenterology

## 2019-02-05 DIAGNOSIS — K5100 Ulcerative (chronic) pancolitis without complications: Secondary | ICD-10-CM

## 2019-02-14 ENCOUNTER — Other Ambulatory Visit: Payer: Self-pay | Admitting: Gastroenterology

## 2019-03-25 ENCOUNTER — Other Ambulatory Visit: Payer: Self-pay

## 2019-03-31 ENCOUNTER — Other Ambulatory Visit: Payer: Self-pay

## 2019-03-31 ENCOUNTER — Other Ambulatory Visit: Payer: Self-pay | Admitting: Gastroenterology

## 2019-03-31 NOTE — Telephone Encounter (Signed)
Received fax today and I see someone else has already refilled his sulfasalazine so will disregard this fax.

## 2019-04-19 ENCOUNTER — Other Ambulatory Visit: Payer: Self-pay | Admitting: Gastroenterology

## 2019-04-19 DIAGNOSIS — K5100 Ulcerative (chronic) pancolitis without complications: Secondary | ICD-10-CM

## 2019-06-04 ENCOUNTER — Other Ambulatory Visit: Payer: Self-pay | Admitting: Family Medicine

## 2019-06-08 ENCOUNTER — Other Ambulatory Visit: Payer: Self-pay | Admitting: *Deleted

## 2019-06-08 MED ORDER — LEVOTHYROXINE SODIUM 75 MCG PO TABS: 75.0000 ug | ORAL_TABLET | Freq: Every day | ORAL | 1 refills | Status: DC

## 2019-06-19 ENCOUNTER — Telehealth: Payer: Self-pay

## 2019-06-19 NOTE — Telephone Encounter (Signed)
Patient is going to try Mychart if it does not work he will just have a phone call.   Virtual Visit Pre-Appointment Phone Call  "(Name), I am calling you today to discuss your upcoming appointment. We are currently trying to limit exposure to the virus that causes COVID-19 by seeing patients at home rather than in the office."  1. "What is the BEST phone number to call the day of the visit?" - include this in appointment notes  2. "Do you have or have access to (through a family member/friend) a smartphone with video capability that we can use for your visit?" a. If yes - list this number in appt notes as "cell" (if different from BEST phone #) and list the appointment type as a VIDEO visit in appointment notes b. If no - list the appointment type as a PHONE visit in appointment notes  3. Confirm consent - "In the setting of the current Covid19 crisis, you are scheduled for a (phone or video) visit with your provider on (date) at (time).  Just as we do with many in-office visits, in order for you to participate in this visit, we must obtain consent.  If you'd like, I can send this to your mychart (if signed up) or email for you to review.  Otherwise, I can obtain your verbal consent now.  All virtual visits are billed to your insurance company just like a normal visit would be.  By agreeing to a virtual visit, we'd like you to understand that the technology does not allow for your provider to perform an examination, and thus may limit your provider's ability to fully assess your condition. If your provider identifies any concerns that need to be evaluated in person, we will make arrangements to do so.  Finally, though the technology is pretty good, we cannot assure that it will always work on either your or our end, and in the setting of a video visit, we may have to convert it to a phone-only visit.  In either situation, we cannot ensure that we have a secure connection.  Are you willing to proceed?  Yes  4. Advise patient to be prepared - "Two hours prior to your appointment, go ahead and check your blood pressure, pulse, oxygen saturation, and your weight (if you have the equipment to check those) and write them all down. When your visit starts, your provider will ask you for this information. If you have an Apple Watch or Kardia device, please plan to have heart rate information ready on the day of your appointment. Please have a pen and paper handy nearby the day of the visit as well."  5. Give patient instructions for MyChart download to smartphone OR Doximity/Doxy.me as below if video visit (depending on what platform provider is using)  6. Inform patient they will receive a phone call 15 minutes prior to their appointment time (may be from unknown caller ID) so they should be prepared to answer    TELEPHONE CALL NOTE  Stuart Gentry has been deemed a candidate for a follow-up tele-health visit to limit community exposure during the Covid-19 pandemic. I spoke with the patient via phone to ensure availability of phone/video source, confirm preferred email & phone number, and discuss instructions and expectations.  I reminded Stuart Gentry to be prepared with any vital sign and/or heart rhythm information that could potentially be obtained via home monitoring, at the time of his visit. I reminded Stuart Gentry to  expect a phone call prior to his visit.  Stuart Barter, RN 06/19/2019 12:31 PM   INSTRUCTIONS FOR DOWNLOADING THE MYCHART APP TO SMARTPHONE  - The patient must first make sure to have activated MyChart and know their login information - If Apple, go to [employer] and type in MyChart in the search bar and download the app. If Android, ask patient to go to [employer] and type in [place] in the search bar and download the app. The app is free but as with any other app downloads, their phone may require them to verify saved payment information or Apple/Android  password.  - The patient will need to then log into the app with their MyChart username and password, and select [facility] as their healthcare provider to link the account. When it is time for your visit, go to the MyChart app, find appointments, and click Begin Video Visit. Be sure to Select Allow for your device to access the Microphone and Camera for your visit. You will then be connected, and your provider will be with you shortly.  **If they have any issues connecting, or need assistance please contact MyChart service desk [phone])**  **If using a computer, in order to ensure the best quality for their visit they will need to use either of the following Internet Browsers: [employer], or Google Chrome**  IF USING DOXIMITY or DOXY.ME - The patient will receive a link just prior to their visit by text.     FULL LENGTH CONSENT FOR TELE-HEALTH VISIT   I hereby voluntarily request, consent and authorize [facility] and its employed or contracted physicians, physician assistants, nurse practitioners or other licensed health care professionals (the Practitioner), to provide me with telemedicine health care services (the "Services") as deemed necessary by the treating Practitioner. I acknowledge and consent to receive the Services by the Practitioner via telemedicine. I understand that the telemedicine visit will involve communicating with the Practitioner through live audiovisual communication technology and the disclosure of certain medical information by electronic transmission. I acknowledge that I have been given the opportunity to request an in-person assessment or other available alternative prior to the telemedicine visit and am voluntarily participating in the telemedicine visit.  I understand that I have the right to withhold or withdraw my consent to the use of telemedicine in the course of my care at any time, without affecting my right to future care or treatment,  and that the Practitioner or I may terminate the telemedicine visit at any time. I understand that I have the right to inspect all information obtained and/or recorded in the course of the telemedicine visit and may receive copies of available information for a reasonable fee.  I understand that some of the potential risks of receiving the Services via telemedicine include:  Marland Kitchen Delay or interruption in medical evaluation due to technological equipment failure or disruption; . Information transmitted may not be sufficient (e.g. poor resolution of images) to allow for appropriate medical decision making by the Practitioner; and/or  . In rare instances, security protocols could fail, causing a breach of personal health information.  Furthermore, I acknowledge that it is my responsibility to provide information about my medical history, conditions and care that is complete and accurate to the best of my ability. I acknowledge that Practitioner's advice, recommendations, and/or decision may be based on factors not within their control, such as incomplete or inaccurate data provided by me or distortions of diagnostic images or specimens  that may result from electronic transmissions. I understand that the practice of medicine is not an exact science and that Practitioner makes no warranties or guarantees regarding treatment outcomes. I acknowledge that I will receive a copy of this consent concurrently upon execution via email to the email address I last provided but may also request a printed copy by calling the office of [facility].    I understand that my insurance will be billed for this visit.   I have read or had this consent read to me. . I understand the contents of this consent, which adequately explains the benefits and risks of the Services being provided via telemedicine.  . I have been provided ample opportunity to ask questions regarding this consent and the Services and have had my questions  answered to my satisfaction. . I give my informed consent for the services to be provided through the use of telemedicine in my medical care  By participating in this telemedicine visit I agree to the above.

## 2019-06-19 NOTE — Progress Notes (Signed)
Virtual Visit via Video Note   This visit type was conducted due to national recommendations for restrictions regarding the COVID-19 Pandemic (e.g. social distancing) in an effort to limit this patient's exposure and mitigate transmission in our community.  Due to her co-morbid illnesses, this patient is at least at moderate risk for complications without adequate follow up.  This format is felt to be most appropriate for this patient at this time.  All issues noted in this document were discussed and addressed.  A limited physical exam was performed with this format.  Please refer to the patient's chart for her consent to telehealth for [facility].   Patient Location: Home Physician Location Office   Cardiology Office Note   Date:  06/23/2019   ID:  Stuart Gentry, Stuart Gentry 1946/03/06, MRN [id number]  PCP:  [name], MD  Cardiologist:   Jenkins Rouge, MD   History of Present Illness:  74 y.o. seen initially 07/2017 for chest pain. CRF;s include HTN, HLD.  Retired from [employer]  Wife indicates he watches tv tood much and sedentary   TTE done 08/10/17 EF 55-60% Trivial AR normal estimated PA pressure Stress testing positive and had cath with CM on 08/09/17 Films reviewed  CTO mid LAD with severe 3 VD Referred for CABG with Dr Cyndia Bent on 08/12/17 LIMA to LAD SVG sequential D1/D2 Sequential graft to OM1/OM3 and SVG PDA Post op course complicated by PAFconverted with amiodaroe and no anticoagulation needed  D/C with amiodarone 200 bid stopped last visit with PA   Not going to [facility] due to [place] with his grandson also  No angina Compliant with meds  Both he and wife had vaccines earlier this month     Past Medical History:  Diagnosis Date  . Chronic kidney disease    kidney stones?  . Coronary artery calcification seen on CAT scan   . GERD (gastroesophageal reflux disease)   . Hypercholesteremia   . Hypertension   . Hypothyroidism    no meds  . Myocardial infarction ([place])    . Sleep apnea    never been tested  . Ulcerative colitis     Past Surgical History:  Procedure Laterality Date  . COLONOSCOPY    . CORONARY ARTERY BYPASS GRAFT N/A 08/12/2017   Procedure: CORONARY ARTERY BYPASS GRAFTING (CABG) x six , using left internal mammory artery and right leg greater saphenous vein harvested endoscopically.;  Surgeon: Gaye Pollack, MD;  Location: [facility] OR;  Service: Open Heart Surgery;  Laterality: N/A;  . LEFT HEART CATH AND CORONARY ANGIOGRAPHY N/A 08/09/2017   Procedure: LEFT HEART CATH AND CORONARY ANGIOGRAPHY;  Surgeon: Burnell Blanks, MD;  Location: [facility];  Service: Cardiovascular;  Laterality: N/A;  . SKIN TAG REMOVAL Right 2018   underarm  . TEE WITHOUT CARDIOVERSION N/A 08/12/2017   Procedure: TRANSESOPHAGEAL ECHOCARDIOGRAM (TEE);  Surgeon: Gaye Pollack, MD;  Location: [facility];  Service: Open Heart Surgery;  Laterality: N/A;     Current Outpatient Medications  Medication Sig Dispense Refill  . acetaminophen (TYLENOL) 325 MG tablet Take 2 tablets (650 mg total) by mouth every 6 (six) hours as needed for mild pain.    Marland Kitchen aspirin EC 81 MG tablet Take 1 tablet (81 mg total) by mouth daily.    Marland Kitchen atorvastatin (LIPITOR) 80 MG tablet TAKE 1 TABLET BY MOUTH ONCE DAILY AT  6PM 90 tablet 1  . esomeprazole (NEXIUM) 20 MG capsule Take 20 mg by mouth every  morning.     Marland Kitchen levothyroxine (EUTHYROX) 75 MCG tablet Take 1 tablet (75 mcg total) by mouth daily. 90 tablet 1  . mesalamine (ROWASA) 4 g enema INSERT 60 ML RECTALLY  AT BEDTIME AS NEEDED 2100 mL 0  . metoprolol tartrate (LOPRESSOR) 25 MG tablet Take 25 mg by mouth 2 (two) times daily.    . Multiple Vitamin (MULTIVITAMIN) tablet Take 1 tablet by mouth daily.    . nitroGLYCERIN (NITROSTAT) 0.4 MG SL tablet Take 1 tab under your tongue while sitting for chest pain.  If no relief repeat one tab every 5 minutes up to 3 tablets total over 15 mins. 25 tablet 3  . sulfaSALAzine (AZULFIDINE) 500 MG  tablet TAKE 4 TABLETS BY MOUTH 3  TIMES DAILY 480 tablet 8  . polyethylene glycol (MIRALAX / GLYCOLAX) packet Take 17 g by mouth daily.      No current facility-administered medications for this visit.    Allergies:   Patient has no known allergies.    Social History:  The patient  reports that he has never smoked. He has never used smokeless tobacco. He reports current alcohol use. He reports that he does not use drugs.   Family History:  The patient's family history includes Esophageal cancer in his brother; Heart disease in his brother; Kidney disease in his brother; Liver cancer in his mother; Lymphoma in his brother.    ROS:  Please see the history of present illness.   Otherwise, review of systems are positive for none.   All other systems are reviewed and negative.    PHYSICAL EXAM: VS:  BP (!) 153/75   Pulse (!) 56   Ht 5' 11"  (1.803 m)   Wt 205 lb (93 kg)   BMI 28.59 kg/m  , BMI Body mass index is 28.59 kg/m.  No distress JVP normal  Skin warm and dry No edema No tachycpnea    EKG:   SR rate 57 non specific QRS widening LAFB    Recent Labs: 11/04/2018: ALT 18; BUN 19; Creat 0.98; Hemoglobin 12.2; Platelets 206; Potassium 4.6; Sodium 138 01/27/2019: TSH 2.20    Lipid Panel    Component Value Date/Time   CHOL 124 11/04/2018 0806   CHOL 124 01/14/2018 0836   TRIG 86 11/04/2018 0806   HDL 43 11/04/2018 0806   HDL 51 01/14/2018 0836   CHOLHDL 2.9 11/04/2018 0806   VLDL 20 11/07/2016 0816   LDLCALC 64 11/04/2018 0806      Wt Readings from Last 3 Encounters:  06/23/19 205 lb (93 kg)  12/22/18 204 lb (92.5 kg)  11/28/18 204 lb (92.5 kg)      Other studies Reviewed: Additional studies/ records that were reviewed today include: Notes primary 06/18/17 labs, Abdominal CT ECG.    ASSESSMENT AND PLAN:  1. CAD/CABG 07/2017 improved  No angina continue ASA, statin and beta blocker  2. Dyspnea:  Finished cardiac rehab EF normal by TTE continue weight loss and  risk factor modification    4. HLD:  Continue statin LDL at goal 64 on 11/04/18 labs with primary  5. PAF:  Maintaining NSR off amiodarone improved  6. Thyroid:  On replacement TSH normal f/u with Dr [name]  7. Ulcerative Colitis :  Long standing Colonoscopy Dr [name] 11/2018 benign f/u GI    Current medicines are reviewed at length with the patient today.  The patient does not have concerns regarding medicines.  The following changes have been made:  no change  Labs/ tests ordered today include: None    No orders of the defined types were placed in this encounter.  Time: spent reviewing chart CABG report direct patient interview and writing note 20 minutes    Disposition:   FU with cardiology  In a year      Signed, Jenkins Rouge, MD  06/23/2019 8:29 AM    [facility] [place], [place], [place]  [address] Phone: [phone]; Fax: [phone]

## 2019-06-23 ENCOUNTER — Telehealth (INDEPENDENT_AMBULATORY_CARE_PROVIDER_SITE_OTHER): Payer: Medicare Other | Admitting: Cardiovascular Disease

## 2019-06-23 VITALS — BP 153/75 | HR 56 | Ht 71.0 in | Wt 205.0 lb

## 2019-06-23 DIAGNOSIS — Z951 Presence of aortocoronary bypass graft: Secondary | ICD-10-CM

## 2019-06-23 NOTE — Patient Instructions (Addendum)
Medication Instructions:   *If you need a refill on your cardiac medications before your next appointment, please call your pharmacy*  Lab Work:  If you have labs (blood work) drawn today and your tests are completely normal, you will receive your results only by: Marland Kitchen MyChart Message (if you have MyChart) OR . A paper copy in the mail If you have any lab test that is abnormal or we need to change your treatment, we will call you to review the results.  Follow-Up: At [facility], you and your health needs are our priority.  As part of our continuing mission to provide you with exceptional heart care, we have created designated Provider Care Teams.  These Care Teams include your primary Cardiologist (physician) and Advanced Practice Providers (APPs -  Physician Assistants and Nurse Practitioners) who all work together to provide you with the care you need, when you need it.  Your next appointment:   12 month(s)  The format for your next appointment:   In Person  Provider:   You may see Jenkins Rouge, MD or one of the following Advanced Practice Providers on your designated Care Team:    Truitt Merle, NP  Cecilie Kicks, NP  Kathyrn Drown, NP

## 2019-07-13 ENCOUNTER — Other Ambulatory Visit: Payer: Self-pay | Admitting: Cardiovascular Disease

## 2019-08-26 ENCOUNTER — Telehealth: Payer: Self-pay | Admitting: Family Medicine

## 2019-08-26 NOTE — Chronic Care Management (AMB) (Signed)
  Chronic Care Management   Outreach Note  08/26/2019 Name: Taequan Stockhausen MRN: [id number] DOB: [birth year]  Referred by: [name], MD Reason for referral : Chronic Care Management   An unsuccessful telephone outreach was attempted today. The patient was referred to the pharmacist for assistance with care management and care coordination.   Follow Up Plan:   [place]

## 2019-10-03 ENCOUNTER — Other Ambulatory Visit: Payer: Self-pay | Admitting: Cardiovascular Disease

## 2019-10-07 ENCOUNTER — Telehealth: Payer: Self-pay | Admitting: Family Medicine

## 2019-10-07 NOTE — Progress Notes (Signed)
  Chronic Care Management   Note  10/07/2019 Name: Stuart Gentry MRN: [id number] DOB: 04-10-1946  Stuart Gentry is a 74 y.o. year old male who is a primary care patient of [name], MD. I reached out to Tori Milks by phone today in response to a referral sent by Stuart Gentry's PCP, [name], MD.   Stuart Gentry was given information about Chronic Care Management services today including:  1. CCM service includes personalized support from designated clinical staff supervised by his physician, including individualized plan of care and coordination with other care providers 2. 24/7 contact phone numbers for assistance for urgent and routine care needs. 3. Service will only be billed when office clinical staff spend 20 minutes or more in a month to coordinate care. 4. Only one practitioner may furnish and bill the service in a calendar month. 5. The patient may stop CCM services at any time (effective at the end of the month) by phone call to the office staff.   Patient agreed to services and verbal consent obtained.   Follow up plan:  [place]

## 2019-10-14 ENCOUNTER — Other Ambulatory Visit: Payer: Self-pay

## 2019-10-14 DIAGNOSIS — K5100 Ulcerative (chronic) pancolitis without complications: Secondary | ICD-10-CM

## 2019-10-14 MED ORDER — MESALAMINE 4 G RE ENEM: ENEMA | RECTAL | 1 refills | Status: DC

## 2019-10-15 ENCOUNTER — Other Ambulatory Visit: Payer: Self-pay | Admitting: Gastroenterology

## 2019-10-15 DIAGNOSIS — K5100 Ulcerative (chronic) pancolitis without complications: Secondary | ICD-10-CM

## 2019-11-30 ENCOUNTER — Other Ambulatory Visit: Payer: Self-pay | Admitting: Family Medicine

## 2019-11-30 NOTE — Chronic Care Management (AMB) (Addendum)
Chronic Care Management Pharmacy  Name: Stuart Gentry  MRN: [id number] DOB: [birth year]   Chief Complaint/ HPI  Stuart Gentry,  74 y.o. , male presents for their Initial CCM visit with the clinical pharmacist via telephone.  PCP : [name], MD  Their chronic conditions include: hypertension, CAD, ulcerative colitis, hypothyroidism, hypercholesterolemia.  Office Visits:  11/13/2018 [name]) - having issues with UC Levothyroxine was subtherapeutic so changed administration and recheck in 6 weeks Upon recheck was normal so no changes  Consult Visit: 11/28/2018 [name], Gertie Fey) -  Takes miralax daily or he will be constipated Possible UC flare so d/c it No med changes recommended colonoscopy  06/23/2019 [name], Cardio) - follow up Denies angina, continues ASA, statin, and beta blocker Long standing UC  Medications: Outpatient Encounter Medications as of 12/01/2019  Medication Sig   acetaminophen (TYLENOL) 325 MG tablet Take 2 tablets (650 mg total) by mouth every 6 (six) hours as needed for mild pain.   aspirin EC 81 MG tablet Take 1 tablet (81 mg total) by mouth daily.   atorvastatin (LIPITOR) 80 MG tablet TAKE 1 TABLET BY MOUTH ONCE DAILY AT  6PM   esomeprazole (NEXIUM) 20 MG capsule Take 20 mg by mouth every morning.    levothyroxine (EUTHYROX) 75 MCG tablet Take 1 tablet (75 mcg total) by mouth daily.   mesalamine (ROWASA) 4 g enema INSERT 60 ML RECTALLY  AT BEDTIME AS NEEDED   metoprolol tartrate (LOPRESSOR) 25 MG tablet TAKE 1 & 1/2 (ONE & ONE-HALF) TABLETS BY MOUTH TWICE DAILY (Patient taking differently: 25 mg 2 (two) times daily. )   Multiple Vitamin (MULTIVITAMIN) tablet Take 1 tablet by mouth daily.   nitroGLYCERIN (NITROSTAT) 0.4 MG SL tablet Take 1 tab under your tongue while sitting for chest pain.  If no relief repeat one tab every 5 minutes up to 3 tablets total over 15 mins.   sulfaSALAzine (AZULFIDINE) 500 MG tablet TAKE 4 TABLETS BY MOUTH 3   TIMES DAILY   polyethylene glycol (MIRALAX / GLYCOLAX) packet Take 17 g by mouth daily.    No facility-administered encounter medications on file as of 12/01/2019.     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   Hypertension BP Readings from Last 3 Encounters:  06/23/19 (!) 153/75  12/22/18 (!) 131/57  11/13/18 128/64   Pharmacist Clinical Goal(s): Over the next 180 days, patient will work with PharmD and providers to achieve BP goal <130/80 Current regimen:  Metoprolol tartrate 42m one tablet po bid Interventions: Reviewed home blood pressure monitoring Recommended increased home monitoring Discussed diet and exercise Patient self care activities - Over the next 180 days, patient will: Check BP daily, document, and provide at future appointments Ensure daily salt intake < 2300 mg/day Notify providers of home blood pressure readings in 1 to 2 weeks  Hyperlipidemia Lab Results  Component Value Date/Time   LVa [facility]64 11/04/2018 08:06 AM   Pharmacist Clinical Goal(s): Over the next 180 days, patient will work with PharmD and providers to maintain LDL goal <  70 Current regimen:  Atorvastatin 8m Interventions: Reviewed most recent lipid panel Discussed proper medication administration Patient self care activities - Over the next 180 days, patient will: Focus on medication adherence by pill box Work on diet and exercise to keep cholesterol levels controlled  Hypothyroidism Pharmacist Clinical Goal(s) Over the next 180 days, patient will work with PharmD  and providers to optimize medication and minimize symptoms related to hypothyroidism. Current regimen:  Levothyroxine 712m tablets Interventions: Discussed proper medication administration Reviewed most recent TSH Patient self care activities - Over the next 180 days, patient will: Continue to focus on medication adherence by using pill box Take medication in the morning 30 minutes before eating or drinking or taking other medications.   Initial goal documentation         Hypertension    Office blood pressures are  BP Readings from Last 3 Encounters:  06/23/19 (!) 153/75  12/22/18 (!) 131/57  11/13/18 128/64    Patient has failed these meds in the past: atenolol, benazepril  Patient checks BP at home 1-2x per week  Patient home BP readings are ranging: 130-150/70-80  Patient is currently uncontrolled on the following medications:  Metoprolol tartrate 2582mne tablet po bid  Patient reports he checks his blood pressure at home but not as often as he should. Denies chest pain/dizziness.  Recently decreased his metoprolol to one tablet twice daily instead of one and one-half twice daily because of low pulse rate.  Counseled on importance of home monitoring.  He reports he is walking 1 mile outside 3 to 4 times per week.  Plan  Continue current medications, monitor BP daily and record in log.  Patient will enter into MyChart for review by provider.  If BP remains elevated patient may benefit from additional medication.    CAD    Patient has failed these meds in past: none noted Patient is currently controlled on the following medications: ASA 13m14me discussed:  Patient denies chest pain, abnormal bleeding or bruising.  Adherent to ASA 13mg48mly.    Plan  Continue current medications.  Hypercholesterolemia   LDL goal < 70  Lipid Panel     Component Value Date/Time   CHOL 124 11/04/2018 0806   CHOL 124 01/14/2018 0836   TRIG 86 11/04/2018 0806   HDL 43  11/04/2018 0806   HDL 51 01/14/2018 0836   LDLCALC 64 11/04/2018 0806    Hepatic Function Latest Ref Rng & Units 11/04/2018 01/14/2018 09/04/2017  Total Protein 6.1 - 8.1 g/dL 6.3 6.4 6.7  Albumin 3.5 - 4.8 g/dL - 3.9 3.9  AST 10 - 35 U/L 18 22 22   ALT 9 - 46 U/L 18 17 27   Alk Phosphatase 39 - 117 IU/L - 88 106  Total Bilirubin 0.2 - 1.2 mg/dL 0.6 0.3 0.2  Bilirubin, Direct 0.00 - 0.40 mg/dL - 0.11 -     The ASCVD Risk score (GoffMikey Bussingr., et al., 2013) failed to calculate for the following reasons:   The valid total cholesterol range is 130 to 320 mg/dL   Patient has failed these meds in past: none noted Patient is currently controlled on the following medications:  Atorvastatin 80mg 31my  We discussed:  Patient take medication at proper time and is adherent.  LDL controlled at most recent physical.  Patient is due for updated lab work.  Recommended he come in for visit this year to have cholesterol rechecked.  Plan  Continue current medications, recommend lipid  panel. Ulcerative Colitis   Patient has failed these meds in past: none noted Patient is currently controlled on the following medications:  Rowasa 4g enema Sulfasalazine 537m 4 tablets po tid  Patient reports symptoms of UC have been fairly controlled lately.  Did inquire about the cost on both of these medications.  Provided him with GoodRx coupons for both at [employer] counseled him that these copays do not go towards deductible.  Plan  Continue current medications, coupons emailed.  Hypothyroidism   Lab Results  Component Value Date/Time   TSH 2.20 01/27/2019 10:00 AM   TSH 5.56 (H) 11/04/2018 08:06 AM    Patient has failed these meds in past: none noted Patient is currently controlled on the following medications:  Levothryoxine 772m daily  We discussed:  Patient now taking levothyroxine in the morning separate from other medications.  TSH has since normalized.  Reports no symptoms of  hypothyroidism.  Plan  Continue current medications Vaccines   Reviewed and discussed patient's vaccination history.    Immunization History  Administered Date(s) Administered   Influenza Whole 01/29/2007   Influenza, High Dose Seasonal PF 02/15/2016, 03/14/2017, 04/04/2018   Influenza,inj,Quad PF,6+ Mos 03/21/2015   Pneumococcal Conjugate-13 07/07/2013   Pneumococcal Polysaccharide-23 11/29/2007, 03/21/2015   Td 04/30/2005    Plan  Recommended patient receive Shingles vaccine in office.  Medication Management   Miscellaneous medications:  Esomeprazole 4068maily Nitrostat 0.4mg68m OTC's:  Tylenol 325mg43m Miralax MVI Patient currently uses [employer] #  [phone] reports using pill box method to organize medications and promote adherence. Patientdenies missed doses of medication.  ChrisBeverly MilchrmD Clinical Pharmacist [phone] collaborated with the care management provider regarding care management and care coordination activities outlined in this encounter and have reviewed this encounter including documentation in the note and care plan. I am certifying that I agree with the content of this note and encounter as supervising physician.

## 2019-12-01 ENCOUNTER — Ambulatory Visit: Payer: Medicare Other | Admitting: Pharmacist

## 2019-12-01 ENCOUNTER — Other Ambulatory Visit: Payer: Self-pay | Admitting: *Deleted

## 2019-12-01 DIAGNOSIS — E039 Hypothyroidism, unspecified: Secondary | ICD-10-CM

## 2019-12-01 DIAGNOSIS — E7800 Pure hypercholesterolemia, unspecified: Secondary | ICD-10-CM

## 2019-12-01 DIAGNOSIS — I10 Essential (primary) hypertension: Secondary | ICD-10-CM

## 2019-12-01 MED ORDER — NITROGLYCERIN 0.4 MG SL SUBL: 0.4000 mg | SUBLINGUAL_TABLET | SUBLINGUAL | 1 refills | Status: DC | PRN

## 2019-12-01 NOTE — Patient Instructions (Addendum)
Visit Information Thank you for meeting with me today!  I look forward to working with you to help you meet all of your healthcare goals and answer any questions you may have.  Feel free to contact me anytime!  Goals Addressed            This Visit's Progress   . Pharmacy Care Plan:       CARE PLAN ENTRY (see longitudinal plan of care for additional care plan information)  Current Barriers:  . Chronic Disease Management support, education, and care coordination needs related to hypertension, hypercholesterolemia, and hypothyroidism.   Hypertension BP Readings from Last 3 Encounters:  06/23/19 (!) 153/75  12/22/18 (!) 131/57  11/13/18 128/64   . Pharmacist Clinical Goal(s): o Over the next 180 days, patient will work with PharmD and providers to achieve BP goal <130/80 . Current regimen:  o Metoprolol tartrate 20m one tablet po bid . Interventions: o Reviewed home blood pressure monitoring o Recommended increased home monitoring o Discussed diet and exercise . Patient self care activities - Over the next 180 days, patient will: o Check BP daily, document, and provide at future appointments o Ensure daily salt intake < 2300 mg/day o Notify providers of home blood pressure readings in 1 to 2 weeks  Hyperlipidemia Lab Results  Component Value Date/Time   LDLCALC 64 11/04/2018 08:06 AM   . Pharmacist Clinical Goal(s): o Over the next 180 days, patient will work with PharmD and providers to maintain LDL goal < 70 . Current regimen:  o Atorvastatin 843m. Interventions: o Reviewed most recent lipid panel o Discussed proper medication administration . Patient self care activities - Over the next 180 days, patient will: o Focus on medication adherence by pill box o Work on diet and exercise to keep cholesterol levels controlled  Hypothyroidism . Pharmacist Clinical Goal(s) o Over the next 180 days, patient will work with PharmD and providers to optimize medication and  minimize symptoms related to hypothyroidism. . Current regimen:  o Levothyroxine 7563mtablets . Interventions: o Discussed proper medication administration o Reviewed most recent TSH . Patient self care activities - Over the next 180 days, patient will: o Continue to focus on medication adherence by using pill box o Take medication in the morning 30 minutes before eating or drinking or taking other medications.   Initial goal documentation        Stuart Gentry given information about Chronic Care Management services today including:  1. CCM service includes personalized support from designated clinical staff supervised by his physician, including individualized plan of care and coordination with other care providers 2. 24/7 contact phone numbers for assistance for urgent and routine care needs. 3. Standard insurance, coinsurance, copays and deductibles apply for chronic care management only during months in which we provide at least 20 minutes of these services. Most insurances cover these services at 100%, however patients may be responsible for any copay, coinsurance and/or deductible if applicable. This service may help you avoid the need for more expensive face-to-face services. 4. Only one practitioner may furnish and bill the service in a calendar month. 5. The patient may stop CCM services at any time (effective at the end of the month) by phone call to the office staff.  Patient agreed to services and verbal consent obtained.   The patient verbalized understanding of instructions provided today and agreed to receive a mailed copy of patient instruction and/or educational materials. Telephone follow up appointment with pharmacy team  member scheduled for: 6 months  [name], PharmD Clinical Pharmacist [facility] [phone]   Managing Your Hypertension Hypertension is commonly called high blood pressure. This is when the force of your blood  pressing against the walls of your arteries is too strong. Arteries are blood vessels that carry blood from your heart throughout your body. Hypertension forces the heart to work harder to pump blood, and may cause the arteries to become narrow or stiff. Having untreated or uncontrolled hypertension can cause heart attack, stroke, kidney disease, and other problems. What are blood pressure readings? A blood pressure reading consists of a higher number over a lower number. Ideally, your blood pressure should be below 120/80. The first ("top") number is called the systolic pressure. It is a measure of the pressure in your arteries as your heart beats. The second ("bottom") number is called the diastolic pressure. It is a measure of the pressure in your arteries as the heart relaxes. What does my blood pressure reading mean? Blood pressure is classified into four stages. Based on your blood pressure reading, your health care provider may use the following stages to determine what type of treatment you need, if any. Systolic pressure and diastolic pressure are measured in a unit called mm Hg. Normal  Systolic pressure: below 203.  Diastolic pressure: below 80. Elevated  Systolic pressure: 559-741.  Diastolic pressure: below 80. Hypertension stage 1  Systolic pressure: 638-453.  Diastolic pressure: 64-68. Hypertension stage 2  Systolic pressure: 032 or above.  Diastolic pressure: 90 or above. What health risks are associated with hypertension? Managing your hypertension is an important responsibility. Uncontrolled hypertension can lead to:  A heart attack.  A stroke.  A weakened blood vessel (aneurysm).  Heart failure.  Kidney damage.  Eye damage.  Metabolic syndrome.  Memory and concentration problems. What changes can I make to manage my hypertension? Hypertension can be managed by making lifestyle changes and possibly by taking medicines. Your health care provider will help  you make a plan to bring your blood pressure within a normal range. Eating and drinking   Eat a diet that is high in fiber and potassium, and low in salt (sodium), added sugar, and fat. An example eating plan is called the DASH (Dietary Approaches to Stop Hypertension) diet. To eat this way: ? Eat plenty of fresh fruits and vegetables. Try to fill half of your plate at each meal with fruits and vegetables. ? Eat whole grains, such as whole wheat pasta, brown rice, or whole grain bread. Fill about one quarter of your plate with whole grains. ? Eat low-fat diary products. ? Avoid fatty cuts of meat, processed or cured meats, and poultry with skin. Fill about one quarter of your plate with lean proteins such as fish, chicken without skin, beans, eggs, and tofu. ? Avoid premade and processed foods. These tend to be higher in sodium, added sugar, and fat.  Reduce your daily sodium intake. Most people with hypertension should eat less than 1,500 mg of sodium a day.  Limit alcohol intake to no more than 1 drink a day for nonpregnant women and 2 drinks a day for men. One drink equals 12 oz of beer, 5 oz of wine, or 1 oz of hard liquor. Lifestyle  Work with your health care provider to maintain a healthy body weight, or to lose weight. Ask what an ideal weight is for you.  Get at least 30 minutes of exercise that causes your heart  to beat faster (aerobic exercise) most days of the week. Activities may include walking, swimming, or biking.  Include exercise to strengthen your muscles (resistance exercise), such as weight lifting, as part of your weekly exercise routine. Try to do these types of exercises for 30 minutes at least 3 days a week.  Do not use any products that contain nicotine or tobacco, such as cigarettes and e-cigarettes. If you need help quitting, ask your health care provider.  Control any long-term (chronic) conditions you have, such as high cholesterol or  diabetes. Monitoring  Monitor your blood pressure at home as told by your health care provider. Your personal target blood pressure may vary depending on your medical conditions, your age, and other factors.  Have your blood pressure checked regularly, as often as told by your health care provider. Working with your health care provider  Review all the medicines you take with your health care provider because there may be side effects or interactions.  Talk with your health care provider about your diet, exercise habits, and other lifestyle factors that may be contributing to hypertension.  Visit your health care provider regularly. Your health care provider can help you create and adjust your plan for managing hypertension. Will I need medicine to control my blood pressure? Your health care provider may prescribe medicine if lifestyle changes are not enough to get your blood pressure under control, and if:  Your systolic blood pressure is 130 or higher.  Your diastolic blood pressure is 80 or higher. Take medicines only as told by your health care provider. Follow the directions carefully. Blood pressure medicines must be taken as prescribed. The medicine does not work as well when you skip doses. Skipping doses also puts you at risk for problems. Contact a health care provider if:  You think you are having a reaction to medicines you have taken.  You have repeated (recurrent) headaches.  You feel dizzy.  You have swelling in your ankles.  You have trouble with your vision. Get help right away if:  You develop a severe headache or confusion.  You have unusual weakness or numbness, or you feel faint.  You have severe pain in your chest or abdomen.  You vomit repeatedly.  You have trouble breathing. Summary  Hypertension is when the force of blood pumping through your arteries is too strong. If this condition is not controlled, it may put you at risk for serious  complications.  Your personal target blood pressure may vary depending on your medical conditions, your age, and other factors. For most people, a normal blood pressure is less than 120/80.  Hypertension is managed by lifestyle changes, medicines, or both. Lifestyle changes include weight loss, eating a healthy, low-sodium diet, exercising more, and limiting alcohol. This information is not intended to replace advice given to you by your health care provider. Make sure you discuss any questions you have with your health care provider. Document Revised: 08/08/2018 Document Reviewed: 03/14/2016 Elsevier Patient Education  [place].

## 2019-12-04 ENCOUNTER — Other Ambulatory Visit: Payer: Self-pay | Admitting: *Deleted

## 2019-12-04 DIAGNOSIS — E038 Other specified hypothyroidism: Secondary | ICD-10-CM

## 2019-12-04 DIAGNOSIS — E039 Hypothyroidism, unspecified: Secondary | ICD-10-CM

## 2019-12-04 DIAGNOSIS — Z951 Presence of aortocoronary bypass graft: Secondary | ICD-10-CM

## 2019-12-04 DIAGNOSIS — I10 Essential (primary) hypertension: Secondary | ICD-10-CM

## 2019-12-04 DIAGNOSIS — E7800 Pure hypercholesterolemia, unspecified: Secondary | ICD-10-CM

## 2020-01-19 DIAGNOSIS — Z23 Encounter for immunization: Secondary | ICD-10-CM | POA: Diagnosis not present

## 2020-01-21 DIAGNOSIS — Z23 Encounter for immunization: Secondary | ICD-10-CM | POA: Diagnosis not present

## 2020-04-04 IMAGING — DX DG CHEST 1V PORT
1 series · 1 of 1 positions shown · non-contrast
Comparison: 08/11/2017

CLINICAL DATA: Post CABG

EXAM:
PORTABLE CHEST 1 VIEW

[chest]
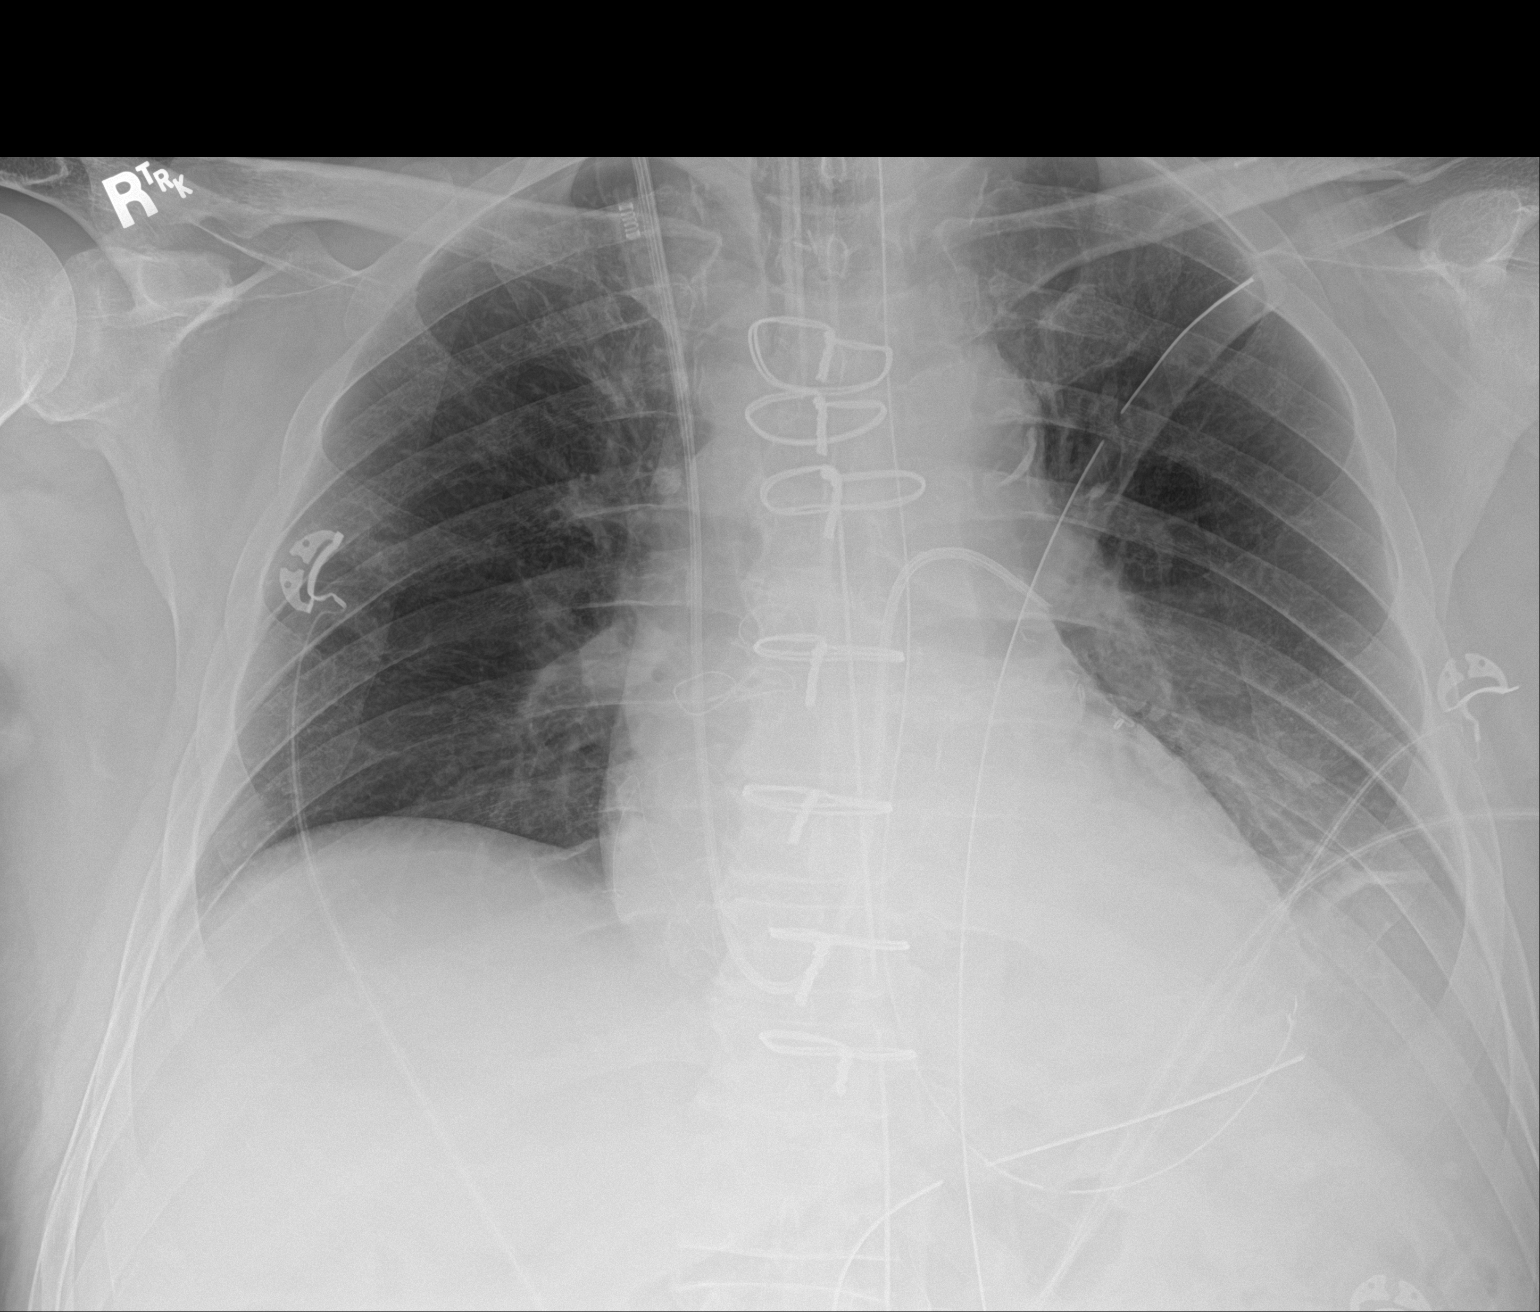

[1 of 1 positions shown; findings below may reference images not displayed]

FINDINGS: Endotracheal tube terminates 4 cm above the carina.

Left chest tube and mediastinal drains. No pneumothorax is seen.
Small left pleural effusion.

Right IJ venous catheter terminates in the left main pulmonary
artery.

Postsurgical changes related to prior CABG.  Median sternotomy.

Enteric tube courses into the proximal stomach.
IMPRESSION: Endotracheal tube terminates 4 cm above the carina.

Left chest tube and mediastinal drains. No pneumothorax is seen.
Small left pleural effusion.

Right IJ venous catheter terminates in the left main pulmonary
artery.

## 2020-04-06 ENCOUNTER — Ambulatory Visit: Payer: Self-pay | Admitting: Pharmacist

## 2020-04-06 IMAGING — DX DG CHEST 1V PORT
1 series · 1 of 1 positions shown · non-contrast
Comparison: 08/13/2017

CLINICAL DATA: Chest pain following coronary bypass graft

EXAM:
PORTABLE CHEST 1 VIEW

[chest ap]
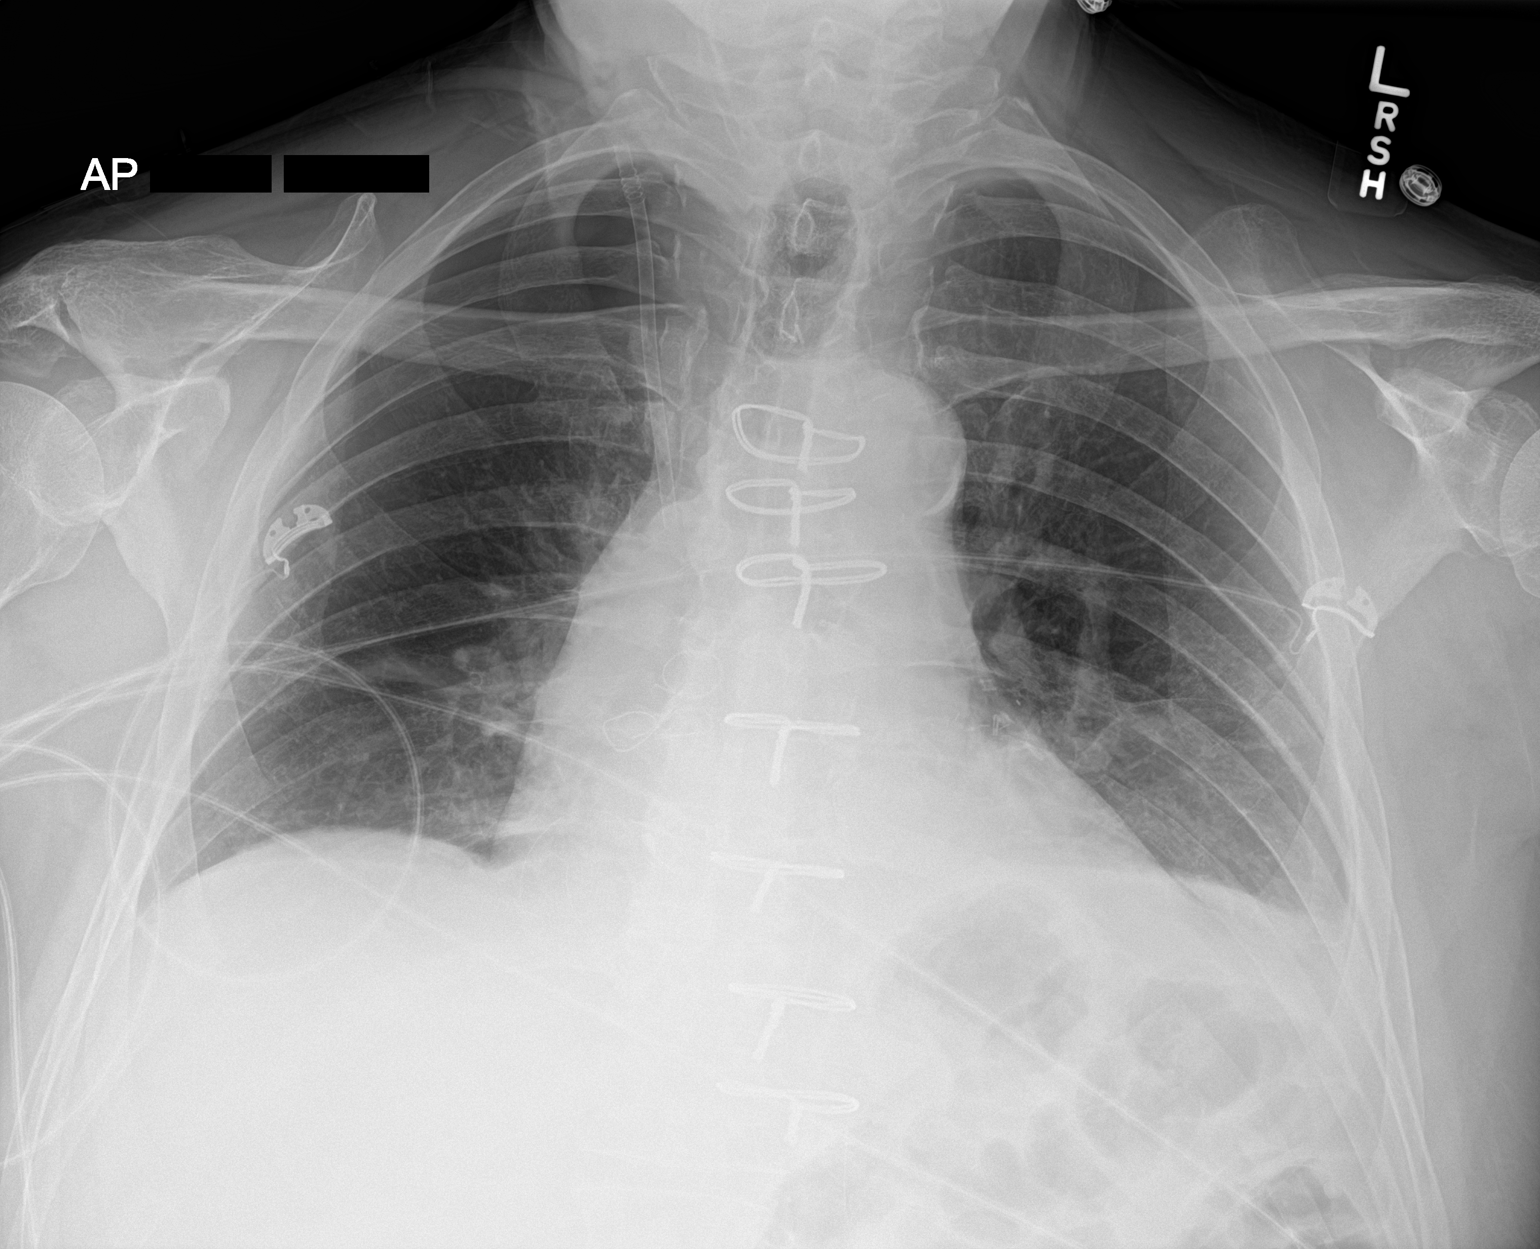

[1 of 1 positions shown; findings below may reference images not displayed]

FINDINGS: Cardiac shadow is stable. Swan-Ganz catheter has been removed in the
interval as has a mediastinal drain and left thoracostomy catheter
as well as the pericardial drain. Right jugular sheath remains.
There is now seen a small right apical pneumothorax which was not as
well appreciated on the prior exam. No focal confluent infiltrate is
seen. No bony abnormality is noted.
IMPRESSION: New small right apical pneumothorax.

Interval removal of tubes and lines as described.

These results will be called to the ordering clinician or
representative by the Radiologist Assistant, and communication
documented in the PACS or zVision Dashboard.

## 2020-04-06 NOTE — Chronic Care Management (AMB) (Addendum)
Chronic Care Management   Follow Up Note   04/13/2020 Name: Stuart Gentry MRN: [id number] DOB: [birth year]  Referred by: Stuart Frizzle, MD Reason for referral : No chief complaint on file.   Stuart Gentry is a 74 y.o. year old male who is a primary care patient of Pickard, Cammie Mcgee, MD. The CCM team was consulted for assistance with chronic disease management and care coordination needs.    Review of patient status, including review of consultants reports, relevant laboratory and other test results, and collaboration with appropriate care team members and the patient's provider was performed as part of comprehensive patient evaluation and provision of chronic care management services.    SDOH (Social Determinants of Health) assessments performed: No See Care Plan activities for detailed interventions related to [facility])      Outpatient Encounter Medications as of 04/06/2020  Medication Sig   levothyroxine (SYNTHROID) 75 MCG tablet Take 1 tablet by mouth once daily   acetaminophen (TYLENOL) 325 MG tablet Take 2 tablets (650 mg total) by mouth every 6 (six) hours as needed for mild pain.   aspirin EC 81 MG tablet Take 1 tablet (81 mg total) by mouth daily.   atorvastatin (LIPITOR) 80 MG tablet TAKE 1 TABLET BY MOUTH ONCE DAILY AT  6PM   esomeprazole (NEXIUM) 20 MG capsule Take 20 mg by mouth every morning.    mesalamine (ROWASA) 4 g enema INSERT 60 ML RECTALLY  AT BEDTIME AS NEEDED   metoprolol tartrate (LOPRESSOR) 25 MG tablet TAKE 1 & 1/2 (ONE & ONE-HALF) TABLETS BY MOUTH TWICE DAILY (Patient taking differently: 25 mg 2 (two) times daily. )   Multiple Vitamin (MULTIVITAMIN) tablet Take 1 tablet by mouth daily.   nitroGLYCERIN (NITROSTAT) 0.4 MG SL tablet Place 1 tablet (0.4 mg total) under the tongue every 5 (five) minutes as needed for chest pain.   polyethylene glycol (MIRALAX / GLYCOLAX) packet Take 17 g by mouth daily.    sulfaSALAzine (AZULFIDINE) 500 MG tablet TAKE 4  TABLETS BY MOUTH 3  TIMES DAILY   No facility-administered encounter medications on file as of 04/06/2020.     Reviewed chart prior to disease state call. Spoke with patient regarding BP No recent OV since last visit with CCM team on 12/01/2019.  Recent Office Vitals: BP Readings from Last 3 Encounters:  06/23/19 (!) 153/75  12/22/18 (!) 131/57  11/13/18 128/64   Pulse Readings from Last 3 Encounters:  06/23/19 (!) 56  12/22/18 (!) 50  11/13/18 (!) 50    Wt Readings from Last 3 Encounters:  06/23/19 205 lb (93 kg)  12/22/18 204 lb (92.5 kg)  11/28/18 204 lb (92.5 kg)     Kidney Function Lab Results  Component Value Date/Time   CREATININE 0.98 11/04/2018 08:06 AM   CREATININE 1.20 09/04/2017 11:45 AM   CREATININE 1.18 08/16/2017 02:11 AM   CREATININE 0.99 06/11/2017 08:50 AM   GFR 64.53 09/11/2016 12:09 PM   GFRNONAA 76 11/04/2018 08:06 AM   GFRAA 88 11/04/2018 08:06 AM    BMP Latest Ref Rng & Units 11/04/2018 09/04/2017 08/16/2017  Glucose 65 - 99 mg/dL 104(H) 104(H) 111(H)  BUN 7 - 25 mg/dL 19 15 28(H)  Creatinine 0.70 - 1.18 mg/dL 0.98 1.20 1.18  BUN/Creat Ratio 6 - 22 (calc) NOT APPLICABLE 13 -  Sodium 135 - 146 mmol/L 138 141 134(L)  Potassium 3.5 - 5.3 mmol/L 4.6 4.7 4.2  Chloride 98 - 110 mmol/L 106 105 102  CO2  20 - 32 mmol/L 25 23 24   Calcium 8.6 - 10.3 mg/dL 9.4 9.8 8.7(L)    Current antihypertensive regimen:  Metoprolol 73m twice daily How often are you checking your Blood Pressure? infrequently Current home BP readings: patient does not remember any blood pressure readings he has taken lately What recent interventions/DTPs have been made by any provider to improve Blood Pressure control since last CPP Visit:  None Any recent hospitalizations or ED visits since last visit with CPP? No What diet changes have been made to improve Blood Pressure Control?  None What exercise is being done to improve your Blood Pressure Control?  Patient continues to walk,  he is now walking about 5 laps around his 1 acre property which is a little over a mile.  He plans to do this daily.  Adherence Review: Is the patient currently on ACE/ARB medication? No Does the patient have >5 day gap between last estimated fill dates? No  He admittedly has not been as adherent to checking his home BP as we discussed in previous visits.  Patient reports he has upcoming lab work on Monday and he will try and record his BP daily until then and bring them to me at that time.  He denies any symptoms of hypertension such as dizziness, headaches.  Will review BP when he brings them in and determine next steps.  Patient also has physical scheduled the first of the year with Dr. PDennard Schaumann   CBeverly Milch PharmD Clinical Pharmacist [place]([phone]   I have collaborated with the care management provider regarding care management and care coordination activities outlined in this encounter and have reviewed this encounter including documentation in the note and care plan. I am certifying that I agree with the content of this note and encounter as supervising physician.

## 2020-04-18 ENCOUNTER — Telehealth: Payer: Self-pay | Admitting: Pharmacist

## 2020-04-18 ENCOUNTER — Other Ambulatory Visit: Payer: Self-pay

## 2020-04-18 ENCOUNTER — Ambulatory Visit: Payer: Self-pay | Admitting: Pharmacist

## 2020-04-18 ENCOUNTER — Other Ambulatory Visit: Payer: Self-pay | Admitting: Family Medicine

## 2020-04-18 ENCOUNTER — Other Ambulatory Visit: Payer: Medicare Other

## 2020-04-18 DIAGNOSIS — Z125 Encounter for screening for malignant neoplasm of prostate: Secondary | ICD-10-CM | POA: Diagnosis not present

## 2020-04-18 DIAGNOSIS — E7800 Pure hypercholesterolemia, unspecified: Secondary | ICD-10-CM

## 2020-04-18 DIAGNOSIS — E039 Hypothyroidism, unspecified: Secondary | ICD-10-CM | POA: Diagnosis not present

## 2020-04-18 MED ORDER — BENAZEPRIL HCL 20 MG PO TABS: 20.0000 mg | ORAL_TABLET | Freq: Every day | ORAL | 3 refills | Status: DC

## 2020-04-18 NOTE — Telephone Encounter (Cosign Needed)
error 

## 2020-04-18 NOTE — Chronic Care Management (AMB) (Signed)
  Chronic Care Management   Follow Up Note   04/18/2020 Name: Stuart Gentry MRN: [id number] DOB: Feb 14, 1946  Referred by: [name], MD Reason for referral : No chief complaint on file.   Stuart Gentry is a 74 y.o. year old male who is a primary care patient of Pickard, Cammie Mcgee, MD. The CCM team was consulted for assistance with chronic disease management and care coordination needs.    Review of patient status, including review of consultants reports, relevant laboratory and other test results, and collaboration with appropriate care team members and the patient's provider was performed as part of comprehensive patient evaluation and provision of chronic care management services.    SDOH (Social Determinants of Health) assessments performed: No See Care Plan activities for detailed interventions related to [facility])     Outpatient Encounter Medications as of 04/18/2020  Medication Sig  . levothyroxine (SYNTHROID) 75 MCG tablet Take 1 tablet by mouth once daily  . acetaminophen (TYLENOL) 325 MG tablet Take 2 tablets (650 mg total) by mouth every 6 (six) hours as needed for mild pain.  Marland Kitchen aspirin EC 81 MG tablet Take 1 tablet (81 mg total) by mouth daily.  Marland Kitchen atorvastatin (LIPITOR) 80 MG tablet TAKE 1 TABLET BY MOUTH ONCE DAILY AT  6PM  . esomeprazole (NEXIUM) 20 MG capsule Take 20 mg by mouth every morning.   . mesalamine (ROWASA) 4 g enema INSERT 60 ML RECTALLY  AT BEDTIME AS NEEDED  . metoprolol tartrate (LOPRESSOR) 25 MG tablet TAKE 1 & 1/2 (ONE & ONE-HALF) TABLETS BY MOUTH TWICE DAILY (Patient taking differently: 25 mg 2 (two) times daily. )  . Multiple Vitamin (MULTIVITAMIN) tablet Take 1 tablet by mouth daily.  . nitroGLYCERIN (NITROSTAT) 0.4 MG SL tablet Place 1 tablet (0.4 mg total) under the tongue every 5 (five) minutes as needed for chest pain.  . polyethylene glycol (MIRALAX / GLYCOLAX) packet Take 17 g by mouth daily.   Marland Kitchen sulfaSALAzine (AZULFIDINE) 500 MG tablet  TAKE 4 TABLETS BY MOUTH 3  TIMES DAILY   No facility-administered encounter medications on file as of 04/18/2020.     Objective:  As a follow up to call last week, patient was to bring blood pressure readings by th office for my review since he has appointment with the lab today for updated lab work for upcoming CPE on 05/02/20.  Goals Addressed   None     There are no care plans to display for this patient.  Patient brought in blood pressure readings which were as follows:  12/15 9:38 pm - 155/58 54P 12/16 10:00 am - 152/75 54 P 12/16 11:00 am - 142/76 51P 12/16 10:40 pm - 163/78 51P 12/17 9:15 am - 150/87 63P 12/17 10:45 pm - 170/78 51P 12/18 12:20 pm - 172/81 46P 12/19 9:00 am - 177/90 49P 12/19 6:30 pm - 152/72 52P  The majority of his blood pressure readings are considerably above goal.  Will consult with cardiology about possibility of additional medication.  Patient has been on both benazepril and amlodipine in the past.  Would recommend benazepril 44m daily initially and titrate up as needed, will consult cardiology and PCP.  Plan:   The patient has been provided with contact information for the care management team and has been advised to call with any health related questions or concerns.    CBeverly Milch PharmD Clinical Pharmacist [place]([phone]

## 2020-04-18 NOTE — Telephone Encounter (Signed)
-----   Message from [name], MD sent at 04/18/2020  1:06 PM EST ----- I would recommend him starting benazepril 20 mg a day and I will send in rx.  Like to see him back in 2 weeks.   ----- Message ----- From: Edythe Clarity, [facility] Sent: 04/18/2020   9:02 AM EST To: [name], MD  Dr. [name],  Mr. Ator brought me his recent hom BP today when he came in for lab :  12/15 9:38 pm - 155/58 54P  12/16 10:00 am - 152/75 54 P  12/16 11:00 am - 142/76 51P  12/16 10:40 pm - 163/78 51P  12/17 9:15 am - 150/87 63P  12/17 10:45 pm - 170/78 51P  12/18 12:20 pm - 172/81 46P  12/19 9:00 am - 177/90 49P  12/19 6:30 pm - 152/72 52P   Most readings are above goal. I know he has been on both amlodipine and benazepril in the past and was wondering if you saw it fit to re-start one of these due to recent high BP readings. Possibly benazepril 65m daily and titrate up as needed based on his BP.    Just wanted to pass along the information.  He has a physical scheduled with you 05/02/20.  CBeverly Milch PharmD  Clinical Pharmacist  [place] ([phone]

## 2020-04-18 NOTE — Telephone Encounter (Cosign Needed)
Contacted patient to let him know we called in the medication.  He will monitor BP at home until physical with Dr. [name] on 05/02/20.  [name], PharmD Clinical Pharmacist [place] [phone]

## 2020-04-19 LAB — COMPLETE METABOLIC PANEL WITH GFR
AG Ratio: 1.5 (calc) (ref 1.0–2.5)
ALT: 15 U/L (ref 9–46)
AST: 20 U/L (ref 10–35)
Albumin: 3.8 g/dL (ref 3.6–5.1)
Alkaline phosphatase (APISO): 67 U/L (ref 35–144)
BUN: 16 mg/dL (ref 7–25)
CO2: 26 mmol/L (ref 20–32)
Calcium: 9.4 mg/dL (ref 8.6–10.3)
Chloride: 107 mmol/L (ref 98–110)
Creat: 1.12 mg/dL (ref 0.70–1.18)
GFR, Est African American: 74 mL/min/{1.73_m2} (ref >=60)
GFR, Est Non African American: 64 mL/min/{1.73_m2} (ref >=60)
Globulin: 2.5 g/dL (calc) (ref 1.9–3.7)
Glucose, Bld: 109 mg/dL — ABNORMAL HIGH (ref 65–99)
Potassium: 5.1 mmol/L (ref 3.5–5.3)
Sodium: 141 mmol/L (ref 135–146)
Total Bilirubin: 0.5 mg/dL (ref 0.2–1.2)
Total Protein: 6.3 g/dL (ref 6.1–8.1)

## 2020-04-19 LAB — LIPID PANEL
Cholesterol: 128 mg/dL (ref <200)
HDL: 50 mg/dL (ref >=40)
LDL Cholesterol (Calc): 62 mg/dL (calc)
Non-HDL Cholesterol (Calc): 78 mg/dL (calc) (ref <130)
Total CHOL/HDL Ratio: 2.6 (calc) (ref <5.0)
Triglycerides: 79 mg/dL (ref <150)

## 2020-04-19 LAB — CBC WITH DIFFERENTIAL/PLATELET
Absolute Monocytes: 625 cells/uL (ref 200–950)
Basophils Absolute: 53 cells/uL (ref 0–200)
Basophils Relative: 0.9 %
Eosinophils Absolute: 112 cells/uL (ref 15–500)
Eosinophils Relative: 1.9 %
HCT: 35.8 % — ABNORMAL LOW (ref 38.5–50.0)
Hemoglobin: 12 g/dL — ABNORMAL LOW (ref 13.2–17.1)
Lymphs Abs: 1162 cells/uL (ref 850–3900)
MCH: 32.8 pg (ref 27.0–33.0)
MCHC: 33.5 g/dL (ref 32.0–36.0)
MCV: 97.8 fL (ref 80.0–100.0)
MPV: 11.2 fL (ref 7.5–12.5)
Monocytes Relative: 10.6 %
Neutro Abs: 3947 cells/uL (ref 1500–7800)
Neutrophils Relative %: 66.9 %
Platelets: 196 10*3/uL (ref 140–400)
RBC: 3.66 10*6/uL — ABNORMAL LOW (ref 4.20–5.80)
RDW: 11.8 % (ref 11.0–15.0)
Total Lymphocyte: 19.7 %
WBC: 5.9 10*3/uL (ref 3.8–10.8)

## 2020-04-19 LAB — TSH: TSH: 4.33 mIU/L (ref 0.40–4.50)

## 2020-05-02 ENCOUNTER — Encounter: Payer: Self-pay | Admitting: Family Medicine

## 2020-05-02 ENCOUNTER — Other Ambulatory Visit: Payer: Self-pay

## 2020-05-02 ENCOUNTER — Ambulatory Visit (INDEPENDENT_AMBULATORY_CARE_PROVIDER_SITE_OTHER): Payer: Medicare Other | Admitting: Family Medicine

## 2020-05-02 VITALS — BP 122/64 | HR 62 | Temp 98.7°F | Resp 16 | Ht 71.0 in | Wt 212.0 lb

## 2020-05-02 DIAGNOSIS — I25110 Atherosclerotic heart disease of native coronary artery with unstable angina pectoris: Secondary | ICD-10-CM | POA: Diagnosis not present

## 2020-05-02 DIAGNOSIS — Z951 Presence of aortocoronary bypass graft: Secondary | ICD-10-CM | POA: Diagnosis not present

## 2020-05-02 DIAGNOSIS — Z0001 Encounter for general adult medical examination with abnormal findings: Secondary | ICD-10-CM | POA: Diagnosis not present

## 2020-05-02 DIAGNOSIS — E038 Other specified hypothyroidism: Secondary | ICD-10-CM | POA: Diagnosis not present

## 2020-05-02 DIAGNOSIS — Z125 Encounter for screening for malignant neoplasm of prostate: Secondary | ICD-10-CM

## 2020-05-02 DIAGNOSIS — I10 Essential (primary) hypertension: Secondary | ICD-10-CM | POA: Diagnosis not present

## 2020-05-02 DIAGNOSIS — Z0000 Encounter for general adult medical examination without abnormal findings: Secondary | ICD-10-CM

## 2020-05-02 DIAGNOSIS — E7800 Pure hypercholesterolemia, unspecified: Secondary | ICD-10-CM | POA: Diagnosis not present

## 2020-05-02 MED ORDER — SILDENAFIL CITRATE 100 MG PO TABS: 50.0000 mg | ORAL_TABLET | Freq: Every day | ORAL | 11 refills | Status: DC | PRN

## 2020-05-02 NOTE — Progress Notes (Signed)
Subjective:    Patient ID: Stuart Gentry, male    DOB: 04-May-1945, 75 y.o.   MRN: [id number]  HPI Patient is here for CPE. PMH is significant for severe 3 vessel disease s/p CABG with Dr Cyndia Bent on 08/12/17.  Post op course complicated by PAF converted with amiodarone and no anticoagulation needed   Last colonoscopy was 11/2018 showing chronic colitis consistant with ulcerative colitis.  He denies any falls, depression, or memory loss.  He has had his flu shot.  He has had his Covid vaccines x3.  He does report some occasional erectile dysfunction.  Since adding benazepril 20 mg a day, his blood pressure has been 130-150/60-70.  He denies any fatigue.  Heart rate is in the 40s to 60s but usually in the 50s.  His most recent labs are listed below: Lab on 04/18/2020  Component Date Value Ref Range Status  . Cholesterol 04/18/2020 128  <200 mg/dL Final  . HDL 04/18/2020 50  > OR = 40 mg/dL Final  . Triglycerides 04/18/2020 79  <150 mg/dL Final  . LDL Cholesterol (Calc) 04/18/2020 62  mg/dL (calc) Final   Comment: Reference range: <100 . Desirable range <100 mg/dL for primary prevention;   <70 mg/dL for patients with CHD or diabetic patients  with > or = 2 CHD risk factors. Marland Kitchen LDL-C is now calculated using the Martin-Hopkins  calculation, which is a validated novel method providing  better accuracy than the Friedewald equation in the  estimation of LDL-C.  Cresenciano Genre et al. Annamaria Helling. 2595;638(75): 2061-2068  (http://education.[employer].com/faq/FAQ164)   . Total CHOL/HDL Ratio 04/18/2020 2.6  <5.0 (calc) Final  . Non-HDL Cholesterol (Calc) 04/18/2020 78  <130 mg/dL (calc) Final   Comment: For patients with diabetes plus 1 major ASCVD risk  factor, treating to a non-HDL-C goal of <100 mg/dL  (LDL-C of <70 mg/dL) is considered a therapeutic  option.   . WBC 04/18/2020 5.9  3.8 - 10.8 Thousand/uL Final  . RBC 04/18/2020 3.66* 4.20 - 5.80 Million/uL Final  . Hemoglobin 04/18/2020  12.0* 13.2 - 17.1 g/dL Final  . HCT 04/18/2020 35.8* 38.5 - 50.0 % Final  . MCV 04/18/2020 97.8  80.0 - 100.0 fL Final  . MCH 04/18/2020 32.8  27.0 - 33.0 pg Final  . MCHC 04/18/2020 33.5  32.0 - 36.0 g/dL Final  . RDW 04/18/2020 11.8  11.0 - 15.0 % Final  . Platelets 04/18/2020 196  140 - 400 Thousand/uL Final  . MPV 04/18/2020 11.2  7.5 - 12.5 fL Final  . Neutro Abs 04/18/2020 3,947  1,500 - 7,800 cells/uL Final  . Lymphs Abs 04/18/2020 1,162  850 - 3,900 cells/uL Final  . Absolute Monocytes 04/18/2020 625  200 - 950 cells/uL Final  . Eosinophils Absolute 04/18/2020 112  15 - 500 cells/uL Final  . Basophils Absolute 04/18/2020 53  0 - 200 cells/uL Final  . Neutrophils Relative % 04/18/2020 66.9  % Final  . Total Lymphocyte 04/18/2020 19.7  % Final  . Monocytes Relative 04/18/2020 10.6  % Final  . Eosinophils Relative 04/18/2020 1.9  % Final  . Basophils Relative 04/18/2020 0.9  % Final  . Glucose, Bld 04/18/2020 109* 65 - 99 mg/dL Final   Comment: .            Fasting reference interval . For someone without known diabetes, a glucose value between 100 and 125 mg/dL is consistent with prediabetes and should be confirmed with a follow-up test. .   Marland Kitchen  BUN 04/18/2020 16  7 - 25 mg/dL Final  . Creat 04/18/2020 1.12  0.70 - 1.18 mg/dL Final   Comment: For patients >4 years of age, the reference limit for Creatinine is approximately 13% higher for people identified as African-American. .   . GFR, Est Non African American 04/18/2020 64  > OR = 60 mL/min/1.67m Final  . GFR, Est African American 04/18/2020 74  > OR = 60 mL/min/1.751mFinal  . BUN/Creatinine Ratio 1278/58/8502OT APPLICABLE  6 - 22 (calc) Final  . Sodium 04/18/2020 141  135 - 146 mmol/L Final  . Potassium 04/18/2020 5.1  3.5 - 5.3 mmol/L Final  . Chloride 04/18/2020 107  98 - 110 mmol/L Final  . CO2 04/18/2020 26  20 - 32 mmol/L Final  . Calcium 04/18/2020 9.4  8.6 - 10.3 mg/dL Final  . Total Protein 04/18/2020 6.3   6.1 - 8.1 g/dL Final  . Albumin 04/18/2020 3.8  3.6 - 5.1 g/dL Final  . Globulin 04/18/2020 2.5  1.9 - 3.7 g/dL (calc) Final  . AG Ratio 04/18/2020 1.5  1.0 - 2.5 (calc) Final  . Total Bilirubin 04/18/2020 0.5  0.2 - 1.2 mg/dL Final  . Alkaline phosphatase (APISO) 04/18/2020 67  35 - 144 U/L Final  . AST 04/18/2020 20  10 - 35 U/L Final  . ALT 04/18/2020 15  9 - 46 U/L Final  . TSH 04/18/2020 4.33  0.40 - 4.50 mIU/L Final   His immunizations are listed below: Immunization History  Administered Date(s) Administered  . Influenza Whole 01/29/2007  . Influenza, High Dose Seasonal PF 02/15/2016, 03/14/2017, 04/04/2018  . Influenza,inj,Quad PF,6+ Mos 03/21/2015  . Pneumococcal Conjugate-13 07/07/2013  . Pneumococcal Polysaccharide-23 11/29/2007, 03/21/2015  . Td 04/30/2005    Past Medical History:  Diagnosis Date  . Chronic kidney disease    kidney stones?  . Coronary artery calcification seen on CAT scan   . GERD (gastroesophageal reflux disease)   . Hypercholesteremia   . Hypertension   . Hypothyroidism    no meds  . [place] infarction [place]  . Sleep apnea    never been tested  . Ulcerative colitis    Past Surgical History:  Procedure Laterality Date  . COLONOSCOPY    . CORONARY ARTERY BYPASS GRAFT N/A 08/12/2017   Procedure: CORONARY ARTERY BYPASS GRAFTING (CABG) x six , using left internal mammory artery and right leg greater saphenous vein harvested endoscopically.;  Surgeon: BaGaye PollackMD;  Location: [facility];  Service: Open Heart Surgery;  Laterality: N/A;  . LEFT HEART CATH AND CORONARY ANGIOGRAPHY N/A 08/09/2017   Procedure: LEFT HEART CATH AND CORONARY ANGIOGRAPHY;  Surgeon: McBurnell BlanksMD;  [facility];  Service: Cardiovascular;  Laterality: N/A;  . SKIN TAG REMOVAL Right 2018   underarm  . TEE WITHOUT CARDIOVERSION N/A 08/12/2017   Procedure: TRANSESOPHAGEAL ECHOCARDIOGRAM (TEE);  Surgeon: BaGaye PollackMD;  [facility]  Service: Open Heart Surgery;  Laterality: N/A;   Current Outpatient Medications on File Prior to Visit  Medication Sig Dispense Refill  . levothyroxine (SYNTHROID) 75 MCG tablet Take 1 tablet by mouth once daily 30 tablet 0  . acetaminophen (TYLENOL) 325 MG tablet Take 2 tablets (650 mg total) by mouth every 6 (six) hours as needed for mild pain.    . Marland Kitchenspirin EC 81 MG tablet Take 1 tablet (81 mg total) by mouth daily.    . Marland Kitchentorvastatin (LIPITOR) 80 MG tablet TAKE 1  TABLET BY MOUTH ONCE DAILY AT  6PM 90 tablet 3  . benazepril (LOTENSIN) 20 MG tablet Take 1 tablet (20 mg total) by mouth daily. 90 tablet 3  . esomeprazole (NEXIUM) 20 MG capsule Take 20 mg by mouth every morning.     . mesalamine (ROWASA) 4 g enema INSERT 60 ML RECTALLY  AT BEDTIME AS NEEDED 1800 mL 0  . metoprolol tartrate (LOPRESSOR) 25 MG tablet TAKE 1 & 1/2 (ONE & ONE-HALF) TABLETS BY MOUTH TWICE DAILY (Patient taking differently: 25 mg 2 (two) times daily. ) 270 tablet 2  . Multiple Vitamin (MULTIVITAMIN) tablet Take 1 tablet by mouth daily.    . nitroGLYCERIN (NITROSTAT) 0.4 MG SL tablet Place 1 tablet (0.4 mg total) under the tongue every 5 (five) minutes as needed for chest pain. 25 tablet 1  . polyethylene glycol (MIRALAX / GLYCOLAX) packet Take 17 g by mouth daily.     Marland Kitchen sulfaSALAzine (AZULFIDINE) 500 MG tablet TAKE 4 TABLETS BY MOUTH 3  TIMES DAILY 480 tablet 8   No current facility-administered medications on file prior to visit.   No Known Allergies Social History   Socioeconomic History  . Marital status: Married    Spouse name: Not on file  . Number of children: 3  . Years of education: Not on file  . Highest education level: Not on file  Occupational History  . Occupation: Retired  Tobacco Use  . Smoking status: Never Smoker  . Smokeless tobacco: Never Used  Vaping Use  . Vaping Use: Never used  Substance and Sexual Activity  . Alcohol use: Yes    Comment: 1-2 cans beer monthly  . Drug use: No  .  Sexual activity: Not on file  Other Topics Concern  . Not on file  Social History Narrative  . Not on file   Social Determinants of Health   Financial Resource Strain: Low Risk   . Difficulty of Paying Living Expenses: Not very hard  Food Insecurity: Not on file  Transportation Needs: Not on file  Physical Activity: Not on file  Stress: Not on file  Social Connections: Not on file  Intimate Partner Violence: Not on file     Review of Systems  All other systems reviewed and are negative.      Objective:   Physical Exam Vitals reviewed.  Constitutional:      General: He is not in acute distress.    Appearance: He is well-developed. He is not diaphoretic.  HENT:     Head: Normocephalic and atraumatic.     Right Ear: External ear normal.     Left Ear: External ear normal.     Nose: Nose normal.     Mouth/Throat:     Pharynx: No oropharyngeal exudate.  Eyes:     General: No scleral icterus.       Right eye: No discharge.        Left eye: No discharge.     Conjunctiva/sclera: Conjunctivae normal.     Pupils: Pupils are equal, round, and reactive to light.  Neck:     Thyroid: No thyromegaly.     Vascular: No JVD.     Trachea: No tracheal deviation.  Cardiovascular:     Rate and Rhythm: Regular rhythm. Bradycardia present.     Heart sounds: Normal heart sounds. No murmur heard. No friction rub. No gallop.   Pulmonary:     Effort: Pulmonary effort is normal. No respiratory distress.     Breath  sounds: Normal breath sounds. No stridor. No wheezing or rales.  Chest:     Chest wall: No tenderness.  Abdominal:     General: Bowel sounds are normal. There is no distension.     Palpations: Abdomen is soft. There is no mass.     Tenderness: There is no abdominal tenderness. There is no guarding or rebound.  Musculoskeletal:        General: No tenderness or deformity.     Cervical back: Normal range of motion and neck supple.  Lymphadenopathy:     Cervical: No cervical  adenopathy.  Skin:    General: Skin is warm.     Coloration: Skin is not pale.     Findings: No erythema or rash.  Neurological:     Mental Status: He is alert and oriented to person, place, and time.     Cranial Nerves: No cranial nerve deficit.     Motor: No abnormal muscle tone.     Coordination: Coordination normal.     Deep Tendon Reflexes: Reflexes are normal and symmetric.  Psychiatric:        Behavior: Behavior normal.        Thought Content: Thought content normal.        Judgment: Judgment normal.           Assessment & Plan:  General medical exam  Subclinical hypothyroidism  S/P CABG x 6  Coronary artery disease involving native coronary artery of native heart with unstable angina pectoris ([place])  Pure hypercholesterolemia  Essential hypertension  Prostate cancer screening - Plan: PSA  Patient continues to be bradycardic however he is asymptomatic and therefore I will make no further reductions in his metoprolol unless he develops fatigue or lightheadedness.  I did ask him to increase his benazepril to 40 mg a day due to his persistently elevated blood pressure and notify me of the values in 2 weeks.  I will give him Viagra 50 to 100 mg p.o. daily as needed erectile dysfunction however I cautioned the patient not to use that medication with nitroglycerin due to the risk of life-threatening hypotension.  Cancer screening is up-to-date I will check a PSA today to complete prostate cancer screening.  Immunizations are up-to-date.  Lab work otherwise is outstanding except for mildly elevated blood sugar.  I did recommend decreasing his consumption of carbohydrates.

## 2020-05-03 ENCOUNTER — Encounter: Payer: Self-pay | Admitting: *Deleted

## 2020-05-03 LAB — PSA: PSA: 2.29 ng/mL (ref <=4.0)

## 2020-05-18 ENCOUNTER — Other Ambulatory Visit: Payer: Self-pay | Admitting: Gastroenterology

## 2020-06-06 ENCOUNTER — Telehealth: Payer: Self-pay | Admitting: Gastroenterology

## 2020-06-06 MED ORDER — SULFASALAZINE 500 MG PO TABS: ORAL_TABLET | ORAL | 1 refills | Status: DC

## 2020-06-06 NOTE — Telephone Encounter (Signed)
Patient aware that he is overdue for follow up.  Patient scheduled to see Dr [name] on 06-10-2020 at 8:50am. [place] [place]  Patient agreed to plan and verbalized understanding. No further questions.

## 2020-06-06 NOTE — Telephone Encounter (Signed)
Pt is requesting for his AZULFIDINE to be sent to [facility] in [place].

## 2020-06-07 NOTE — Progress Notes (Signed)
Virtual Visit via Video Note   This visit type was conducted due to national recommendations for restrictions regarding the COVID-19 Pandemic (e.g. social distancing) in an effort to limit this patient's exposure and mitigate transmission in our community.  Due to her co-morbid illnesses, this patient is at least at moderate risk for complications without adequate follow up.  This format is felt to be most appropriate for this patient at this time.  All issues noted in this document were discussed and addressed.  A limited physical exam was performed with this format.  Please refer to the patient's chart for her consent to telehealth for [facility].   Patient Location: Home Physician Location Office  Date:  06/16/2020   ID:  Stuart Gentry, Stuart Gentry, Stuart Gentry, MRN [id number]  PCP:  [name], MD  Cardiologist:   Jenkins Rouge, MD   History of Present Illness:  75 y.o. seen initially 07/2017 for chest pain. CRF;s include HTN, HLD.  Retired from [employer]  Wife indicates he watches tv tood much and sedentary   TTE done 08/10/17 EF 55-60% Trivial AR normal estimated PA pressure Stress testing positive and had cath with CM on 08/09/17 Films reviewed  CTO mid LAD with severe 3 VD Referred for CABG with Dr Cyndia Bent on 08/12/17 LIMA to LAD SVG sequential D1/D2 Sequential graft to OM1/OM3 and SVG PDA Post op course complicated by PAFconverted with amiodaroe and no anticoagulation needed  D/C with amiodarone 200 bid stopped last visit with PA   Not going to [facility] due to [place] with his grandson  No angina Compliant with meds  Both he and wife had vaccines    Tends to be bradycardic. Benazepril added by primary for BP He is on Lopressor 25 mg bid  LDL 62    Past Medical History:  Diagnosis Date  . Chronic kidney disease    kidney stones?  . Coronary artery calcification seen on CAT scan   . GERD (gastroesophageal reflux disease)   . Hypercholesteremia   . Hypertension   .  Hypothyroidism    no meds  . Myocardial infarction ([place])   . Sleep apnea    never been tested  . Ulcerative colitis     Past Surgical History:  Procedure Laterality Date  . COLONOSCOPY    . CORONARY ARTERY BYPASS GRAFT N/A 08/12/2017   Procedure: CORONARY ARTERY BYPASS GRAFTING (CABG) x six , using left internal mammory artery and right leg greater saphenous vein harvested endoscopically.;  Surgeon: Gaye Pollack, MD;  Location: [facility];  Service: Open Heart Surgery;  Laterality: N/A;  . LEFT HEART CATH AND CORONARY ANGIOGRAPHY N/A 08/09/2017   Procedure: LEFT HEART CATH AND CORONARY ANGIOGRAPHY;  Surgeon: Burnell Blanks, MD;  Location: [facility];  Service: Cardiovascular;  Laterality: N/A;  . SKIN TAG REMOVAL Right 2018   underarm  . TEE WITHOUT CARDIOVERSION N/A 08/12/2017   Procedure: TRANSESOPHAGEAL ECHOCARDIOGRAM (TEE);  Surgeon: Gaye Pollack, MD;  Location: [facility];  Service: Open Heart Surgery;  Laterality: N/A;     Current Outpatient Medications  Medication Sig Dispense Refill  . acetaminophen (TYLENOL) 325 MG tablet Take 2 tablets (650 mg total) by mouth every 6 (six) hours as needed for mild pain.    Marland Kitchen aspirin EC 81 MG tablet Take 1 tablet (81 mg total) by mouth daily.    Marland Kitchen atorvastatin (LIPITOR) 80 MG tablet TAKE 1 TABLET BY MOUTH ONCE DAILY AT  6PM 90 tablet 3  .  benazepril (LOTENSIN) 40 MG tablet Take 1 tablet (40 mg total) by mouth daily. 90 tablet 1  . esomeprazole (NEXIUM) 20 MG capsule Take 20 mg by mouth every morning.     Marland Kitchen levothyroxine (SYNTHROID) 75 MCG tablet Take 1 tablet by mouth once daily 30 tablet 0  . mesalamine (ROWASA) 4 g enema INSERT 60 ML RECTALLY  AT BEDTIME AS NEEDED 1800 mL 0  . metoprolol tartrate (LOPRESSOR) 25 MG tablet TAKE 1 & 1/2 (ONE & ONE-HALF) TABLETS BY MOUTH TWICE DAILY (Patient taking differently: 25 mg 2 (two) times daily.) 270 tablet 2  . Multiple Vitamin (MULTIVITAMIN) tablet Take 1 tablet by mouth daily.    .  nitroGLYCERIN (NITROSTAT) 0.4 MG SL tablet Place 1 tablet (0.4 mg total) under the tongue every 5 (five) minutes as needed for chest pain. (Patient not taking: Reported on 06/10/2020) 25 tablet 1  . sildenafil (VIAGRA) 100 MG tablet Take 0.5-1 tablets (50-100 mg total) by mouth daily as needed for erectile dysfunction. 5 tablet 11  . sulfaSALAzine (AZULFIDINE) 500 MG tablet TAKE 4 TABLETS BY MOUTH 3  TIMES DAILY 360 tablet 1   No current facility-administered medications for this visit.    Allergies:   Patient has no known allergies.    Social History:  The patient  reports that he has never smoked. He has never used smokeless tobacco. He reports current alcohol use. He reports that he does not use drugs.   Family History:  The patient's family history includes Esophageal cancer in his brother; Heart disease in his brother; Kidney disease in his brother; Liver cancer in his mother; Lymphoma in his brother.    ROS:  Please see the history of present illness.   Otherwise, review of systems are positive for none.   All other systems are reviewed and negative.    PHYSICAL EXAM: VS:  There were no vitals taken for this visit. , BMI There is no height or weight on file to calculate BMI.  No distress JVP normal  Skin warm and dry No edema No tachycpnea    EKG:   SR rate 57 non specific QRS widening LAFB    Recent Labs: 04/18/2020: ALT 15; BUN 16; Creat 1.12; Hemoglobin 12.0; Platelets 196; Potassium 5.1; Sodium 141; TSH 4.33    Lipid Panel    Component Value Date/Time   CHOL 128 04/18/2020 0839   CHOL 124 01/14/2018 0836   TRIG 79 04/18/2020 0839   HDL 50 04/18/2020 0839   HDL 51 01/14/2018 0836   CHOLHDL 2.6 04/18/2020 0839   VLDL 20 11/07/2016 0816   LDLCALC 62 04/18/2020 0839      Wt Readings from Last 3 Encounters:  Gentry/11/22 95.3 kg  05/02/20 96.2 kg  Gentry/23/21 93 kg      Other studies Reviewed: Additional studies/ records that were reviewed today include: Notes  primary 06/18/17 labs, Abdominal CT ECG.    ASSESSMENT AND PLAN:  1. CAD/CABG 07/2017 improved  No angina continue ASA, statin and beta blocker  2. Dyspnea:  EF normal by TTE continue weight loss and risk factor modification    4. HLD:  Continue statin LDL at goal 64 on 11/04/18 labs with primary  5. PAF:  Maintaining NSR off amiodarone improved  6. Thyroid:  On replacement TSH normal f/u with Dr [name]  7. Ulcerative Colitis :  Long standing Colonoscopy Dr [name] 11/2018 benign f/u GI 8. Bradycardia:  Stable     Current medicines are reviewed at length  with the patient today.  The patient does not have concerns regarding medicines.  The following changes have been made:  None   Labs/ tests ordered today include: None    No orders of the defined types were placed in this encounter.  Time: spent reviewing chart CABG report direct patient interview and writing note 20 minutes    Disposition:   FU with cardiology  In a year      Signed, Jenkins Rouge, MD  06/16/2020 8:42 AM    [facility] [address], [place], [place]  [address] Phone: [phone]; Fax: [phone]

## 2020-06-08 ENCOUNTER — Telehealth: Payer: Self-pay | Admitting: *Deleted

## 2020-06-08 ENCOUNTER — Ambulatory Visit (INDEPENDENT_AMBULATORY_CARE_PROVIDER_SITE_OTHER): Payer: Medicare Other | Admitting: Pharmacist

## 2020-06-08 DIAGNOSIS — E7800 Pure hypercholesterolemia, unspecified: Secondary | ICD-10-CM | POA: Diagnosis not present

## 2020-06-08 DIAGNOSIS — I10 Essential (primary) hypertension: Secondary | ICD-10-CM | POA: Diagnosis not present

## 2020-06-08 DIAGNOSIS — E039 Hypothyroidism, unspecified: Secondary | ICD-10-CM

## 2020-06-08 MED ORDER — BENAZEPRIL HCL 40 MG PO TABS: 40.0000 mg | ORAL_TABLET | Freq: Every day | ORAL | 1 refills | Status: DC

## 2020-06-08 NOTE — Patient Instructions (Addendum)
Visit Information  Goals Addressed            This Visit's Progress   . Track and Manage My Blood Pressure-Hypertension       Timeframe:  Long-Range Goal Priority:  High Start Date:       06/08/20                      Expected End Date:   12/06/20                    Follow Up Date 07/28/20   - check blood pressure daily, check 1 to 2 hours after taking medication   Why is this important?    You won't feel high blood pressure, but it can still hurt your blood vessels.   High blood pressure can cause heart or kidney problems. It can also cause a stroke.   Making lifestyle changes like losing a little weight or eating less salt will help.   Checking your blood pressure at home and at different times of the day can help to control blood pressure.   If the doctor prescribes medicine remember to take it the way the doctor ordered.   Call the office if you cannot afford the medicine or if there are questions about it.     Notes:       Patient Care Plan: General Pharmacy (Adult)    Problem Identified: HTN, HLD, Hypothyroidism   Priority: High  Onset Date: 06/08/2020    Long-Range Goal: Patient-Specific Goal   Start Date: 06/08/2020  Expected End Date: 12/06/2020  This Visit's Progress: On track  Priority: High  Note:   Current Barriers:  . Unable to achieve control of blood pressure    Pharmacist Clinical Goal(s):  Marland Kitchen Over the next 180 days, patient will achieve control of blood pressure as evidenced by home BP readings through collaboration with PharmD and provider.  . Maintain control on cholesterol LDL < 70.  Interventions: . 1:1 collaboration with [name], MD regarding development and update of comprehensive plan of care as evidenced by provider attestation and co-signature . Inter-disciplinary care team collaboration (see longitudinal plan of care) . Comprehensive medication review performed; medication list updated in electronic medical  record  Hypertension (BP goal <140/90) -controlled -Current treatment: . Benazepril 84m daily . Metoprolol 25 mg one tablet twice daily -Medications previously tried: none noted  -Current home readings: 112/68, 132/67 were the two he provided.   -Current exercise habits: patient reports he is trying to walk a mile to mile and a half about 3-4 times per week especially as weather warms -Denies hypotensive/hypertensive symptoms -Educated on BP goals and benefits of medications for prevention of heart attack, stroke and kidney damage; Daily salt intake goal < 2300 mg; Exercise goal of 150 minutes per week; Importance of home blood pressure monitoring; -Counseled to monitor BP at home daily, document, and provide log at future appointments -Recommended to continue current medication Recommended he check his blood pressure about an hour after taking his medications at night to see how it is affecting his BP  - Requested new Rx for 451mbenazepril so he just has to take one tablet - send to [facility]  Hyperlipidemia: (LDL goal < 70) -controlled -Current treatment: . Atorvastatin 8043maily -Medications previously tried: none noted  -Current exercise habits: see above -Educated on Cholesterol goals;  Exercise goal of 150 minutes per week; -Recommended to continue current medication - Most recent  lipid panel is WNL  Hypothyroidism (Goal: Maintain TSH) -controlled -Current treatment  . Levothyroxine 72mg daily -Medications previously tried: none noted  -Recommended to continue current medication Counseled on medication administration   Patient Goals/Self-Care Activities . Over the next 180 days, patient will:  - take medications as prescribed check blood pressure an hour after taking BP med, document, and provide at future appointments Contact providers if BP consistently > 140/90  Follow Up Plan: The care management team will reach out to the patient again over the next 90  days.      The patient verbalized understanding of instructions, educational materials, and care plan provided today and agreed to receive a mailed copy of patient instructions, educational materials, and care plan.   Telephone follow up appointment with pharmacy team member scheduled for: 6 months  [facility] How to Take Your Blood Pressure Blood pressure measures how strongly your blood is pressing against the walls of your arteries. Arteries are blood vessels that carry blood from your heart throughout your body. You can take your blood pressure at home with a machine. You may need to check your blood pressure at home:  To check if you have high blood pressure (hypertension).  To check your blood pressure over time.  To make sure your blood pressure medicine is working. Supplies needed:  Blood pressure machine, or monitor.  Dining room chair to sit in.  Table or desk.  Small notebook.  Pencil or pen. How to prepare Avoid these things for 30 minutes before checking your blood pressure:  Having drinks with caffeine in them, such as coffee or tea.  Drinking alcohol.  Eating.  Smoking.  Exercising. Do these things five minutes before checking your blood pressure:  Go to the bathroom and pee (urinate).  Sit in a dining chair. Do not sit in a soft couch or an armchair.  Be quiet. Do not talk. How to take your blood pressure Follow the instructions that came with your machine. If you have a digital blood pressure monitor, these may be the instructions: 1. Sit up straight. 2. Place your feet on the floor. Do not cross your ankles or legs. 3. Rest your left arm at the level of your heart. You may rest it on a table, desk, or chair. 4. Pull up your shirt sleeve. 5. Wrap the blood pressure cuff around the upper part of your left arm. The cuff should be 1 inch (2.5 cm) above your elbow. It is best to wrap the cuff around bare skin. 6. Fit the cuff snugly  around your arm. You should be able to place only one finger between the cuff and your arm. 7. Place the cord so that it rests in the bend of your elbow. 8. Press the power button. 9. Sit quietly while the cuff fills with air and loses air. 10. Write down the numbers on the screen. 11. Wait 2-3 minutes and then repeat steps 1-10.   What do the numbers mean? Two numbers make up your blood pressure. The first number is called systolic pressure. The second is called diastolic pressure. An example of a blood pressure reading is "120 over 80" (or 120/80). If you are an adult and do not have a medical condition, use this guide to find out if your blood pressure is normal: Normal  First number: below 120.  Second number: below 80. Elevated  First number: 120-129.  Second number: below 80. Hypertension stage 1  First  number: 130-139.  Second number: 80-89. Hypertension stage 2  First number: 140 or above.  Second number: 44 or above. Your blood pressure is above normal even if only the top or bottom number is above normal. Follow these instructions at home:  Check your blood pressure as often as your doctor tells you to.  Check your blood pressure at the same time every day.  Take your monitor to your next doctor's appointment. Your doctor will: ? Make sure you are using it correctly. ? Make sure it is working right.  Make sure you understand what your blood pressure numbers should be.  Tell your doctor if your medicine is causing side effects.  Keep all follow-up visits as told by your doctor. This is important. General tips:  You will need a blood pressure machine, or monitor. Your doctor can suggest a monitor. You can buy one at a drugstore or online. When choosing one: ? Choose one with an arm cuff. ? Choose one that wraps around your upper arm. Only one finger should fit between your arm and the cuff. ? Do not choose one that measures your blood pressure from your wrist  or finger. Where to find more information American Heart Association: www.heart.org Contact a doctor if:  Your blood pressure keeps being high. Get help right away if:  Your first blood pressure number is higher than 180.  Your second blood pressure number is higher than 120. Summary  Check your blood pressure at the same time every day.  Avoid caffeine, alcohol, smoking, and exercise for 30 minutes before checking your blood pressure.  Make sure you understand what your blood pressure numbers should be. This information is not intended to replace advice given to you by your health care provider. Make sure you discuss any questions you have with your health care provider. Document Revised: 04/10/2019 Document Reviewed: 04/10/2019 Elsevier Patient Education  2021 [employer].

## 2020-06-08 NOTE — Telephone Encounter (Signed)
Prescription sent to pharmacy.

## 2020-06-08 NOTE — Progress Notes (Signed)
Chronic Care Management Pharmacy Note  06/08/2020 Name:  Stuart Gentry MRN:  [id number] DOB:  Mar 21, 1946  Subjective: Stuart Gentry is an 75 y.o. year old male who is a primary patient of Pickard, Cammie Mcgee, MD.  The CCM team was consulted for assistance with disease management and care coordination needs.    Engaged with patient by telephone for follow up visit in response to provider referral for pharmacy case management and/or care coordination services.   Consent to Services:  The patient was given the following information about Chronic Care Management services today, agreed to services, and gave verbal consent: 1. CCM service includes personalized support from designated clinical staff supervised by the primary care provider, including individualized plan of care and coordination with other care providers 2. 24/7 contact phone numbers for assistance for urgent and routine care needs. 3. Service will only be billed when office clinical staff spend 20 minutes or more in a month to coordinate care. 4. Only one practitioner may furnish and bill the service in a calendar month. 5.The patient may stop CCM services at any time (effective at the end of the month) by phone call to the office staff. 6. The patient will be responsible for cost sharing (co-pay) of up to 20% of the service fee (after annual deductible is met). Patient agreed to services and consent obtained.  Patient Care Team: [name], MD as PCP - General (Family Medicine) Josue Hector, MD as PCP - Cardiology (Cardiology) [name], MD (Family Medicine) Edythe Clarity, [facility] as Pharmacist (Pharmacist)  Recent office visits: 05/02/20 [name]) - general physical, reports BP 130-150/60-70 since adding benazepril 87m. HR isusually in the 50s.  Benazepril was increased to 430mdaily and given Viagra 50 to 10089maily.  Recent consult visits: None recent  Hospital visits: None in previous 6  months  Objective:  Lab Results  Component Value Date   CREATININE 1.12 04/18/2020   BUN 16 04/18/2020   GFR 64.53 09/11/2016   GFRNONAA 64 04/18/2020   GFRAA 74 04/18/2020   NA 141 04/18/2020   K 5.1 04/18/2020   CALCIUM 9.4 04/18/2020   CO2 26 04/18/2020    Lab Results  Component Value Date/Time   HGBA1C 4.3 (L) 08/12/2017 05:49 AM   GFR 64.53 09/11/2016 12:09 PM    Last diabetic Eye exam: No results found for: HMDIABEYEEXA  Last diabetic Foot exam: No results found for: HMDIABFOOTEX   Lab Results  Component Value Date   CHOL 128 04/18/2020   HDL 50 04/18/2020   LDLCALC 62 04/18/2020   TRIG 79 04/18/2020   CHOLHDL 2.6 04/18/2020    Hepatic Function Latest Ref Rng & Units 04/18/2020 11/04/2018 01/14/2018  Total Protein 6.1 - 8.1 g/dL 6.3 6.3 6.4  Albumin 3.5 - 4.8 g/dL - - 3.9  AST 10 - 35 U/L _0 ALT 9 - 46 U/L _1 Alk Phosphatase 39 - 117 IU/L - - 88  Total Bilirubin 0.2 - 1.2 mg/dL 0.5 0.6 0.3  Bilirubin, Direct 0.00 - 0.40 mg/dL - - 0.11    Lab Results  Component Value Date/Time   TSH 4.33 04/18/2020 08:39 AM   TSH 2.20 01/27/2019 10:00 AM    CBC Latest Ref Rng & Units 04/18/2020 11/04/2018 09/04/2017  WBC 3.8 - 10.8 Thousand/uL 5.9 5.0 5.4  Hemoglobin 13.2 - 17.1 g/dL 12.0(L) 12.2(L) 12.0(L)  Hematocrit 38.5 - 50.0 % 35.8(L) 36.4(L) 35.5(L)  Platelets 140 -  400 Thousand/uL 196 206 297    No results found for: VD25OH  Clinical ASCVD: Yes  The ASCVD Risk score [name], et al., 2013) failed to calculate for the following reasons:   The valid total cholesterol range is 130 to 320 mg/dL    Depression screen [facility] 2/9 05/02/2020 11/13/2018 11/01/2017  Decreased Interest 0 0 0  Down, Depressed, Hopeless 0 0 0  PHQ - 2 Score 0 0 0  Altered sleeping 0 - 1  Tired, decreased energy 0 - 1  Change in appetite 0 - 0  Feeling bad or failure about yourself  0 - 0  Trouble concentrating 0 - 0  Moving slowly or fidgety/restless 0 - 0  Suicidal thoughts  0 - 0  PHQ-9 Score 0 - 2  Difficult doing work/chores Not difficult at all - Not difficult at all      Social History   Tobacco Use  Smoking Status Never Smoker  Smokeless Tobacco Never Used   BP Readings from Last 3 Encounters:  05/02/20 122/64  06/23/19 (!) 153/75  12/22/18 (!) 131/57   Pulse Readings from Last 3 Encounters:  05/02/20 62  06/23/19 (!) 56  12/22/18 (!) 50   Wt Readings from Last 3 Encounters:  05/02/20 212 lb (96.2 kg)  06/23/19 205 lb (93 kg)  12/22/18 204 lb (92.5 kg)    Assessment/Interventions: Review of patient past medical history, allergies, medications, health status, including review of consultants reports, laboratory and other test data, was performed as part of comprehensive evaluation and provision of chronic care management services.   SDOH:  (Social Determinants of Health) assessments and interventions performed: No   CCM Care Plan  No Known Allergies  Medications Reviewed Today    Reviewed by Edythe Clarity, [facility] (Pharmacist) on 06/08/20 at 1250  Med List Status: <None>  Medication Order Taking? Sig Documenting Provider Last Dose Status Informant  acetaminophen (TYLENOL) 325 MG tablet [id number] Yes Take 2 tablets (650 mg total) by mouth every 6 (six) hours as needed for mild pain. Barrett, Lodema Hong, PA-C Taking Active   aspirin EC 81 MG tablet [id number] Yes Take 1 tablet (81 mg total) by mouth daily. Josue Hector, MD Taking Active Self  atorvastatin (LIPITOR) 80 MG tablet [id number] Yes TAKE 1 TABLET BY MOUTH ONCE DAILY AT  Josephina Gip, MD Taking Active   benazepril (LOTENSIN) 20 MG tablet [id number] Yes Take 1 tablet (20 mg total) by mouth daily. [name], MD Taking Active   esomeprazole (NEXIUM) 20 MG capsule [id number] Yes Take 20 mg by mouth every morning.  [provider] Taking Active Self  levothyroxine (SYNTHROID) 75 MCG tablet [id number] Yes Take 1 tablet by mouth once daily [name], MD Taking  Active   mesalamine (ROWASA) 4 g enema [id number] Yes INSERT 60 ML RECTALLY  AT BEDTIME AS NEEDED Milus Banister, MD Taking Active   metoprolol tartrate (LOPRESSOR) 25 MG tablet [id number] Yes TAKE 1 & 1/2 (ONE & ONE-HALF) TABLETS BY MOUTH TWICE DAILY  Patient taking differently: 25 mg 2 (two) times daily.   Josue Hector, MD Taking Active   Multiple Vitamin (MULTIVITAMIN) tablet [id number] Yes Take 1 tablet by mouth daily. [provider] Taking Active Self  nitroGLYCERIN (NITROSTAT) 0.4 MG SL tablet [id number] Yes Place 1 tablet (0.4 mg total) under the tongue every 5 (five) minutes as needed for chest pain. Josue Hector, MD Taking Active   sildenafil (VIAGRA) 100 MG  tablet [id number] Yes Take 0.5-1 tablets (50-100 mg total) by mouth daily as needed for erectile dysfunction. [name], MD Taking Active   sulfaSALAzine (AZULFIDINE) 500 MG tablet [id number] Yes TAKE 4 TABLETS BY MOUTH 3  TIMES DAILY Milus Banister, MD Taking Active           Patient Active Problem List   Diagnosis Date Noted  . S/P CABG x 6 08/12/2017  . CAD (coronary artery disease), native coronary artery 08/09/2017  . CAD (coronary artery disease) 08/09/2017  . Coronary artery disease involving native coronary artery of native heart with unstable angina pectoris ([place])   . Coronary artery calcification seen on CAT scan   . Hypertension   . Ulcerative colitis ([place])   . Hypercholesteremia   . Hypothyroidism     Immunization History  Administered Date(s) Administered  . Fluad Quad(high Dose 65+) 01/26/2020  . Influenza Whole 01/29/2007  . Influenza, High Dose Seasonal PF 02/15/2016, 03/14/2017, 04/04/2018, 01/28/2019  . Influenza,inj,Quad PF,6+ Mos 03/21/2015  . PFIZER(Purple Top)SARS-COV-2 Vaccination 05/20/2019, 06/10/2019, 01/21/2020  . Pneumococcal Conjugate-13 07/07/2013  . Pneumococcal Polysaccharide-23 11/29/2007, 03/21/2015  . Td 04/30/2005    Conditions to be addressed/monitored:   HTN, HLD, Hypothyroidism  Care Plan : General Pharmacy (Adult)  Updates made by Edythe Clarity, RPH since 06/08/2020 12:00 AM    Problem: HTN, HLD, Hypothyroidism   Priority: High  Onset Date: 06/08/2020    Long-Range Goal: Patient-Specific Goal   Start Date: 06/08/2020  Expected End Date: 12/06/2020  This Visit's Progress: On track  Priority: High  Note:   Current Barriers:  . Unable to achieve control of blood pressure    Pharmacist Clinical Goal(s):  Marland Kitchen Over the next 180 days, patient will achieve control of blood pressure as evidenced by home BP readings through collaboration with PharmD and provider.  . Maintain control on cholesterol LDL < 70.  Interventions: . 1:1 collaboration with [name], MD regarding development and update of comprehensive plan of care as evidenced by provider attestation and co-signature . Inter-disciplinary care team collaboration (see longitudinal plan of care) . Comprehensive medication review performed; medication list updated in electronic medical record  Hypertension (BP goal <140/90) -controlled -Current treatment: . Benazepril 68m daily . Metoprolol 25 mg one tablet twice daily -Medications previously tried: none noted  -Current home readings: 112/68, 132/67 were the two he provided.   -Current exercise habits: patient reports he is trying to walk a mile to mile and a half about 3-4 times per week especially as weather warms -Denies hypotensive/hypertensive symptoms -Educated on BP goals and benefits of medications for prevention of heart attack, stroke and kidney damage; Daily salt intake goal < 2300 mg; Exercise goal of 150 minutes per week; Importance of home blood pressure monitoring; -Counseled to monitor BP at home daily, document, and provide log at future appointments -Recommended to continue current medication Recommended he check his blood pressure about an hour after taking his medications at night to see how it is  affecting his BP  - Requested new Rx for 451mbenazepril so he just has to take one tablet - send to [facility]  Hyperlipidemia: (LDL goal < 70) -controlled -Current treatment: . Atorvastatin 8024maily -Medications previously tried: none noted  -Current exercise habits: see above -Educated on Cholesterol goals;  Exercise goal of 150 minutes per week; -Recommended to continue current medication - Most recent lipid panel is WNL  Hypothyroidism (Goal: Maintain TSH) -controlled -Current treatment  . Levothyroxine 69m15m  daily -Medications previously tried: none noted  -Recommended to continue current medication Counseled on medication administration   Patient Goals/Self-Care Activities . Over the next 180 days, patient will:  - take medications as prescribed check blood pressure an hour after taking BP med, document, and provide at future appointments Contact providers if BP consistently > 140/90  Follow Up Plan: The care management team will reach out to the patient again over the next 90 days.       Medication Assistance: None required.  Patient affirms current coverage meets needs.  Patient's preferred pharmacy is:  [facility] [address] [place] - [place] [address] Phone: [phone] Fax: [phone]  [place], [address], [address] [place], [address] [place] [address] Phone: [phone] Fax: [phone]  Uses pill box? Yes Pt endorses 100% compliance  We discussed: Benefits of medication synchronization, packaging and delivery as well as enhanced pharmacist oversight with [employer]. Patient decided to: Continue current medication management strategy  Follow Up:  Patient agrees to Care Plan and Follow-up.  Plan: The care management team will reach out to the patient again over the next 90 days.  [name], PharmD Clinical Pharmacist [place] [phone]

## 2020-06-08 NOTE — Telephone Encounter (Signed)
-----   Message from Edythe Clarity, [facility] sent at 06/08/2020  3:00 PM EST ----- Hey, patient asked me if we could call in rx for his benazepril to be 52m qd instead of him taking 2 of the 29mtabs.  Dr. PiDennard Schaumannecently increased his dose.  [facility].

## 2020-06-10 ENCOUNTER — Other Ambulatory Visit: Payer: Self-pay

## 2020-06-10 ENCOUNTER — Encounter: Payer: Self-pay | Admitting: Gastroenterology

## 2020-06-10 ENCOUNTER — Ambulatory Visit (INDEPENDENT_AMBULATORY_CARE_PROVIDER_SITE_OTHER): Payer: Medicare Other | Admitting: Gastroenterology

## 2020-06-10 VITALS — BP 120/60 | HR 68 | Ht 70.0 in | Wt 210.0 lb

## 2020-06-10 DIAGNOSIS — K51919 Ulcerative colitis, unspecified with unspecified complications: Secondary | ICD-10-CM | POA: Diagnosis not present

## 2020-06-10 DIAGNOSIS — I25110 Atherosclerotic heart disease of native coronary artery with unstable angina pectoris: Secondary | ICD-10-CM | POA: Diagnosis not present

## 2020-06-10 NOTE — Progress Notes (Signed)
Review of pertinent gastrointestinal problems:  1. Diagnosed with UC in mid 1980s in [place] bloody diarrhea; Moved to [facility], GI MD Dr. Ladean Raya cared for 1990s; was on prednisone early on in problem. Seems to flare 1-2 per year with urgency, mucous output maybe a bit of blood. Will take rowasa enemas for a month. Chronically on sulfasalazine. He has been getting q two year colonoscopies.Has believes that he has only had a very limited left sided disease. Multiple colonoscopies done over the past 20 years. I reviewed many of them from Dr. Ladean Raya; it is clear that he has had only macroscopic disease in the rectosigmoid colon up to about 25 cm however biopsies of his descending, right colon have also shown changes of chronic inflammatory bowel disease several times. His last colonoscopy outside was January 2013.Colonoscopy 05/2013 [name] terminal ileum was normal. There was clear, moderate inflammation from anus to about 60cm proximal (very indistinct transition to normal mucosa). The right colon was essentially normal appearing. Biopsies showed chronic and active inflammation in left colon; right colon was normal. 06/2012 changed to mesalamine full strength, nightly rowasa. 2015, continued intermittent flares, offered several options for medical management, decided to increase the sulfasalazine to 6 g daily, also mesalamine enemas. Colonoscopy Dr. [name] 10/2016:Normal TI, mild inflammation to 30cm (path quiencent colitis), focal hepatic flexure inflammation (chronic moderately active colitis). Random colon biopsies without dysplasia.  Colonoscopy August 2020 Dr. [name] the terminal ileum was normal, the distal colon was mildly inflamed overtly with discrete transition from abnormal to normal mucosa around 40 cm from the anus.  Extensive biopsies were taken.  Right colon biopsies showed "inactive colitis and colitis" and the left colon showed active mild inflammation.  He was recommended to have follow-up in 1 to 2 months  after adjusting his medicines.  We have not heard from him since. 2. Upper endoscopy 2013, small 'ulcers in stomach.' Ulcers had healed on nexium daily. Small submucosal lesion in EGD,eventually evaluated at [facility]. DR. Janeice Robinson by EUS, was told it was fatty lesion (lipoma) and it needed no further following. 3. Ischemic colitis (likely) 08/2016:acute abd pain, prominantly inflammed sigmoid segment on CT 08/2016.   HPI: This is a very pleasant 75 year old man who is here with his wife today   I last saw him August 2020 the time of colonoscopy.  He had some active inflammation in the left colon, colitis and disease in the right colon.  Some medication adjustments were made and he was to follow-up with me in the office.  We have not really heard from him since then.  Lab work December 2021 CBC was normal except for hemoglobin 12.0 which is actually on the high side for him.  Complete metabolic profile was normal, TSH was normal.  After his colonoscopy about 2 years ago he was taking his sulfasalazine 4 pills 3 times daily very religiously.  He only needed Rowasa enemas every several weeks.  On that regimen he would have a solid bowel movement every 2 or 3 days only.  Starting about 2 or 3 weeks ago he began to have some urgency and slight increased frequency.  No bleeding.  He restarted Rowasa enemas on a daily basis and within just a few days his symptoms returned to completely normal for him.  He is having no abdominal pains.  His weight is overall stable.  ROS: complete GI ROS as described in HPI, all other review negative.  Constitutional:  No unintentional weight loss   Past Medical History:  Diagnosis Date  . Chronic kidney disease    kidney stones?  . Coronary artery calcification seen on CAT scan   . GERD (gastroesophageal reflux disease)   . Hypercholesteremia   . Hypertension   . Hypothyroidism    no meds  . Myocardial infarction ([place])   . Sleep apnea    never been tested  .  Ulcerative colitis     Past Surgical History:  Procedure Laterality Date  . COLONOSCOPY    . CORONARY ARTERY BYPASS GRAFT N/A 08/12/2017   Procedure: CORONARY ARTERY BYPASS GRAFTING (CABG) x six , using left internal mammory artery and right leg greater saphenous vein harvested endoscopically.;  Surgeon: Gaye Pollack, MD;  Location: [facility] OR;  Service: Open Heart Surgery;  Laterality: N/A;  . LEFT HEART CATH AND CORONARY ANGIOGRAPHY N/A 08/09/2017   Procedure: LEFT HEART CATH AND CORONARY ANGIOGRAPHY;  Surgeon: Burnell Blanks, MD;  Location: [facility] LAB;  Service: Cardiovascular;  Laterality: N/A;  . SKIN TAG REMOVAL Right 2018   underarm  . TEE WITHOUT CARDIOVERSION N/A 08/12/2017   Procedure: TRANSESOPHAGEAL ECHOCARDIOGRAM (TEE);  Surgeon: Gaye Pollack, MD;  Location: [facility];  Service: Open Heart Surgery;  Laterality: N/A;    Current Outpatient Medications  Medication Sig Dispense Refill  . acetaminophen (TYLENOL) 325 MG tablet Take 2 tablets (650 mg total) by mouth every 6 (six) hours as needed for mild pain.    Marland Kitchen aspirin EC 81 MG tablet Take 1 tablet (81 mg total) by mouth daily.    Marland Kitchen atorvastatin (LIPITOR) 80 MG tablet TAKE 1 TABLET BY MOUTH ONCE DAILY AT  6PM 90 tablet 3  . benazepril (LOTENSIN) 40 MG tablet Take 1 tablet (40 mg total) by mouth daily. 90 tablet 1  . esomeprazole (NEXIUM) 20 MG capsule Take 20 mg by mouth every morning.     Marland Kitchen levothyroxine (SYNTHROID) 75 MCG tablet Take 1 tablet by mouth once daily 30 tablet 0  . mesalamine (ROWASA) 4 g enema INSERT 60 ML RECTALLY  AT BEDTIME AS NEEDED 1800 mL 0  . metoprolol tartrate (LOPRESSOR) 25 MG tablet TAKE 1 & 1/2 (ONE & ONE-HALF) TABLETS BY MOUTH TWICE DAILY (Patient taking differently: 25 mg 2 (two) times daily.) 270 tablet 2  . Multiple Vitamin (MULTIVITAMIN) tablet Take 1 tablet by mouth daily.    . sildenafil (VIAGRA) 100 MG tablet Take 0.5-1 tablets (50-100 mg total) by mouth daily as needed for erectile  dysfunction. 5 tablet 11  . sulfaSALAzine (AZULFIDINE) 500 MG tablet TAKE 4 TABLETS BY MOUTH 3  TIMES DAILY 360 tablet 1  . nitroGLYCERIN (NITROSTAT) 0.4 MG SL tablet Place 1 tablet (0.4 mg total) under the tongue every 5 (five) minutes as needed for chest pain. (Patient not taking: Reported on 06/10/2020) 25 tablet 1   No current facility-administered medications for this visit.    Allergies as of 06/10/2020  . (No Known Allergies)    Family History  Problem Relation Age of Onset  . Liver cancer Mother   . Heart disease Brother   . Lymphoma Brother   . Kidney disease Brother   . Esophageal cancer Brother   . Colon cancer Neg Hx   . Colon polyps Neg Hx   . Rectal cancer Neg Hx   . Stomach cancer Neg Hx     Social History   Socioeconomic History  . Marital status: Married    Spouse name: Not on file  . Number of children: 3  .  Years of education: Not on file  . Highest education level: Not on file  Occupational History  . Occupation: Retired  Tobacco Use  . Smoking status: Never Smoker  . Smokeless tobacco: Never Used  Vaping Use  . Vaping Use: Never used  Substance and Sexual Activity  . Alcohol use: Yes    Comment: 1-2 cans beer monthly  . Drug use: No  . Sexual activity: Not on file  Other Topics Concern  . Not on file  Social History Narrative  . Not on file   Social Determinants of Health   Financial Resource Strain: Low Risk   . Difficulty of Paying Living Expenses: Not very hard  Food Insecurity: Not on file  Transportation Needs: Not on file  Physical Activity: Not on file  Stress: Not on file  Social Connections: Not on file  Intimate Partner Violence: Not on file    Physical Exam: Ht 5' 10"  (1.778 m) Comment: height measured without shoes  Wt 210 lb (95.3 kg)   BMI 30.13 kg/m  Constitutional: generally well-appearing Psychiatric: alert and oriented x3 Abdomen: soft, nontender, nondistended, no obvious ascites, no peritoneal signs, normal  bowel sounds No peripheral edema noted in lower extremities  Assessment and plan: 75 y.o. male with ulcerative colitis  It seems like his minor flare has responded very well to resuming Rowasa enemas on a daily basis.  I recommended that he continue that for the next 3 or 4 weeks and then taper down to every other day for a week or 2 before going back to a as needed basis.  I am happy to refill his GI medicines when they are needed, he does not think he needs any refills at this point.  We discussed colon cancer screening with colonoscopy and he would like to wait until all of later this year and I think that is certainly very reasonable.  We will put him in our reminder system for that.  Please see the "Patient Instructions" section for addition details about the plan.  [name], MD [facility] 06/10/2020, 8:49 AM   Total time on date of encounter was 35 minutes (this included time spent preparing to see the patient reviewing records; obtaining and/or reviewing separately obtained history; performing a medically appropriate exam and/or evaluation; counseling and educating the patient and family if present; ordering medications, tests or procedures if applicable; and documenting clinical information in the health record).

## 2020-06-10 NOTE — Patient Instructions (Signed)
If you are age 76 or older, your body mass index should be between 23-30. Your Body mass index is 30.13 kg/m. If this is out of the aforementioned range listed, please consider follow up with your Primary Care Provider.  If you are age 27 or younger, your body mass index should be between 19-25. Your Body mass index is 30.13 kg/m. If this is out of the aformentioned range listed, please consider follow up with your Primary Care Provider.   You will be due for a recall colonoscopy in September 2022. We will send you a reminder in the mail when it gets closer to that time.  Please continue taking your current dose of Sulfasalazine.  Please do a daily Rowasa enema for the next 3-4 weeks, then one every other day for 1-2 weeks.  It was great seeing you today!  Thank you for entrusting me with your care and choosing [facility].  Dr. [name]

## 2020-06-16 ENCOUNTER — Other Ambulatory Visit: Payer: Self-pay

## 2020-06-16 ENCOUNTER — Telehealth (INDEPENDENT_AMBULATORY_CARE_PROVIDER_SITE_OTHER): Payer: Medicare Other | Admitting: Cardiovascular Disease

## 2020-06-16 VITALS — BP 132/72 | HR 60 | Ht 70.0 in | Wt 202.0 lb

## 2020-06-16 DIAGNOSIS — E782 Mixed hyperlipidemia: Secondary | ICD-10-CM

## 2020-06-16 DIAGNOSIS — Z951 Presence of aortocoronary bypass graft: Secondary | ICD-10-CM | POA: Diagnosis not present

## 2020-06-16 DIAGNOSIS — I10 Essential (primary) hypertension: Secondary | ICD-10-CM

## 2020-06-16 DIAGNOSIS — I25110 Atherosclerotic heart disease of native coronary artery with unstable angina pectoris: Secondary | ICD-10-CM | POA: Diagnosis not present

## 2020-06-16 NOTE — Patient Instructions (Signed)
Medication Instructions:  *If you need a refill on your cardiac medications before your next appointment, please call your pharmacy*  Lab Work: If you have labs (blood work) drawn today and your tests are completely normal, you will receive your results only by: Marland Kitchen MyChart Message (if you have MyChart) OR . A paper copy in the mail If you have any lab test that is abnormal or we need to change your treatment, we will call you to review the results.  Testing/Procedures: None ordered today.  Follow-Up: At [facility], you and your health needs are our priority.  As part of our continuing mission to provide you with exceptional heart care, we have created designated Provider Care Teams.  These Care Teams include your primary Cardiologist (physician) and Advanced Practice Providers (APPs -  Physician Assistants and Nurse Practitioners) who all work together to provide you with the care you need, when you need it.  We recommend signing up for the patient portal called "MyChart".  Sign up information is provided on this After Visit Summary.  MyChart is used to connect with patients for Virtual Visits (Telemedicine).  Patients are able to view lab/test results, encounter notes, upcoming appointments, etc.  Non-urgent messages can be sent to your provider as well.   To learn more about what you can do with MyChart, go to [email or web address].    Your next appointment:   1 year(s)  The format for your next appointment:   In Person  Provider:   You may see Jenkins Rouge, MD or one of the following Advanced Practice Providers on your designated Care Team:    Kathyrn Drown, NP

## 2020-06-27 ENCOUNTER — Other Ambulatory Visit: Payer: Self-pay | Admitting: Family Medicine

## 2020-07-19 ENCOUNTER — Other Ambulatory Visit: Payer: Self-pay | Admitting: Cardiovascular Disease

## 2020-07-20 ENCOUNTER — Telehealth: Payer: Self-pay | Admitting: Pharmacist

## 2020-07-20 NOTE — Progress Notes (Addendum)
Chronic Care Management Pharmacy Assistant   Name: Stuart Gentry  MRN: [id number] DOB: March 21, 1946  Reason for Encounter: Disease State For HTN.   Conditions to be addressed/monitored: hypertension, CAD, ulcerative colitis, hypothyroidism, hypercholesterolemia.  Recent office visits:  None since 06/08/20  Recent consult visits:  06/16/20 Cardiology Josue Hector, MD. For Coronary artery bypass surgery follow up. No medication changes.  06/10/20 Zeb Comfort, MD. For Ulcerataive Colitis. Per note: Please do a daily Rowasa enema for the next 3-4 weeks, then one every other day for 1-2 weeks.  Hospital visits:  None in previous 6 months  Medications: Outpatient Encounter Medications as of 07/20/2020  Medication Sig Note   acetaminophen (TYLENOL) 325 MG tablet Take 2 tablets (650 mg total) by mouth every 6 (six) hours as needed for mild pain.    aspirin EC 81 MG tablet Take 1 tablet (81 mg total) by mouth daily.    atorvastatin (LIPITOR) 80 MG tablet TAKE 1 TABLET BY MOUTH ONCE DAILY AT  6PM    benazepril (LOTENSIN) 40 MG tablet Take 1 tablet (40 mg total) by mouth daily.    esomeprazole (NEXIUM) 20 MG capsule Take 20 mg by mouth every morning.     EUTHYROX 75 MCG tablet Take 1 tablet by mouth once daily    mesalamine (ROWASA) 4 g enema INSERT 60 ML RECTALLY  AT BEDTIME AS NEEDED    metoprolol tartrate (LOPRESSOR) 25 MG tablet Take 25 mg by mouth 2 (two) times daily.    Multiple Vitamin (MULTIVITAMIN) tablet Take 1 tablet by mouth daily.    nitroGLYCERIN (NITROSTAT) 0.4 MG SL tablet Place 1 tablet (0.4 mg total) under the tongue every 5 (five) minutes as needed for chest pain. 06/10/2020: On hand   sildenafil (VIAGRA) 100 MG tablet Take 0.5-1 tablets (50-100 mg total) by mouth daily as needed for erectile dysfunction.    sulfaSALAzine (AZULFIDINE) 500 MG tablet TAKE 4 TABLETS BY MOUTH 3  TIMES DAILY    No facility-administered encounter medications on file as of  07/20/2020.   Reviewed chart prior to disease state call. Spoke with patient regarding BP  Recent Office Vitals: BP Readings from Last 3 Encounters:  06/16/20 132/72  06/10/20 120/60  05/02/20 122/64   Pulse Readings from Last 3 Encounters:  06/16/20 60  06/10/20 68  05/02/20 62    Wt Readings from Last 3 Encounters:  06/16/20 202 lb (91.6 kg)  06/10/20 210 lb (95.3 kg)  05/02/20 212 lb (96.2 kg)     Kidney Function Lab Results  Component Value Date/Time   CREATININE 1.12 04/18/2020 08:39 AM   CREATININE 0.98 11/04/2018 08:06 AM   GFR 64.53 09/11/2016 12:09 PM   GFRNONAA 64 04/18/2020 08:39 AM   GFRAA 74 04/18/2020 08:39 AM    BMP Latest Ref Rng & Units 04/18/2020 11/04/2018 09/04/2017  Glucose 65 - 99 mg/dL 109(H) 104(H) 104(H)  BUN 7 - 25 mg/dL 16 19 15   Creatinine 0.70 - 1.18 mg/dL 1.12 0.98 1.20  BUN/Creat Ratio 6 - 22 (calc) NOT APPLICABLE NOT APPLICABLE 13  Sodium 332 - 146 mmol/L 141 138 141  Potassium 3.5 - 5.3 mmol/L 5.1 4.6 4.7  Chloride 98 - 110 mmol/L 107 106 105  CO2 20 - 32 mmol/L 26 25 23   Calcium 8.6 - 10.3 mg/dL 9.4 9.4 9.8    Current antihypertensive regimen:  Benazepril 40 mg daily Metoprolol 25 mg one tablet twice daily  How often are you checking your Blood  Pressure? Patient stated its been infrequently.  Current home BP readings: Patient stated he hasn't checked his blood pressure regularly and he was not at home to give me any readings at this time.   What recent interventions/DTPs have been made by any provider to improve Blood Pressure control since last CPP Visit: None.  Any recent hospitalizations or ED visits since last visit with CPP? Patient stated no.  What diet changes have been made to improve Blood Pressure Control?  Patient stated he has been trying to stay away salts and greasy foods at this time.  What exercise is being done to improve your Blood Pressure Control?  Patient stated he walks 3 times a week for about 1.5 miles. He  stated he plays golf on the weekends. He stated he also has been working in the yard.   Adherence Review: Is the patient currently on ACE/ARB medication? Benazepril 40 mg  Does the patient have >5 day gap between last estimated fill dates? Per misc rpts, no.   Star Rating Drugs: Atorvastatin 80 mg 1 tablet daily 07/19/20 90 DS, Benazepril 40 mg 1 tablet daily 06/08/20 90 DS.   Patient stated he does not have any questions or concerns about his medication at this time.    Follow-Up:Pharmacist Review  Charlann Lange, RMA Clinical Pharmacist Assistant [phone]  10 minutes spent in review, coordination, and documentation.  Reviewed by: [name], PharmD Clinical Pharmacist [facility] [phone]

## 2020-09-07 ENCOUNTER — Telehealth: Payer: Self-pay | Admitting: Gastroenterology

## 2020-09-07 DIAGNOSIS — K5100 Ulcerative (chronic) pancolitis without complications: Secondary | ICD-10-CM

## 2020-09-07 MED ORDER — SULFASALAZINE 500 MG PO TABS: ORAL_TABLET | ORAL | 1 refills | Status: DC

## 2020-09-07 MED ORDER — MESALAMINE 4 G RE ENEM: 4.0000 g | ENEMA | Freq: Every day | RECTAL | 11 refills | Status: DC

## 2020-09-07 NOTE — Telephone Encounter (Signed)
Inbound call from patient. Needs refill for Sulfasalzine and Mesalamine. Would like his pharmacy to be changed to [facility] on [address] in [place].

## 2020-09-07 NOTE — Telephone Encounter (Signed)
Mesalamine enema 4 g, once nightly, dispense 30 with 11 refills.  Thank you

## 2020-09-07 NOTE — Telephone Encounter (Signed)
Refill for Sulfasalazine sent.  Please advise on the mesalamine, there is not a sig on the RX.

## 2020-09-07 NOTE — Telephone Encounter (Signed)
RX sent

## 2020-09-21 DIAGNOSIS — Z23 Encounter for immunization: Secondary | ICD-10-CM | POA: Diagnosis not present

## 2020-10-01 ENCOUNTER — Other Ambulatory Visit: Payer: Self-pay | Admitting: Family Medicine

## 2020-10-04 ENCOUNTER — Other Ambulatory Visit: Payer: Self-pay | Admitting: Family Medicine

## 2020-10-11 ENCOUNTER — Telehealth: Payer: Self-pay

## 2020-10-27 ENCOUNTER — Other Ambulatory Visit: Payer: Self-pay | Admitting: Cardiovascular Disease

## 2020-10-27 ENCOUNTER — Telehealth: Payer: Self-pay

## 2020-11-10 ENCOUNTER — Other Ambulatory Visit: Payer: Self-pay | Admitting: Gastroenterology

## 2020-11-17 ENCOUNTER — Other Ambulatory Visit: Payer: Self-pay | Admitting: Cardiovascular Disease

## 2020-12-29 ENCOUNTER — Encounter: Payer: Self-pay | Admitting: Family Medicine

## 2020-12-29 MED ORDER — NIRMATRELVIR/RITONAVIR (PAXLOVID)TABLET: 3.0000 | ORAL_TABLET | Freq: Two times a day (BID) | ORAL | 0 refills | Status: AC

## 2021-01-02 ENCOUNTER — Encounter: Payer: Self-pay | Admitting: Gastroenterology

## 2021-01-11 ENCOUNTER — Telehealth: Payer: Self-pay | Admitting: Pharmacist

## 2021-01-11 NOTE — Progress Notes (Addendum)
Chronic Care Management Pharmacy Assistant   Name: Stuart Gentry  MRN: [id number] DOB: Sep 08, 1945  Reason for Encounter: Disease State For HTN.    Conditions to be addressed/monitored: hypertension, CAD, ulcerative colitis, hypothyroidism, hypercholesterolemia.  Recent office visits:  None since 07/20/20  Recent consult visits:  None since 07/20/20  Hospital visits:  None since 07/20/20  Medications: Outpatient Encounter Medications as of 01/11/2021  Medication Sig Note   acetaminophen (TYLENOL) 325 MG tablet Take 2 tablets (650 mg total) by mouth every 6 (six) hours as needed for mild pain.    aspirin EC 81 MG tablet Take 1 tablet (81 mg total) by mouth daily.    atorvastatin (LIPITOR) 80 MG tablet TAKE 1 TABLET BY MOUTH ONCE DAILY AT  6PM    benazepril (LOTENSIN) 40 MG tablet Take 1 tablet (40 mg total) by mouth daily.    esomeprazole (NEXIUM) 20 MG capsule Take 20 mg by mouth every morning.     EUTHYROX 75 MCG tablet Take 1 tablet by mouth once daily    mesalamine (ROWASA) 4 g enema Place 60 mLs (4 g total) rectally at bedtime.    metoprolol tartrate (LOPRESSOR) 25 MG tablet Take 1 tablet (25 mg total) by mouth 2 (two) times daily.    Multiple Vitamin (MULTIVITAMIN) tablet Take 1 tablet by mouth daily.    nitroGLYCERIN (NITROSTAT) 0.4 MG SL tablet Place 1 tablet (0.4 mg total) under the tongue every 5 (five) minutes as needed for chest pain. 06/10/2020: On hand   sildenafil (VIAGRA) 100 MG tablet Take 0.5-1 tablets (50-100 mg total) by mouth daily as needed for erectile dysfunction.    sulfaSALAzine (AZULFIDINE) 500 MG tablet TAKE 4 TABLETS BY MOUTH 3 TIMES DAILY    No facility-administered encounter medications on file as of 01/11/2021.   Reviewed chart prior to disease state call. Spoke with patient regarding BP  Recent Office Vitals: BP Readings from Last 3 Encounters:  06/16/20 132/72  06/10/20 120/60  05/02/20 122/64   Pulse Readings from Last 3 Encounters:   06/16/20 60  06/10/20 68  05/02/20 62    Wt Readings from Last 3 Encounters:  06/16/20 202 lb (91.6 kg)  06/10/20 210 lb (95.3 kg)  05/02/20 212 lb (96.2 kg)     Kidney Function Lab Results  Component Value Date/Time   CREATININE 1.12 04/18/2020 08:39 AM   CREATININE 0.98 11/04/2018 08:06 AM   GFR 64.53 09/11/2016 12:09 PM   GFRNONAA 64 04/18/2020 08:39 AM   GFRAA 74 04/18/2020 08:39 AM    BMP Latest Ref Rng & Units 04/18/2020 11/04/2018 09/04/2017  Glucose 65 - 99 mg/dL 109(H) 104(H) 104(H)  BUN 7 - 25 mg/dL 16 19 15   Creatinine 0.70 - 1.18 mg/dL 1.12 0.98 1.20  BUN/Creat Ratio 6 - 22 (calc) NOT APPLICABLE NOT APPLICABLE 13  Sodium 578 - 146 mmol/L 141 138 141  Potassium 3.5 - 5.3 mmol/L 5.1 4.6 4.7  Chloride 98 - 110 mmol/L 107 106 105  CO2 20 - 32 mmol/L 26 25 23   Calcium 8.6 - 10.3 mg/dL 9.4 9.4 9.8    Current antihypertensive regimen:  Benazepril 40 mg daily Metoprolol 25 mg one tablet twice daily  How often are you checking your Blood Pressure? Patient infrequently  Current home BP readings: Patient was on vacation and was unable to give me any blood pressure readings.   What recent interventions/DTPs have been made by any provider to improve Blood Pressure control since last CPP Visit: None.  Any recent hospitalizations or ED visits since last visit with CPP? Patient stated no.  What diet changes have been made to improve Blood Pressure Control?  Patient stated his diet as not changed   What exercise is being done to improve your Blood Pressure Control?  Patient stated he is active.   Adherence Review: Is the patient currently on ACE/ARB medication? Benazepril 40 mg  Does the patient have >5 day gap between last estimated fill dates? Per misc rpts, no.  Care Gaps:Patient is overdue for Colonoscopy.   Star Rating Drugs: Atorvastatin 80 mg 10/27/20 90 DS, Benazepril 40 mg 10/27/20 90 DS.   Follow-Up:Pharmacist Review  Charlann Lange, [place]  Pharmacist Assistant [phone]

## 2021-01-23 ENCOUNTER — Encounter: Payer: Self-pay | Admitting: Gastroenterology

## 2021-01-23 ENCOUNTER — Other Ambulatory Visit: Payer: Self-pay | Admitting: Family Medicine

## 2021-03-13 DIAGNOSIS — Z23 Encounter for immunization: Secondary | ICD-10-CM | POA: Diagnosis not present

## 2021-03-16 ENCOUNTER — Other Ambulatory Visit: Payer: Self-pay

## 2021-03-16 ENCOUNTER — Ambulatory Visit (AMBULATORY_SURGERY_CENTER): Payer: Medicare Other

## 2021-03-16 VITALS — Ht 70.0 in | Wt 198.0 lb

## 2021-03-16 DIAGNOSIS — K51919 Ulcerative colitis, unspecified with unspecified complications: Secondary | ICD-10-CM

## 2021-03-16 MED ORDER — PEG 3350-KCL-NA BICARB-NACL 420 G PO SOLR: 4000.0000 mL | Freq: Once | ORAL | 0 refills | Status: AC

## 2021-03-16 NOTE — Progress Notes (Signed)
No egg or soy allergy known to patient  No issues known to pt with past sedation with any surgeries or procedures Patient denies ever being told they had issues or difficulty with intubation  No FH of Malignant Hyperthermia Pt is not on diet pills Pt is not on  home 02  Pt is not on blood thinners   Pt has issues with constipation WITH HIS UC,   Pt states has had 2 day prep in past but feels miralax does not help him clean out.  ADDED TO PREP: Clear liquids starting at noon 11/30. Stop eating nuts, etc x 7 days instead of 5. 2 dulcolox tablets on 11/30 at 6pm.  Pt to take his enemas HS on 11/30 and 12/1.   No A fib or A flutter  Pt is fully vaccinated  for Covid     NO PA's for preps discussed with pt In PV today  Discussed with pt there will be an out-of-pocket cost for prep and that varies from $0 to 70 +  dollars - pt verbalized understanding   Due to the COVID-19 pandemic we are asking patients to follow certain guidelines in PV and the [facility]   Pt aware of COVID protocols and [facility] guidelines

## 2021-03-25 ENCOUNTER — Other Ambulatory Visit: Payer: Self-pay | Admitting: Family Medicine

## 2021-03-27 ENCOUNTER — Other Ambulatory Visit: Payer: Self-pay

## 2021-03-27 MED ORDER — LEVOTHYROXINE SODIUM 75 MCG PO TABS: 75.0000 ug | ORAL_TABLET | Freq: Every day | ORAL | 0 refills | Status: DC

## 2021-03-28 ENCOUNTER — Other Ambulatory Visit: Payer: Self-pay | Admitting: *Deleted

## 2021-03-28 MED ORDER — LEVOTHYROXINE SODIUM 75 MCG PO TABS: 75.0000 ug | ORAL_TABLET | Freq: Every day | ORAL | 0 refills | Status: DC

## 2021-03-30 ENCOUNTER — Other Ambulatory Visit: Payer: Self-pay

## 2021-03-30 MED ORDER — LEVOTHYROXINE SODIUM 75 MCG PO TABS: 75.0000 ug | ORAL_TABLET | Freq: Every day | ORAL | 3 refills | Status: DC

## 2021-03-31 ENCOUNTER — Other Ambulatory Visit: Payer: Self-pay

## 2021-03-31 ENCOUNTER — Encounter: Payer: Self-pay | Admitting: Gastroenterology

## 2021-03-31 ENCOUNTER — Ambulatory Visit (AMBULATORY_SURGERY_CENTER): Payer: Medicare Other | Admitting: Gastroenterology

## 2021-03-31 VITALS — BP 124/62 | HR 55 | Temp 96.0°F | Resp 29 | Ht 70.0 in | Wt 198.0 lb

## 2021-03-31 DIAGNOSIS — D124 Benign neoplasm of descending colon: Secondary | ICD-10-CM

## 2021-03-31 DIAGNOSIS — Z1211 Encounter for screening for malignant neoplasm of colon: Secondary | ICD-10-CM | POA: Diagnosis not present

## 2021-03-31 DIAGNOSIS — K51919 Ulcerative colitis, unspecified with unspecified complications: Secondary | ICD-10-CM | POA: Diagnosis not present

## 2021-03-31 DIAGNOSIS — K529 Noninfective gastroenteritis and colitis, unspecified: Secondary | ICD-10-CM | POA: Diagnosis not present

## 2021-03-31 MED ORDER — SODIUM CHLORIDE 0.9 % IV SOLN: 500.0000 mL | Freq: Once | INTRAVENOUS | Status: DC

## 2021-03-31 NOTE — Progress Notes (Signed)
To PACU, VSS. Report to [email or web address] 

## 2021-03-31 NOTE — Progress Notes (Signed)
HPI: This is a man with UC, elevated risk for CRC   ROS: complete GI ROS as described in HPI, all other review negative.  Constitutional:  No unintentional weight loss   Past Medical History:  Diagnosis Date   Chronic kidney disease 1965   kidney stones   Coronary artery calcification seen on CAT scan    GERD (gastroesophageal reflux disease)    Hypercholesteremia    Hypertension    Hypothyroidism    Euthyrox   Myocardial infarction [facility])    Sleep apnea    never been tested   Ulcerative colitis     Past Surgical History:  Procedure Laterality Date   COLONOSCOPY  2020   has had several due to [place] GRAFT N/A 08/12/2017   Procedure: CORONARY ARTERY BYPASS GRAFTING (CABG) x six , using left internal mammory artery and right leg greater saphenous vein harvested endoscopically.;  Surgeon: Gaye Pollack, MD;  Location: [facility];  Service: Open Heart Surgery;  Laterality: N/A;   LEFT HEART CATH AND CORONARY ANGIOGRAPHY N/A 08/09/2017   Procedure: LEFT HEART CATH AND CORONARY ANGIOGRAPHY;  Surgeon: Burnell Blanks, MD;  Location: [facility];  Service: Cardiovascular;  Laterality: N/A;   SKIN TAG REMOVAL Right 2018   underarm   TEE WITHOUT CARDIOVERSION N/A 08/12/2017   Procedure: TRANSESOPHAGEAL ECHOCARDIOGRAM (TEE);  Surgeon: Gaye Pollack, MD;  Location: [facility];  Service: Open Heart Surgery;  Laterality: N/A;    Current Outpatient Medications  Medication Sig Dispense Refill   aspirin EC 81 MG tablet Take 1 tablet (81 mg total) by mouth daily.     atorvastatin (LIPITOR) 80 MG tablet TAKE 1 TABLET BY MOUTH ONCE DAILY AT  6PM 90 tablet 2   benazepril (LOTENSIN) 40 MG tablet Take 1 tablet by mouth once daily 90 tablet 0   esomeprazole (NEXIUM) 20 MG capsule Take 20 mg by mouth every morning.      levothyroxine (SYNTHROID) 75 MCG tablet Take 1 tablet (75 mcg total) by mouth daily. 90 tablet 3   mesalamine (ROWASA) 4 g enema Place 60 mLs (4 g  total) rectally at bedtime. (Patient taking differently: Place 4 g rectally at bedtime as needed.) 1800 mL 11   metoprolol tartrate (LOPRESSOR) 25 MG tablet Take 1 tablet (25 mg total) by mouth 2 (two) times daily. 180 tablet 2   Multiple Vitamin (MULTIVITAMIN) tablet Take 1 tablet by mouth daily.     sulfaSALAzine (AZULFIDINE) 500 MG tablet TAKE 4 TABLETS BY MOUTH 3 TIMES DAILY 360 tablet 6   acetaminophen (TYLENOL) 325 MG tablet Take 2 tablets (650 mg total) by mouth every 6 (six) hours as needed for mild pain.     nitroGLYCERIN (NITROSTAT) 0.4 MG SL tablet Place 1 tablet (0.4 mg total) under the tongue every 5 (five) minutes as needed for chest pain. (Patient not taking: Reported on 03/31/2021) 25 tablet 1   sildenafil (VIAGRA) 100 MG tablet Take 0.5-1 tablets (50-100 mg total) by mouth daily as needed for erectile dysfunction. 5 tablet 11   Current Facility-Administered Medications  Medication Dose Route Frequency Provider Last Rate Last Admin   0.9 %  sodium chloride infusion  500 mL Intravenous Once Milus Banister, MD        Allergies as of 03/31/2021   (No Known Allergies)    Family History  Problem Relation Age of Onset   Liver cancer Mother    Heart disease Brother  Lymphoma Brother    Kidney disease Brother    Esophageal cancer Brother    Colon cancer Neg Hx    Colon polyps Neg Hx    Rectal cancer Neg Hx    Stomach cancer Neg Hx     Social History   Socioeconomic History   Marital status: Married    Spouse name: Not on file   Number of children: 3   Years of education: Not on file   Highest education level: Not on file  Occupational History   Occupation: Retired  Tobacco Use   Smoking status: Never   Smokeless tobacco: Never  Vaping Use   Vaping Use: Never used  Substance and Sexual Activity   Alcohol use: Yes    Comment: 1-2 cans beer monthly   Drug use: No    Comment: occaasional marijuana in 1960s   Sexual activity: Yes  Other Topics Concern   Not  on file  Social History Narrative   Not on file   Social Determinants of Health   Financial Resource Strain: Not on file  Food Insecurity: Not on file  Transportation Needs: Not on file  Physical Activity: Not on file  Stress: Not on file  Social Connections: Not on file  Intimate Partner Violence: Not on file     Physical Exam: BP (!) 144/66   Pulse (!) 58   Temp (!) 96 F (35.6 C)   Ht 5' 10"  (1.778 m)   Wt 198 lb (89.8 kg)   SpO2 99%   BMI 28.41 kg/m  Constitutional: generally well-appearing Psychiatric: alert and oriented x3 Lungs: CTA bilaterally Heart: no MCR  Assessment and plan: 75 y.o. male with elevated risk for CRC given chronic UC  Colonsocopy today  Care is appropriate for the ambulatory setting.  [name], MD [facility] 03/31/2021, 9:08 AM

## 2021-03-31 NOTE — Progress Notes (Signed)
Called to room to assist during endoscopic procedure.  Patient ID and intended procedure confirmed with present staff. Received instructions for my participation in the procedure from the performing physician.  

## 2021-03-31 NOTE — Op Note (Signed)
[facility] Patient Name: Stuart Gentry Procedure Date: 03/31/2021 9:01 AM MRN: [id number] Endoscopist: Milus Banister , MD Age: 75 Referring MD:  Date of Birth: 1945-06-06 Gender: Male Account #: [id number] Procedure:                Colonoscopy Indications:              Long standing UC, high risk CRC screening Medicines:                Monitored Anesthesia Care Procedure:                Pre-Anesthesia Assessment:                           - Prior to the procedure, a History and Physical                            was performed, and patient medications and                            allergies were reviewed. The patient's tolerance of                            previous anesthesia was also reviewed. The risks                            and benefits of the procedure and the sedation                            options and risks were discussed with the patient.                            All questions were answered, and informed consent                            was obtained. Prior Anticoagulants: The patient has                            taken no previous anticoagulant or antiplatelet                            agents. ASA Grade Assessment: II - A patient with                            mild systemic disease. After reviewing the risks                            and benefits, the patient was deemed in                            satisfactory condition to undergo the procedure.                           After obtaining informed consent, the colonoscope  was passed under direct vision. Throughout the                            procedure, the patient's blood pressure, pulse, and                            oxygen saturations were monitored continuously. The                            Olympus CF-HQ190L ([id number]) Colonoscope was                            introduced through the anus and advanced to the the                            cecum, identified by  appendiceal orifice and                            ileocecal valve. The colonoscopy was performed                            without difficulty. The patient tolerated the                            procedure well. The quality of the bowel                            preparation was good. The terminal ileum, ileocecal                            valve, appendiceal orifice, and rectum were                            photographed. Scope In: 9:21:37 AM Scope Out: 9:42:32 AM Scope Withdrawal Time: 0 hours 11 minutes 34 seconds  Total Procedure Duration: 0 hours 20 minutes 55 seconds  Findings:                 The terminal ileum appeared normal.                           The right colon was normal and was extensively                            biopsied (jar 1).                           A 5 mm polyp was found in the descending colon. The                            polyp was sessile. The polyp was removed with a                            cold snare. Resection and retrieval were complete.  jar 3                           The left colon (to the mid sigmoid) was normal and                            was extensively biopsied (jar 2)                           The rectosigmoid colon was erythematous, edematous                            from anus to about 20cm without a distinct                            transtion to normal appearing mucosa. This was                            biopsied extensively. (jar 4)                           The exam was otherwise without abnormality on                            direct and retroflexion views. Complications:            No immediate complications. Estimated blood loss:                            None. Estimated Blood Loss:     Estimated blood loss: none. Impression:               - The examined portion of the ileum was normal.                           - One 5 mm polyp in the descending colon, removed                            with  a cold snare. Resected and retrieved. jar 3                           - The right colon was normal and was extensively                            biopsied (jar 1).                           - The left colon (to the mid sigmoid) was normal                            and was extensively biopsied (jar 2)                           - The rectosigmoid colon was erythematous,  edematous from anus to about 20cm without a                            distinct transtion to normal appearing mucosa. This                            was biopsied extensively. (jar 4) Recommendation:           - Patient has a contact number available for                            emergencies. The signs and symptoms of potential                            delayed complications were discussed with the                            patient. Return to normal activities tomorrow.                            Written discharge instructions were provided to the                            patient.                           - Resume previous diet.                           - Continue present medications.                           - Await pathology results. Milus Banister, MD 03/31/2021 9:48:24 AM This report has been signed electronically.

## 2021-03-31 NOTE — Patient Instructions (Signed)
Handout provided on polyps.   YOU HAD AN ENDOSCOPIC PROCEDURE TODAY AT [facility]:   Refer to the procedure report that was given to you for any specific questions about what was found during the examination.  If the procedure report does not answer your questions, please call your gastroenterologist to clarify.  If you requested that your care partner not be given the details of your procedure findings, then the procedure report has been included in a sealed envelope for you to review at your convenience later.  YOU SHOULD EXPECT: Some feelings of bloating in the abdomen. Passage of more gas than usual.  Walking can help get rid of the air that was put into your GI tract during the procedure and reduce the bloating. If you had a lower endoscopy (such as a colonoscopy or flexible sigmoidoscopy) you may notice spotting of blood in your stool or on the toilet paper. If you underwent a bowel prep for your procedure, you may not have a normal bowel movement for a few days.  Please Note:  You might notice some irritation and congestion in your nose or some drainage.  This is from the oxygen used during your procedure.  There is no need for concern and it should clear up in a day or so.  SYMPTOMS TO REPORT IMMEDIATELY:  Following lower endoscopy (colonoscopy or flexible sigmoidoscopy):  Excessive amounts of blood in the stool  Significant tenderness or worsening of abdominal pains  Swelling of the abdomen that is new, acute  Fever of 100F or higher  For urgent or emergent issues, a gastroenterologist can be reached at any hour by calling [phone]. Do not use MyChart messaging for urgent concerns.    DIET:  We do recommend a small meal at first, but then you may proceed to your regular diet.  Drink plenty of fluids but you should avoid alcoholic beverages for 24 hours.  ACTIVITY:  You should plan to take it easy for the rest of today and you should NOT DRIVE or use heavy  machinery until tomorrow (because of the sedation medicines used during the test).    FOLLOW UP: Our staff will call the number listed on your records 48-72 hours following your procedure to check on you and address any questions or concerns that you may have regarding the information given to you following your procedure. If we do not reach you, we will leave a message.  We will attempt to reach you two times.  During this call, we will ask if you have developed any symptoms of COVID 19. If you develop any symptoms (ie: fever, flu-like symptoms, shortness of breath, cough etc.) before then, please call [phone].  If you test positive for Covid 19 in the 2 weeks post procedure, please call and report this information to [place].    If any biopsies were taken you will be contacted by phone or by letter within the next 1-3 weeks.  Please call us at [phone] if you have not heard about the biopsies in 3 weeks.    SIGNATURES/CONFIDENTIALITY: You and/or your care partner have signed paperwork which will be entered into your electronic medical record.  These signatures attest to the fact that that the information above on your After Visit Summary has been reviewed and is understood.  Full responsibility of the confidentiality of this discharge information lies with you and/or your care-partner.

## 2021-04-04 ENCOUNTER — Telehealth: Payer: Self-pay

## 2021-04-04 NOTE — Telephone Encounter (Signed)
  Follow up Call-  Call back number 03/31/2021 12/22/2018  Post procedure Call Back phone  # [phone]  Permission to leave phone message Yes Yes  Some recent data might be hidden     Patient questions:  Do you have a fever, pain , or abdominal swelling? No. Pain Score  0 *  Have you tolerated food without any problems? No.  Have you been able to return to your normal activities? Yes.    Do you have any questions about your discharge instructions: Diet   No. Medications  No. Follow up visit  No.  Do you have questions or concerns about your Care? No.  Actions: * If pain score is 4 or above: No action needed, pain <4.

## 2021-04-21 ENCOUNTER — Other Ambulatory Visit: Payer: Self-pay | Admitting: Family Medicine

## 2021-04-28 ENCOUNTER — Other Ambulatory Visit: Payer: Self-pay | Admitting: Gastroenterology

## 2021-05-16 ENCOUNTER — Other Ambulatory Visit: Payer: Self-pay

## 2021-05-16 ENCOUNTER — Other Ambulatory Visit: Payer: Medicare Other

## 2021-05-16 DIAGNOSIS — I10 Essential (primary) hypertension: Secondary | ICD-10-CM

## 2021-05-16 DIAGNOSIS — E7800 Pure hypercholesterolemia, unspecified: Secondary | ICD-10-CM

## 2021-05-16 DIAGNOSIS — E039 Hypothyroidism, unspecified: Secondary | ICD-10-CM

## 2021-05-17 LAB — COMPREHENSIVE METABOLIC PANEL
AG Ratio: 1.7 (calc) (ref 1.0–2.5)
ALT: 17 U/L (ref 9–46)
AST: 17 U/L (ref 10–35)
Albumin: 4 g/dL (ref 3.6–5.1)
Alkaline phosphatase (APISO): 60 U/L (ref 35–144)
BUN: 16 mg/dL (ref 7–25)
CO2: 29 mmol/L (ref 20–32)
Calcium: 9.4 mg/dL (ref 8.6–10.3)
Chloride: 107 mmol/L (ref 98–110)
Creat: 1.15 mg/dL (ref 0.70–1.28)
Globulin: 2.4 g/dL (calc) (ref 1.9–3.7)
Glucose, Bld: 103 mg/dL — ABNORMAL HIGH (ref 65–99)
Potassium: 5.6 mmol/L — ABNORMAL HIGH (ref 3.5–5.3)
Sodium: 140 mmol/L (ref 135–146)
Total Bilirubin: 0.5 mg/dL (ref 0.2–1.2)
Total Protein: 6.4 g/dL (ref 6.1–8.1)

## 2021-05-17 LAB — CBC WITH DIFFERENTIAL/PLATELET
Absolute Monocytes: 627 cells/uL (ref 200–950)
Basophils Absolute: 59 cells/uL (ref 0–200)
Basophils Relative: 0.9 %
Eosinophils Absolute: 158 cells/uL (ref 15–500)
Eosinophils Relative: 2.4 %
HCT: 36.8 % — ABNORMAL LOW (ref 38.5–50.0)
Hemoglobin: 12 g/dL — ABNORMAL LOW (ref 13.2–17.1)
Lymphs Abs: 1393 cells/uL (ref 850–3900)
MCH: 32.6 pg (ref 27.0–33.0)
MCHC: 32.6 g/dL (ref 32.0–36.0)
MCV: 100 fL (ref 80.0–100.0)
MPV: 11 fL (ref 7.5–12.5)
Monocytes Relative: 9.5 %
Neutro Abs: 4363 cells/uL (ref 1500–7800)
Neutrophils Relative %: 66.1 %
Platelets: 196 10*3/uL (ref 140–400)
RBC: 3.68 10*6/uL — ABNORMAL LOW (ref 4.20–5.80)
RDW: 11.4 % (ref 11.0–15.0)
Total Lymphocyte: 21.1 %
WBC: 6.6 10*3/uL (ref 3.8–10.8)

## 2021-05-17 LAB — LIPID PANEL
Cholesterol: 145 mg/dL (ref <200)
HDL: 55 mg/dL (ref >=40)
LDL Cholesterol (Calc): 72 mg/dL (calc)
Non-HDL Cholesterol (Calc): 90 mg/dL (calc) (ref <130)
Total CHOL/HDL Ratio: 2.6 (calc) (ref <5.0)
Triglycerides: 99 mg/dL (ref <150)

## 2021-05-17 LAB — TSH: TSH: 6.48 mIU/L — ABNORMAL HIGH (ref 0.40–4.50)

## 2021-05-23 ENCOUNTER — Other Ambulatory Visit: Payer: Self-pay

## 2021-05-23 ENCOUNTER — Ambulatory Visit (INDEPENDENT_AMBULATORY_CARE_PROVIDER_SITE_OTHER): Payer: Medicare Other | Admitting: Family Medicine

## 2021-05-23 ENCOUNTER — Encounter: Payer: Self-pay | Admitting: Family Medicine

## 2021-05-23 VITALS — BP 128/78 | HR 51 | Temp 97.2°F | Resp 18 | Ht 70.0 in | Wt 208.0 lb

## 2021-05-23 DIAGNOSIS — Z0000 Encounter for general adult medical examination without abnormal findings: Secondary | ICD-10-CM

## 2021-05-23 DIAGNOSIS — Z951 Presence of aortocoronary bypass graft: Secondary | ICD-10-CM

## 2021-05-23 DIAGNOSIS — E039 Hypothyroidism, unspecified: Secondary | ICD-10-CM

## 2021-05-23 DIAGNOSIS — I10 Essential (primary) hypertension: Secondary | ICD-10-CM | POA: Diagnosis not present

## 2021-05-23 DIAGNOSIS — I499 Cardiac arrhythmia, unspecified: Secondary | ICD-10-CM | POA: Diagnosis not present

## 2021-05-23 DIAGNOSIS — E7800 Pure hypercholesterolemia, unspecified: Secondary | ICD-10-CM

## 2021-05-23 DIAGNOSIS — K51919 Ulcerative colitis, unspecified with unspecified complications: Secondary | ICD-10-CM

## 2021-05-23 DIAGNOSIS — Z125 Encounter for screening for malignant neoplasm of prostate: Secondary | ICD-10-CM

## 2021-05-23 LAB — PSA: PSA: 2.38 ng/mL (ref <=4.00)

## 2021-05-23 MED ORDER — LEVOTHYROXINE SODIUM 100 MCG PO TABS: 100.0000 ug | ORAL_TABLET | Freq: Every day | ORAL | 3 refills | Status: DC

## 2021-05-23 NOTE — Progress Notes (Signed)
Subjective:    Patient ID: Stuart Gentry, male    DOB: 1945/09/13, 76 y.o.   MRN: [id number]  HPI Patient is here for CPE. PMH is significant for severe 3 vessel disease s/p CABG with Dr Cyndia Bent on 08/12/17.  Post op course complicated by PAF converted with amiodarone and no anticoagulation needed.  Today on exam, the patient has sinus bradycardia.  He also has an irregular heartbeat.  Therefore I did check an EKG to ensure that he is not in atrial fibrillation.  EKG shows sinus bradycardia with P waves before every QRS complex however he does have an occasional PAC.  Patient is asymptomatic.  He does have some balance issues but he states that his balance is a constant problem and does not have any bearing on orthostatic hypotension.  Otherwise he is doing well with no concerns.  He denies any falls or depression or memory loss.  Most recent lab work is listed below.  He had a colonoscopy in December of this year which showed 1 tubular adenoma.  He is due for a PSA. Immunization History  Administered Date(s) Administered   Fluad Quad(high Dose 65+) 01/26/2020   Influenza Whole 01/29/2007   Influenza, High Dose Seasonal PF 02/15/2016, 03/14/2017, 04/04/2018, 01/28/2019, 03/13/2021   Influenza,inj,Quad PF,6+ Mos 03/21/2015   PFIZER Comirnaty(Gray Top)Covid-19 Tri-Sucrose Vaccine 09/21/2020   PFIZER(Purple Top)SARS-COV-2 Vaccination 05/20/2019, 06/10/2019, 01/21/2020   Pfizer Covid-19 Vaccine Bivalent Booster 72yrs & up 03/13/2021   Pneumococcal Conjugate-13 07/07/2013   Pneumococcal Polysaccharide-23 11/29/2007, 03/21/2015   Td 04/30/2005     Appointment on 05/16/2021  Component Date Value Ref Range Status   TSH 05/16/2021 6.48 (H)  0.40 - 4.50 mIU/L Final   Cholesterol 05/16/2021 145  <200 mg/dL Final   HDL 05/16/2021 55  > OR = 40 mg/dL Final   Triglycerides 05/16/2021 99  <150 mg/dL Final   LDL Cholesterol (Calc) 05/16/2021 72  mg/dL (calc) Final   Comment: Reference range:  <100 . Desirable range <100 mg/dL for primary prevention;   <70 mg/dL for patients with CHD or diabetic patients  with > or = 2 CHD risk factors. Marland Kitchen LDL-C is now calculated using the Martin-Hopkins  calculation, which is a validated novel method providing  better accuracy than the Friedewald equation in the  estimation of LDL-C.  Cresenciano Genre et al. Annamaria Helling. 5681;275(17): 2061-2068  (http://education.[facility].com/faq/FAQ164)    Total CHOL/HDL Ratio 05/16/2021 2.6  <5.0 (calc) Final   Non-HDL Cholesterol (Calc) 05/16/2021 90  <130 mg/dL (calc) Final   Comment: For patients with diabetes plus 1 major ASCVD risk  factor, treating to a non-HDL-C goal of <100 mg/dL  (LDL-C of <70 mg/dL) is considered a therapeutic  option.    Glucose, Bld 05/16/2021 103 (H)  65 - 99 mg/dL Final   Comment: .            Fasting reference interval . For someone without known diabetes, a glucose value between 100 and 125 mg/dL is consistent with prediabetes and should be confirmed with a follow-up test. .    BUN 05/16/2021 16  7 - 25 mg/dL Final   Creat 05/16/2021 1.15  0.70 - 1.28 mg/dL Final   BUN/Creatinine Ratio 00/17/4944 NOT APPLICABLE  6 - 22 (calc) Final   Sodium 05/16/2021 140  135 - 146 mmol/L Final   Potassium 05/16/2021 5.6 (H)  3.5 - 5.3 mmol/L Final   Chloride 05/16/2021 107  98 - 110 mmol/L Final   CO2 05/16/2021 29  20 - 32 mmol/L Final   Calcium 05/16/2021 9.4  8.6 - 10.3 mg/dL Final   Total Protein 05/16/2021 6.4  6.1 - 8.1 g/dL Final   Albumin 05/16/2021 4.0  3.6 - 5.1 g/dL Final   Globulin 05/16/2021 2.4  1.9 - 3.7 g/dL (calc) Final   AG Ratio 05/16/2021 1.7  1.0 - 2.5 (calc) Final   Total Bilirubin 05/16/2021 0.5  0.2 - 1.2 mg/dL Final   Alkaline phosphatase (APISO) 05/16/2021 60  35 - 144 U/L Final   AST 05/16/2021 17  10 - 35 U/L Final   ALT 05/16/2021 17  9 - 46 U/L Final   WBC 05/16/2021 6.6  3.8 - 10.8 Thousand/uL Final   RBC 05/16/2021 3.68 (L)  4.20 - 5.80 Million/uL  Final   Hemoglobin 05/16/2021 12.0 (L)  13.2 - 17.1 g/dL Final   HCT 05/16/2021 36.8 (L)  38.5 - 50.0 % Final   MCV 05/16/2021 100.0  80.0 - 100.0 fL Final   MCH 05/16/2021 32.6  27.0 - 33.0 pg Final   MCHC 05/16/2021 32.6  32.0 - 36.0 g/dL Final   RDW 05/16/2021 11.4  11.0 - 15.0 % Final   Platelets 05/16/2021 196  140 - 400 Thousand/uL Final   MPV 05/16/2021 11.0  7.5 - 12.5 fL Final   Neutro Abs 05/16/2021 4,363  1,500 - 7,800 cells/uL Final   Lymphs Abs 05/16/2021 1,393  850 - 3,900 cells/uL Final   Absolute Monocytes 05/16/2021 627  200 - 950 cells/uL Final   Eosinophils Absolute 05/16/2021 158  15 - 500 cells/uL Final   Basophils Absolute 05/16/2021 59  0 - 200 cells/uL Final   Neutrophils Relative % 05/16/2021 66.1  % Final   Total Lymphocyte 05/16/2021 21.1  % Final   Monocytes Relative 05/16/2021 9.5  % Final   Eosinophils Relative 05/16/2021 2.4  % Final   Basophils Relative 05/16/2021 0.9  % Final   His immunizations are listed below: Immunization History  Administered Date(s) Administered   Fluad Quad(high Dose 65+) 01/26/2020   Influenza Whole 01/29/2007   Influenza, High Dose Seasonal PF 02/15/2016, 03/14/2017, 04/04/2018, 01/28/2019, 03/13/2021   Influenza,inj,Quad PF,6+ Mos 03/21/2015   PFIZER Comirnaty(Gray Top)Covid-19 Tri-Sucrose Vaccine 09/21/2020   PFIZER(Purple Top)SARS-COV-2 Vaccination 05/20/2019, 06/10/2019, 01/21/2020   Pfizer Covid-19 Vaccine Bivalent Booster 40yrs & up 03/13/2021   Pneumococcal Conjugate-13 07/07/2013   Pneumococcal Polysaccharide-23 11/29/2007, 03/21/2015   Td 04/30/2005    Past Medical History:  Diagnosis Date   Chronic kidney disease 1965   kidney stones   Coronary artery calcification seen on CAT scan    GERD (gastroesophageal reflux disease)    Hypercholesteremia    Hypertension    Hypothyroidism    Euthyrox   Myocardial infarction ([place])    Sleep apnea    never been tested   Ulcerative colitis    Past Surgical  History:  Procedure Laterality Date   COLONOSCOPY  2020   has had several due to UC   CORONARY ARTERY BYPASS GRAFT N/A 08/12/2017   Procedure: CORONARY ARTERY BYPASS GRAFTING (CABG) x six , using left internal mammory artery and right leg greater saphenous vein harvested endoscopically.;  Surgeon: Gaye Pollack, MD;  [facility];  Service: Open Heart Surgery;  Laterality: N/A;   LEFT HEART CATH AND CORONARY ANGIOGRAPHY N/A 08/09/2017   Procedure: LEFT HEART CATH AND CORONARY ANGIOGRAPHY;  Surgeon: Burnell Blanks, MD;  Location: [facility];  Service: Cardiovascular;  Laterality: N/A;   SKIN  TAG REMOVAL Right 2018   underarm   TEE WITHOUT CARDIOVERSION N/A 08/12/2017   Procedure: TRANSESOPHAGEAL ECHOCARDIOGRAM (TEE);  Surgeon: Gaye Pollack, MD;  Location: [facility];  Service: Open Heart Surgery;  Laterality: N/A;   Current Outpatient Medications on File Prior to Visit  Medication Sig Dispense Refill   acetaminophen (TYLENOL) 325 MG tablet Take 2 tablets (650 mg total) by mouth every 6 (six) hours as needed for mild pain.     aspirin EC 81 MG tablet Take 1 tablet (81 mg total) by mouth daily.     atorvastatin (LIPITOR) 80 MG tablet TAKE 1 TABLET BY MOUTH ONCE DAILY AT  6PM 90 tablet 2   benazepril (LOTENSIN) 40 MG tablet Take 1 tablet by mouth once daily 90 tablet 0   esomeprazole (NEXIUM) 20 MG capsule Take 20 mg by mouth every morning.      mesalamine (ROWASA) 4 g enema Place 60 mLs (4 g total) rectally at bedtime. (Patient taking differently: Place 4 g rectally at bedtime as needed.) 1800 mL 11   metoprolol tartrate (LOPRESSOR) 25 MG tablet Take 1 tablet (25 mg total) by mouth 2 (two) times daily. 180 tablet 2   Multiple Vitamin (MULTIVITAMIN) tablet Take 1 tablet by mouth daily.     nitroGLYCERIN (NITROSTAT) 0.4 MG SL tablet Place 1 tablet (0.4 mg total) under the tongue every 5 (five) minutes as needed for chest pain. 25 tablet 1   sulfaSALAzine (AZULFIDINE) 500 MG  tablet TAKE 4 TABLETS BY MOUTH 3 TIMES DAILY 1080 tablet 2   No current facility-administered medications on file prior to visit.   No Known Allergies Social History   Socioeconomic History   Marital status: Married    Spouse name: Not on file   Number of children: 3   Years of education: Not on file   Highest education level: Not on file  Occupational History   Occupation: Retired  Tobacco Use   Smoking status: Never   Smokeless tobacco: Never  Vaping Use   Vaping Use: Never used  Substance and Sexual Activity   Alcohol use: Yes    Comment: 1-2 cans beer monthly   Drug use: No    Comment: occaasional marijuana in 1960s   Sexual activity: Yes  Other Topics Concern   Not on file  Social History Narrative   Not on file   Social Determinants of Health   Financial Resource Strain: Not on file  Food Insecurity: Not on file  Transportation Needs: Not on file  Physical Activity: Not on file  Stress: Not on file  Social Connections: Not on file  Intimate Partner Violence: Not on file     Review of Systems  All other systems reviewed and are negative.     Objective:   Physical Exam Vitals reviewed.  Constitutional:      General: He is not in acute distress.    Appearance: Normal appearance. He is well-developed. He is not ill-appearing, toxic-appearing or diaphoretic.  HENT:     Head: Normocephalic and atraumatic.     Right Ear: Tympanic membrane, ear canal and external ear normal.     Left Ear: Tympanic membrane, ear canal and external ear normal.     Nose: Nose normal.     Mouth/Throat:     Pharynx: No oropharyngeal exudate.  Eyes:     General: No scleral icterus.       Right eye: No discharge.        Left eye: No  discharge.     Conjunctiva/sclera: Conjunctivae normal.     Pupils: Pupils are equal, round, and reactive to light.  Neck:     Thyroid: No thyromegaly.     Vascular: No carotid bruit or JVD.     Trachea: No tracheal deviation.  Cardiovascular:      Rate and Rhythm: Bradycardia present. Rhythm irregular.     Heart sounds: Normal heart sounds. No murmur heard.   No friction rub. No gallop.  Pulmonary:     Effort: Pulmonary effort is normal. No respiratory distress.     Breath sounds: Normal breath sounds. No stridor. No wheezing, rhonchi or rales.  Chest:     Chest wall: No tenderness.  Abdominal:     General: Bowel sounds are normal. There is no distension.     Palpations: Abdomen is soft. There is no mass.     Tenderness: There is no abdominal tenderness. There is no guarding or rebound.     Hernia: A hernia is present.  Musculoskeletal:        General: No tenderness or deformity.     Cervical back: Normal range of motion and neck supple. No rigidity or tenderness.  Lymphadenopathy:     Cervical: No cervical adenopathy.  Skin:    General: Skin is warm.     Coloration: Skin is not jaundiced or pale.     Findings: No bruising, erythema, lesion or rash.  Neurological:     General: No focal deficit present.     Mental Status: He is alert and oriented to person, place, and time. Mental status is at baseline.     Cranial Nerves: No cranial nerve deficit.     Sensory: No sensory deficit.     Motor: No weakness or abnormal muscle tone.     Coordination: Coordination normal.     Deep Tendon Reflexes: Reflexes are normal and symmetric.  Psychiatric:        Behavior: Behavior normal.        Thought Content: Thought content normal.        Judgment: Judgment normal.          Assessment & Plan:  Prostate cancer screening - Plan: PSA  Irregularly irregular cardiac rhythm - Plan: EKG 12-Lead  General medical exam  Primary hypertension  Hypothyroidism, unspecified type  Hypercholesteremia  S/P CABG x 6  Ulcerative colitis with complication, unspecified location [facility]) Patient remains bradycardic.  I recommended that he reduce his metoprolol to 12.5 mg twice daily until he sees his cardiologist.  EKG today shows normal  sinus rhythm without atrial fibrillation.  I will increase his levothyroxine to 100 mcg daily and recheck a TSH in 8 weeks.  At that time I would also repeat a BMP to monitor his hyperkalemia but I suggested the patient reduce his consumption of bananas and orange juice.  Cholesterol is acceptable at 72.  The remainder of his lab work is normal.  I will check a PSA today to screen for prostate cancer.  Colonoscopy was performed in December of last year.  Immunizations are up-to-date except for Shingrix which I recommended to the patient

## 2021-05-31 ENCOUNTER — Ambulatory Visit: Payer: Medicare Other | Admitting: Gastroenterology

## 2021-05-31 ENCOUNTER — Encounter: Payer: Self-pay | Admitting: Gastroenterology

## 2021-05-31 VITALS — BP 140/90 | HR 58 | Ht 70.0 in | Wt 209.0 lb

## 2021-05-31 DIAGNOSIS — K51519 Left sided colitis with unspecified complications: Secondary | ICD-10-CM | POA: Diagnosis not present

## 2021-05-31 MED ORDER — CITRUCEL PO POWD: 1.0000 | Freq: Every day | ORAL | Status: DC

## 2021-05-31 NOTE — Patient Instructions (Addendum)
If you are age 77 or older, your body mass index should be between 23-30. Your Body mass index is 29.99 kg/m. If this is out of the aforementioned range listed, please consider follow up with your Primary Care Provider. ________________________________________________________  The [facility] providers would like to encourage you to use [facility] to communicate with providers for non-urgent requests or questions.  Due to long hold times on the telephone, sending your provider a message by [facility] may be a faster and more efficient way to get a response.  Please allow 48 business hours for a response.  Please remember that this is for non-urgent requests.  _______________________________________________________  Stuart Gentry will be due for colonoscopy in December 2024.  We will contact you to get this scheduled.   Please start taking citrucel (orange flavored) powder fiber supplement.  This may cause some bloating at first but that usually goes away. Begin with a small spoonful and work your way up to a large, heaping spoonful daily over a week.  Please call our office at [phone] or send a MyChart message in 1 to 2 months to report on how you are feeling.  Thank you for entrusting me with your care and choosing [facility].  Dr [name]

## 2021-05-31 NOTE — Progress Notes (Signed)
Review of pertinent gastrointestinal problems:   1. Diagnosed with UC in mid 1980s in [place] bloody diarrhea; Moved to [facility], GI MD Dr. Ladean Raya cared for 1990s; was on prednisone early on in problem. Seems to flare 1-2 per year with urgency, mucous output maybe a bit of blood. Will take rowasa enemas for a month. Chronically on sulfasalazine. He has been getting q two year colonoscopies.Has believes that he has only had a very limited left sided disease. Multiple colonoscopies done over the past 20 years. I reviewed many of them from Dr. Ladean Raya; it is clear that he has had only macroscopic disease in the rectosigmoid colon up to about 25 cm however biopsies of his descending, right colon have also shown changes of chronic inflammatory bowel disease several times. His last colonoscopy outside was January 2013. Colonoscopy 05/2013 [name] terminal ileum was normal. There was clear, moderate inflammation from anus to about 60cm proximal (very indistinct transition to normal mucosa). The right colon was essentially normal appearing. Biopsies showed chronic and active inflammation in left colon; right colon was normal. 06/2012 changed to mesalamine full strength, nightly rowasa.  2015, continued intermittent flares, offered several options for medical management, decided to increase the sulfasalazine to 6 g daily, also mesalamine enemas. Colonoscopy Dr. [name] 10/2016: Normal TI, mild inflammation to 30cm (path quiencent colitis), focal hepatic flexure inflammation (chronic moderately active colitis). Random colon biopsies without dysplasia.  Colonoscopy August 2020 Dr. [name] the terminal ileum was normal, the distal colon was mildly inflamed overtly with discrete transition from abnormal to normal mucosa around 40 cm from the anus.  Extensive biopsies were taken.  Right colon biopsies showed "inactive colitis and colitis" and the left colon showed active mild inflammation.  He was recommended to have follow-up in 1 to 2 months  after adjusting his medicines.  We have not heard from him since.  Colonoscopy December 2022 showed normal terminal ileum single 5 mm tubular adenoma was removed.  Right colon was normal, left colon was normal.  Rectosigmoid was erythematous with a non a discrete transition from normal to abnormal mucosa around 20 cm.  Biopsies from right and left colon were normal, biopsies from rectosigmoid showed chronic inflammation.  I asked that he resume his mesalamine enemas on a daily basis rather than every 2 or 3 days.  HPI: This is a very pleasant 76 year old man who is here with his wife today.  Blood work January 2023 showed CBC normal except for very slightly low hemoglobin at 12.0, TSH was 6.5, complete metabolic profile was normal  He moves his bowels every 2 to 3 days, solid without urgency or bleeding.  He did notice that when he was taking the mesalamine enema on a nightly basis he would move his bowels every day or 2 which was better for him comfort wise.  He only takes the mesalamine enema now every few days.  This sounds often due to cost and because it is inconvenient and unpleasant to do.   ROS: complete GI ROS as described in HPI, all other review negative.  Constitutional:  No unintentional weight loss   Past Medical History:  Diagnosis Date   Chronic kidney disease 1965   kidney stones   Coronary artery calcification seen on CAT scan    GERD (gastroesophageal reflux disease)    Hypercholesteremia    Hypertension    Hypothyroidism    Euthyrox   Myocardial infarction [facility])    Sleep apnea    never been tested  Ulcerative colitis     Past Surgical History:  Procedure Laterality Date   COLONOSCOPY  2020   has had several due to UC   CORONARY ARTERY BYPASS GRAFT N/A 08/12/2017   Procedure: CORONARY ARTERY BYPASS GRAFTING (CABG) x six , using left internal mammory artery and right leg greater saphenous vein harvested endoscopically.;  Surgeon: Gaye Pollack, MD;  Location:  [facility];  Service: Open Heart Surgery;  Laterality: N/A;   LEFT HEART CATH AND CORONARY ANGIOGRAPHY N/A 08/09/2017   Procedure: LEFT HEART CATH AND CORONARY ANGIOGRAPHY;  Surgeon: Burnell Blanks, MD;  Location: [facility];  Service: Cardiovascular;  Laterality: N/A;   SKIN TAG REMOVAL Right 2018   underarm   TEE WITHOUT CARDIOVERSION N/A 08/12/2017   Procedure: TRANSESOPHAGEAL ECHOCARDIOGRAM (TEE);  Surgeon: Gaye Pollack, MD;  Location: [facility];  Service: Open Heart Surgery;  Laterality: N/A;    Current Outpatient Medications  Medication Sig Dispense Refill   acetaminophen (TYLENOL) 325 MG tablet Take 2 tablets (650 mg total) by mouth every 6 (six) hours as needed for mild pain.     aspirin EC 81 MG tablet Take 1 tablet (81 mg total) by mouth daily.     atorvastatin (LIPITOR) 80 MG tablet TAKE 1 TABLET BY MOUTH ONCE DAILY AT  6PM 90 tablet 2   benazepril (LOTENSIN) 40 MG tablet Take 1 tablet by mouth once daily 90 tablet 0   esomeprazole (NEXIUM) 20 MG capsule Take 20 mg by mouth every morning.      levothyroxine (SYNTHROID) 100 MCG tablet Take 1 tablet (100 mcg total) by mouth daily. 90 tablet 3   mesalamine (ROWASA) 4 g enema Place 60 mLs (4 g total) rectally at bedtime. (Patient taking differently: Place 4 g rectally at bedtime as needed.) 1800 mL 11   metoprolol tartrate (LOPRESSOR) 25 MG tablet Take 1 tablet (25 mg total) by mouth 2 (two) times daily. 180 tablet 2   Multiple Vitamin (MULTIVITAMIN) tablet Take 1 tablet by mouth daily.     nitroGLYCERIN (NITROSTAT) 0.4 MG SL tablet Place 1 tablet (0.4 mg total) under the tongue every 5 (five) minutes as needed for chest pain. 25 tablet 1   sulfaSALAzine (AZULFIDINE) 500 MG tablet TAKE 4 TABLETS BY MOUTH 3 TIMES DAILY 1080 tablet 2   No current facility-administered medications for this visit.    Allergies as of 05/31/2021   (No Known Allergies)    Family History  Problem Relation Age of Onset   Liver cancer Mother     Heart disease Brother    Lymphoma Brother    Kidney disease Brother    Esophageal cancer Brother    Colon cancer Neg Hx    Colon polyps Neg Hx    Rectal cancer Neg Hx    Stomach cancer Neg Hx     Social History   Socioeconomic History   Marital status: Married    Spouse name: Not on file   Number of children: 3   Years of education: Not on file   Highest education level: Not on file  Occupational History   Occupation: Retired  Tobacco Use   Smoking status: Never   Smokeless tobacco: Never  Vaping Use   Vaping Use: Never used  Substance and Sexual Activity   Alcohol use: Yes    Comment: 1-2 cans beer monthly   Drug use: No    Comment: occaasional marijuana in 1960s   Sexual activity: Yes  Other Topics Concern  Not on file  Social History Narrative   Not on file   Social Determinants of Health   Financial Resource Strain: Not on file  Food Insecurity: Not on file  Transportation Needs: Not on file  Physical Activity: Not on file  Stress: Not on file  Social Connections: Not on file  Intimate Partner Violence: Not on file     Physical Exam: Wt 209 lb (94.8 kg)    BMI 29.99 kg/m  Constitutional: generally well-appearing Psychiatric: alert and oriented x3 Abdomen: soft, nontender, nondistended, no obvious ascites, no peritoneal signs, normal bowel sounds No peripheral edema noted in lower extremities  Assessment and plan: 76 y.o. male with longstanding ulcerative colitis, predominantly left-sided  He is going to continue his sulfasalazine orally.  He is going to continue taking his enema a few times per week.  He is going to add Citrucel powder fiber supplement to see if that will help his bowel regularity a bit more.  He understands that this does not have any effect on his chronic mild inflammation proctoscopy sigmoiditis.  We will put him in our system for recall colonoscopy, high risk due to longstanding ulcerative colitis in December 2024 and he  understands that I would like to see him in the office around that time before committing to invasive testing because he will be around 77 at that point.  Please see the "Patient Instructions" section for addition details about the plan.  [name], MD [facility] 05/31/2021, 9:00 AM   Total time on date of encounter was 30 minutes (this included time spent preparing to see the patient reviewing records; obtaining and/or reviewing separately obtained history; performing a medically appropriate exam and/or evaluation; counseling and educating the patient and family if present; ordering medications, tests or procedures if applicable; and documenting clinical information in the health record).  2. Upper endoscopy 2013, small 'ulcers in stomach.' Ulcers had healed on nexium daily. Small submucosal lesion in EGD, eventually evaluated at [facility]. DR. Janeice Robinson by EUS, was told it was fatty lesion (lipoma) and it needed no further following. 3. Ischemic colitis (likely) 08/2016: acute abd pain, prominantly inflammed sigmoid segment on CT 08/2016.

## 2021-06-14 NOTE — Progress Notes (Signed)
Date:  06/19/2021   ID:  Stuart Gentry, DOB 09/06/1945, MRN [id number]  PCP:  [name], MD  Cardiologist:   Jenkins Rouge, MD   History of Present Illness:  76 y.o. seen initially 07/2017 for chest pain. CRF;s include HTN, HLD.  Retired from [employer]  Wife indicates he watches tv tood much and sedentary   TTE done 08/10/17 EF 55-60% Trivial AR normal estimated PA pressure Stress testing positive and had cath with CM on 08/09/17 Films reviewed  CTO mid LAD with severe 3 VD Referred for CABG with Dr Cyndia Bent on 08/12/17 LIM[place] to LAD SVG sequential D1/D2 Sequential graft to OM1/OM3 and SVG PDA Post op course complicated by PAFconverted with amiodaroe and no anticoagulation needed  D/C with amiodarone 200 bid now off  Golfing with his grandson  No angina Compliant with meds    Tends to be bradycardic.  Lopressor 25 mg bid  LDL72 on lab review 05/16/21   No angina Doing well Retired since 2008 Primary is Dr [name]   Past Medical History:  Diagnosis Date   Chronic kidney disease 1965   kidney stones   Coronary artery calcification seen on CAT scan    GERD (gastroesophageal reflux disease)    Hypercholesteremia    Hypertension    Hypothyroidism    Euthyrox   Myocardial infarction [facility])    Sleep apnea    never been tested   Ulcerative colitis     Past Surgical History:  Procedure Laterality Date   COLONOSCOPY  2020   has had several due to [place] GRAFT N/A 08/12/2017   Procedure: CORONARY ARTERY BYPASS GRAFTING (CABG) x six , using left internal mammory artery and right leg greater saphenous vein harvested endoscopically.;  Surgeon: Gaye Pollack, MD;  Location: [place];  Service: Open Heart Surgery;  Laterality: N/A;   LEFT HEART CATH AND CORONARY ANGIOGRAPHY N/A 08/09/2017   Procedure: LEFT HEART CATH AND CORONARY ANGIOGRAPHY;  Surgeon: Burnell Blanks, MD;  Location: [place] CV LAB;  Service: Cardiovascular;  Laterality: N/A;   SKIN  TAG REMOVAL Right 2018   underarm   TEE WITHOUT CARDIOVERSION N/A 08/12/2017   Procedure: TRANSESOPHAGEAL ECHOCARDIOGRAM (TEE);  Surgeon: Gaye Pollack, MD;  Location: [place];  Service: Open Heart Surgery;  Laterality: N/A;     Current Outpatient Medications  Medication Sig Dispense Refill   acetaminophen (TYLENOL) 325 MG tablet Take 2 tablets (650 mg total) by mouth every 6 (six) hours as needed for mild pain.     aspirin EC 81 MG tablet Take 1 tablet (81 mg total) by mouth daily.     atorvastatin (LIPITOR) 80 MG tablet TAKE 1 TABLET BY MOUTH ONCE DAILY AT  6PM 90 tablet 2   benazepril (LOTENSIN) 40 MG tablet Take 1 tablet by mouth once daily 90 tablet 0   esomeprazole (NEXIUM) 20 MG capsule Take 20 mg by mouth every morning.      levothyroxine (SYNTHROID) 100 MCG tablet Take 1 tablet (100 mcg total) by mouth daily. 90 tablet 3   mesalamine (ROWASA) 4 g enema Place 60 mLs (4 g total) rectally at bedtime. (Patient taking differently: Place 4 g rectally at bedtime as needed.) 1800 mL 11   methylcellulose (CITRUCEL) oral powder Take 1 packet by mouth daily.     metoprolol tartrate (LOPRESSOR) 25 MG tablet Take 1 tablet (25 mg total) by mouth 2 (two) times daily. (Patient taking differently: Take 12.5  mg by mouth 2 (two) times daily.) 180 tablet 2   Multiple Vitamin (MULTIVITAMIN) tablet Take 1 tablet by mouth daily.     nitroGLYCERIN (NITROSTAT) 0.4 MG SL tablet Place 1 tablet (0.4 mg total) under the tongue every 5 (five) minutes as needed for chest pain. 25 tablet 1   sulfaSALAzine (AZULFIDINE) 500 MG tablet TAKE 4 TABLETS BY MOUTH 3 TIMES DAILY 1080 tablet 2   No current facility-administered medications for this visit.    Allergies:   Patient has no known allergies.    Social History:  The patient  reports that he has never smoked. He has never used smokeless tobacco. He reports current alcohol use. He reports that he does not use drugs.   Family History:  The patient's family  history includes Esophageal cancer in his brother; Heart disease in his brother; Kidney disease in his brother; Liver cancer in his mother; Lymphoma in his brother.    ROS:  Please see the history of present illness.   Otherwise, review of systems are positive for none.   All other systems are reviewed and negative.    PHYSICAL EXAM: VS:  BP 130/70    Pulse (!) 58    Ht 5' 10"  (1.778 m)    Wt 206 lb (93.4 kg)    SpO2 97%    BMI 29.56 kg/m  , BMI Body mass index is 29.56 kg/m.  Affect appropriate Healthy:  appears stated age 36: normal Neck supple with no adenopathy JVP normal no bruits no thyromegaly Lungs clear with no wheezing and good diaphragmatic motion Heart:  S1/S2 no murmur, no rub, gallop or click PMI normal post sternotomy  Abdomen: benighn, BS positve, no tenderness, no AAA no bruit.  No HSM or HJR Distal pulses intact with no bruits No edema Neuro non-focal Skin warm and dry No muscular weakness   EKG:   05/23/21 SR rate 57 RBBB/LAFB   Recent Labs: 05/16/2021: ALT 17; BUN 16; Creat 1.15; Hemoglobin 12.0; Platelets 196; Potassium 5.6; Sodium 140; TSH 6.48    Lipid Panel    Component Value Date/Time   CHOL 145 05/16/2021 0815   CHOL 124 01/14/2018 0836   TRIG 99 05/16/2021 0815   HDL 55 05/16/2021 0815   HDL 51 01/14/2018 0836   CHOLHDL 2.6 05/16/2021 0815   VLDL 20 11/07/2016 0816   LDLCALC 72 05/16/2021 0815      Wt Readings from Last 3 Encounters:  06/19/21 206 lb (93.4 kg)  05/31/21 209 lb (94.8 kg)  05/23/21 208 lb (94.3 kg)      Other studies Reviewed: Additional studies/ records that were reviewed today include: Notes primary 06/18/17 labs, Abdominal CT ECG.    ASSESSMENT AND PLAN:  1. CAD/CABG 07/2017 improved  No angina continue ASA, statin and beta blocker  2. Dyspnea:  EF normal by TTE continue weight loss and risk factor modification    4. HLD:  Continue statin LDL at goal 72  5. PAF:  Maintaining NSR off amiodarone improved  6.  Thyroid:  On replacement TSH normal f/u with Dr [name] [facility] 6/48 05/16/21  7. Ulcerative Colitis :  Long standing Colonoscopy 03/2021 with rectosigmoid inflammation using mesalamine enemas  8. Bradycardia:  Stable Has some SSS with bi fascular block Will decrease lopressor to 12.5 mg bid     Current medicines are reviewed at length with the patient today.  The patient does not have concerns regarding medicines.  The following changes have been made:  Decrease  lopressor to 12.5 mg bid   Labs/ tests ordered today include: None    No orders of the defined types were placed in this encounter.  Time: spent reviewing chart CABG report direct patient interview and writing note 20 minutes    Disposition:   FU with cardiology  In a year      Signed, Jenkins Rouge, MD  06/19/2021 8:54 AM    [facility] [address], [place], [place]  [address] Phone: [phone]; Fax: [phone]

## 2021-06-19 ENCOUNTER — Other Ambulatory Visit: Payer: Self-pay

## 2021-06-19 ENCOUNTER — Ambulatory Visit: Payer: Medicare Other | Admitting: Cardiovascular Disease

## 2021-06-19 VITALS — BP 130/70 | HR 58 | Ht 70.0 in | Wt 206.0 lb

## 2021-06-19 DIAGNOSIS — I10 Essential (primary) hypertension: Secondary | ICD-10-CM

## 2021-06-19 DIAGNOSIS — Z951 Presence of aortocoronary bypass graft: Secondary | ICD-10-CM

## 2021-06-19 DIAGNOSIS — E782 Mixed hyperlipidemia: Secondary | ICD-10-CM

## 2021-06-19 NOTE — Patient Instructions (Signed)
Medication Instructions:  Your physician recommends that you continue on your current medications as directed. Please refer to the Current Medication list given to you today.  *If you need a refill on your cardiac medications before your next appointment, please call your pharmacy*  Lab Work: If you have labs (blood work) drawn today and your tests are completely normal, you will receive your results only by: [place] (if you have MyChart) OR A paper copy in the mail If you have any lab test that is abnormal or we need to change your treatment, we will call you to review the results.  Testing/Procedures: None ordered today.  Follow-Up: At [facility], you and your health needs are our priority.  As part of our continuing mission to provide you with exceptional heart care, we have created designated Provider Care Teams.  These Care Teams include your primary Cardiologist (physician) and Advanced Practice Providers (APPs -  Physician Assistants and Nurse Practitioners) who all work together to provide you with the care you need, when you need it.  We recommend signing up for the patient portal called "MyChart".  Sign up information is provided on this After Visit Summary.  MyChart is used to connect with patients for Virtual Visits (Telemedicine).  Patients are able to view lab/test results, encounter notes, upcoming appointments, etc.  Non-urgent messages can be sent to your provider as well.   To learn more about what you can do with MyChart, go to [email or web address].    Your next appointment:   1 year(s)  The format for your next appointment:   In Person  Provider:   Jenkins Rouge, MD {

## 2021-07-28 ENCOUNTER — Other Ambulatory Visit: Payer: Self-pay | Admitting: Family Medicine

## 2021-07-28 ENCOUNTER — Other Ambulatory Visit: Payer: Self-pay | Admitting: Cardiovascular Disease

## 2021-09-28 ENCOUNTER — Other Ambulatory Visit: Payer: Self-pay | Admitting: Gastroenterology

## 2021-09-28 DIAGNOSIS — K5100 Ulcerative (chronic) pancolitis without complications: Secondary | ICD-10-CM

## 2021-09-29 ENCOUNTER — Encounter: Payer: Self-pay | Admitting: Gastroenterology

## 2021-10-21 ENCOUNTER — Other Ambulatory Visit: Payer: Self-pay | Admitting: Cardiovascular Disease

## 2021-10-24 ENCOUNTER — Encounter: Payer: Self-pay | Admitting: Physician Assistant

## 2021-10-24 ENCOUNTER — Ambulatory Visit: Payer: Medicare Other | Admitting: Physician Assistant

## 2021-10-24 VITALS — BP 142/68 | HR 72 | Ht 69.5 in | Wt 204.4 lb

## 2021-10-24 DIAGNOSIS — K5900 Constipation, unspecified: Secondary | ICD-10-CM

## 2021-10-24 DIAGNOSIS — K51919 Ulcerative colitis, unspecified with unspecified complications: Secondary | ICD-10-CM | POA: Diagnosis not present

## 2021-10-24 MED ORDER — MESALAMINE 1.2 G PO TBEC: 2.4000 g | DELAYED_RELEASE_TABLET | Freq: Every day | ORAL | 3 refills | Status: DC

## 2021-10-24 NOTE — Progress Notes (Signed)
Chief Complaint: History of ulcerative colitis with a complaint of left lower quadrant abdominal pain  Review of pertinent gastrointestinal problems:   1. Diagnosed with UC in mid 1980s in [place] bloody diarrhea; Moved to [facility], GI MD Dr. Jodi Marble cared for 1990s; was on prednisone early on in problem. Seems to flare 1-2 per year with urgency, mucous output maybe a bit of blood. Will take rowasa enemas for a month. Chronically on sulfasalazine. He has been getting q two year colonoscopies.Has believes that he has only had a very limited left sided disease. Multiple colonoscopies done over the past 20 years. I reviewed many of them from Dr. Jodi Marble; it is clear that he has had only macroscopic disease in the rectosigmoid colon up to about 25 cm however biopsies of his descending, right colon have also shown changes of chronic inflammatory bowel disease several times. His last colonoscopy outside was January 2013. Colonoscopy 05/2013 Christella Hartigan terminal ileum was normal. There was clear, moderate inflammation from anus to about 60cm proximal (very indistinct transition to normal mucosa). The right colon was essentially normal appearing. Biopsies showed chronic and active inflammation in left colon; right colon was normal. 06/2012 changed to mesalamine full strength, nightly rowasa.  2015, continued intermittent flares, offered several options for medical management, decided to increase the sulfasalazine to 6 g daily, also mesalamine enemas. Colonoscopy Dr. Christella Hartigan 10/2016: Normal TI, mild inflammation to 30cm (path quiencent colitis), focal hepatic flexure inflammation (chronic moderately active colitis). Random colon biopsies without dysplasia.  Colonoscopy August 2020 Dr. Christella Hartigan the terminal ileum was normal, the distal colon was mildly inflamed overtly with discrete transition from abnormal to normal mucosa around 40 cm from the anus.  Extensive biopsies were taken.  Right colon biopsies showed "inactive colitis and colitis"  and the left colon showed active mild inflammation.  He was recommended to have follow-up in 1 to 2 months after adjusting his medicines.  We have not heard from him since.  Colonoscopy December 2022 showed normal terminal ileum single 5 mm tubular adenoma was removed.  Right colon was normal, left colon was normal.  Rectosigmoid was erythematous with a non a discrete transition from normal to abnormal mucosa around 20 cm.  Biopsies from right and left colon were normal, biopsies from rectosigmoid showed chronic inflammation.  I asked that he resume his mesalamine enemas on a daily basis rather than every 2 or 3 days. 2. Upper endoscopy 2013, small 'ulcers in stomach.' Ulcers had healed on nexium daily. Small submucosal lesion in EGD, eventually evaluated at [facility]. DR. Germain Osgood by EUS, was told it was fatty lesion (lipoma) and it needed no further following. 3. Ischemic colitis (likely) 08/2016: acute abd pain, prominantly inflammed sigmoid segment on CT 08/2016   HPI:    Mr. Stuart Gentry is a 76 year old Caucasian male with a past medical history as listed below including CKD, CAD, GERD and ulcerative colitis, known to Dr. Christella Hartigan, who presents clinic today with a complaint of left lower quadrant abdominal pain.       05/31/2021 patient seen by Dr. Christella Hartigan for follow-up of UC.  At that time moved his bowels every 2 to 3 days without urgency or bleeding.  He had noticed when taking his Mesalamine enema nightly basis he would move his bowels every day or 2 which was better.  At that time he was only taking it every few days.  At that time continued on Sulfasalazine orally and his Enema a few times per week.  He  was going to add Citrucel powder fiber supplement to see if it helps with regularity.  Recommended recall colonoscopy in December 2024.    09/29/2021 patient called and described a dull pain off and on his left lower abdomen.    Today, the patient presents to clinic accompanied by his wife.  He tells me that in mid May he  developed a left sided abdominal pain which seem to be pretty constant. Rated as a 5-6/10.  He eventually called our clinic as above and on his own remembered that he was told to use his Mesalamine enemas on a regular basis, so he started to do this and after about a week and 1/2-2 everything got better.  He is still using his enemas daily and symptoms are doing good.  Does tell me that every once in a while, maybe every couple of days he may feel a little bit of left sided abdominal pain, but this is very insignificant compared to what it was previously.  He still tends to run constipated and tells me he needs to restart his fiber supplement.  His wife tells me that she has started him eating prunes and is trying to make sure that he drinks his water for the day as he often forgets.  Patient asked questions about starting back on Lialda.    Denies fever, chills, weight loss, blood in his stool, nausea, vomiting or symptoms that awaken him from sleep.     Past Medical History:  Diagnosis Date   Chronic kidney disease 1965   kidney stones   Coronary artery calcification seen on CAT scan    GERD (gastroesophageal reflux disease)    Hypercholesteremia    Hypertension    Hypothyroidism    Euthyrox   Myocardial infarction [facility])    Sleep apnea    never been tested   Ulcerative colitis     Past Surgical History:  Procedure Laterality Date   COLONOSCOPY  2020   has had several due to UC   CORONARY ARTERY BYPASS GRAFT N/A 08/12/2017   Procedure: CORONARY ARTERY BYPASS GRAFTING (CABG) x six , using left internal mammory artery and right leg greater saphenous vein harvested endoscopically.;  Surgeon: Alleen Borne, MD;  Location: [facility] OR;  Service: Open Heart Surgery;  Laterality: N/A;   LEFT HEART CATH AND CORONARY ANGIOGRAPHY N/A 08/09/2017   Procedure: LEFT HEART CATH AND CORONARY ANGIOGRAPHY;  Surgeon: Kathleene Hazel, MD;  Location: [facility] INV[facility];  Service: Cardiovascular;   Laterality: N/A;   SKIN TAG REMOVAL Right 2018   underarm   TEE WITHOUT CARDIOVERSION N/A 08/12/2017   Procedure: TRANSESOPHAGEAL ECHOCARDIOGRAM (TEE);  Surgeon: Alleen Borne, MD;  Location: [facility] OR;  Service: Open Heart Surgery;  Laterality: N/A;    Current Outpatient Medications  Medication Sig Dispense Refill   acetaminophen (TYLENOL) 325 MG tablet Take 2 tablets (650 mg total) by mouth every 6 (six) hours as needed for mild pain.     aspirin EC 81 MG tablet Take 1 tablet (81 mg total) by mouth daily.     atorvastatin (LIPITOR) 80 MG tablet TAKE 1 TABLET BY MOUTH ONCE DAILY AT  6PM 90 tablet 1   benazepril (LOTENSIN) 40 MG tablet TAKE 1 TABLET BY MOUTH ONCE DAILY . APPOINTMENT REQUIRED FOR FUTURE REFILLS 90 tablet 1   esomeprazole (NEXIUM) 20 MG capsule Take 20 mg by mouth every morning.      levothyroxine (SYNTHROID) 100 MCG tablet Take 1 tablet (100 mcg  total) by mouth daily. 90 tablet 3   mesalamine (ROWASA) 4 g enema PLACE 60 MLS (4 G TOTAL) RECTALLY AT BEDTIME. 60 mL 3   methylcellulose (CITRUCEL) oral powder Take 1 packet by mouth daily.     metoprolol tartrate (LOPRESSOR) 25 MG tablet Take 1 tablet (25 mg total) by mouth 2 (two) times daily. (Patient taking differently: Take 12.5 mg by mouth 2 (two) times daily.) 180 tablet 2   Multiple Vitamin (MULTIVITAMIN) tablet Take 1 tablet by mouth daily.     sulfaSALAzine (AZULFIDINE) 500 MG tablet TAKE 4 TABLETS BY MOUTH 3 TIMES DAILY 1080 tablet 2   nitroGLYCERIN (NITROSTAT) 0.4 MG SL tablet Place 1 tablet (0.4 mg total) under the tongue every 5 (five) minutes as needed for chest pain. (Patient not taking: Reported on 10/24/2021) 25 tablet 1   No current facility-administered medications for this visit.    Allergies as of 10/24/2021   (No Known Allergies)    Family History  Problem Relation Age of Onset   Liver cancer Mother    Heart disease Brother    Lymphoma Brother    Kidney disease Brother    Esophageal cancer Brother     Colon cancer Neg Hx    Colon polyps Neg Hx    Rectal cancer Neg Hx    Stomach cancer Neg Hx     Social History   Socioeconomic History   Marital status: Married    Spouse name: Not on file   Number of children: 3   Years of education: Not on file   Highest education level: Not on file  Occupational History   Occupation: Retired  Tobacco Use   Smoking status: Never   Smokeless tobacco: Never  Vaping Use   Vaping Use: Never used  Substance and Sexual Activity   Alcohol use: Yes    Comment: 1-2 cans beer monthly   Drug use: No    Comment: occaasional marijuana in 1960s   Sexual activity: Yes  Other Topics Concern   Not on file  Social History Narrative   Not on file   Social Determinants of Health   Financial Resource Strain: Low Risk  (12/01/2019)   Overall Financial Resource Strain (CARDIA)    Difficulty of Paying Living Expenses: Not very hard  Food Insecurity: Not on file  Transportation Needs: Not on file  Physical Activity: Not on file  Stress: Not on file  Social Connections: Not on file  Intimate Partner Violence: Not on file    Review of Systems:    Constitutional: No weight loss, fever or chills Cardiovascular: No chest pain Respiratory: No SOB Gastrointestinal: See HPI and otherwise negative   Physical Exam:  Vital signs: BP (!) 142/68 (BP Location: Left Arm, Patient Position: Sitting, Cuff Size: Normal)   Pulse 72 Comment: irregular  Ht 5' 9.5" (1.765 m) Comment: height measured without shoes  Wt 204 lb 6 oz (92.7 kg)   BMI 29.75 kg/m    Constitutional:   Pleasant Caucasian male appears to be in NAD, Well developed, Well nourished, alert and cooperative Respiratory: Respirations even and unlabored. Lungs clear to auscultation bilaterally.   No wheezes, crackles, or rhonchi.  Cardiovascular: Normal S1, S2. No MRG. Regular rate and rhythm. No peripheral edema, cyanosis or pallor.  Gastrointestinal:  Soft, nondistended, nontender. No rebound or  guarding. Normal bowel sounds. No appreciable masses or hepatomegaly. Rectal:  Not performed.  Psychiatric: Oriented to person, place and time. Demonstrates good judgement and reason without abnormal  affect or behaviors.  RELEVANT LABS AND IMAGING: CBC    Component Value Date/Time   WBC 6.6 05/16/2021 0815   RBC 3.68 (L) 05/16/2021 0815   HGB 12.0 (L) 05/16/2021 0815   HGB 12.0 (L) 09/04/2017 1145   HCT 36.8 (L) 05/16/2021 0815   HCT 35.5 (L) 09/04/2017 1145   PLT 196 05/16/2021 0815   PLT 297 09/04/2017 1145   MCV 100.0 05/16/2021 0815   MCV 94 09/04/2017 1145   MCH 32.6 05/16/2021 0815   MCHC 32.6 05/16/2021 0815   RDW 11.4 05/16/2021 0815   RDW 14.2 09/04/2017 1145   LYMPHSABS 1,393 05/16/2021 0815   LYMPHSABS 1.0 08/06/2017 1325   MONOABS 0.7 09/11/2016 1209   EOSABS 158 05/16/2021 0815   EOSABS 0.1 08/06/2017 1325   BASOSABS 59 05/16/2021 0815   BASOSABS 0.0 08/06/2017 1325    CMP     Component Value Date/Time   NA 140 05/16/2021 0815   NA 141 09/04/2017 1145   K 5.6 (H) 05/16/2021 0815   CL 107 05/16/2021 0815   CO2 29 05/16/2021 0815   GLUCOSE 103 (H) 05/16/2021 0815   BUN 16 05/16/2021 0815   BUN 15 09/04/2017 1145   CREATININE 1.15 05/16/2021 0815   CALCIUM 9.4 05/16/2021 0815   PROT 6.4 05/16/2021 0815   PROT 6.4 01/14/2018 0836   ALBUMIN 3.9 01/14/2018 0836   AST 17 05/16/2021 0815   ALT 17 05/16/2021 0815   ALKPHOS 88 01/14/2018 0836   BILITOT 0.5 05/16/2021 0815   BILITOT 0.3 01/14/2018 0836   GFRNONAA 64 04/18/2020 0839   GFRAA 74 04/18/2020 0839    Assessment: 1.  Ulcerative colitis with flare: Recent left-sided abdominal pain resolved after increasing his Mesalamine enemas to nightly instead of every 4 days, continues on Sulfasalazine 2.  Constipation: Better since resuming his Mesalamine enemas nightly instead of every 4 days  Plan: 1.  Patient would like to try the Lialda again as he thinks it worked better than the Sulfasalazine.  He had  switched before due to cost.  He has called his insurance and figured out how much it would cost him and would like another prescription. 2.  Prescribed Lialda 2.4 g/day (two 1.2 g tablets), #60 with 3 refills.  Discussed with patient that we could increase the dose from here if it does not seem to be doing well for him.  Also discussed that if he gets to the pharmacy and ends up not switching he should call and let us know so that we know what therapy he is taking. 3.  Would continue the Mesalamine enemas nightly to avoid flare of symptoms and help with constipation. 4.  Discussed restarting a fiber supplement.  He had some concerns that the Citrucel had potassium in it, recommended he try possibly Benefiber or Metamucil instead. 5.  Patient to follow in clinic with [place] as needed.  Hyacinth Meeker, PA-C [facility] 10/24/2021, 8:28 AM  Cc: Donita Brooks, MD

## 2021-11-23 ENCOUNTER — Other Ambulatory Visit: Payer: Self-pay | Admitting: Cardiovascular Disease

## 2021-12-28 ENCOUNTER — Other Ambulatory Visit: Payer: Self-pay | Admitting: Gastroenterology

## 2021-12-28 DIAGNOSIS — K5100 Ulcerative (chronic) pancolitis without complications: Secondary | ICD-10-CM

## 2022-01-17 ENCOUNTER — Other Ambulatory Visit: Payer: Self-pay | Admitting: Family Medicine

## 2022-01-17 NOTE — Telephone Encounter (Signed)
Requested medication (s) are due for refill today: yes  Requested medication (s) are on the active medication list: yes  Last refill:  07/28/21 #90 with 1 RF  Future visit scheduled: no, last seen 05/23/21  Notes to clinic:  Given a generous 6 month curtesy refill and has not scheduled appt. Please assess.      Requested Prescriptions  Pending Prescriptions Disp Refills   benazepril (LOTENSIN) 40 MG tablet [Pharmacy Med Name: Benazepril HCl 40 MG Oral Tablet] 90 tablet 0    Sig: TAKE 1 TABLET BY MOUTH ONCE DAILY . APPOINTMENT REQUIRED FOR FUTURE REFILLS     Cardiovascular:  ACE Inhibitors Failed - 01/17/2022  9:22 AM      Failed - Cr in normal range and within 180 days    Creat  Date Value Ref Range Status  05/16/2021 1.15 0.70 - 1.28 mg/dL Final         Failed - K in normal range and within 180 days    Potassium  Date Value Ref Range Status  05/16/2021 5.6 (H) 3.5 - 5.3 mmol/L Final         Failed - Last BP in normal range    BP Readings from Last 1 Encounters:  10/24/21 (!) 142/68         Failed - Valid encounter within last 6 months    Recent Outpatient Visits           7 months ago Prostate cancer screening   [place] [name], MD   1 year ago General medical exam   [place] [name], MD   3 years ago General medical exam   [place] [name], MD   4 years ago General medical exam   [place] [name], MD   5 years ago Pure hypercholesterolemia   [place], Cammie Mcgee, MD              Passed - Patient is not pregnant

## 2022-02-08 ENCOUNTER — Other Ambulatory Visit: Payer: Self-pay | Admitting: Physician Assistant

## 2022-04-10 ENCOUNTER — Telehealth: Payer: Self-pay | Admitting: Family Medicine

## 2022-04-10 NOTE — Telephone Encounter (Signed)
Left message for patient to call back and schedule Medicare Annual Wellness Visit (AWV) in office.   If not able to come in office, please offer to do virtually or by telephone.   Last AWV:05/23/2021   Please schedule at any time with [facility]-Nurse Health Advisor.  30 minute appointment for Virtual or phone  45 minute appointment for in office or Initial virtual/phone  Any questions, please contact me at [phone]

## 2022-05-01 ENCOUNTER — Other Ambulatory Visit: Payer: Self-pay | Admitting: Cardiovascular Disease

## 2022-05-01 ENCOUNTER — Other Ambulatory Visit: Payer: Self-pay | Admitting: Family Medicine

## 2022-05-11 ENCOUNTER — Encounter: Payer: Self-pay | Admitting: Family Medicine

## 2022-05-16 ENCOUNTER — Other Ambulatory Visit: Payer: Self-pay | Admitting: Family Medicine

## 2022-05-16 NOTE — Telephone Encounter (Signed)
Requested Prescriptions  Pending Prescriptions Disp Refills   levothyroxine (SYNTHROID) 100 MCG tablet [Pharmacy Med Name: Levothyroxine Sodium 100 MCG Oral Tablet] 90 tablet 0    Sig: Take 1 tablet by mouth once daily     Endocrinology:  Hypothyroid Agents Failed - 05/16/2022  8:22 AM      Failed - TSH in normal range and within 360 days    TSH  Date Value Ref Range Status  05/16/2021 6.48 (H) 0.40 - 4.50 mIU/L Final         Failed - Valid encounter within last 12 months    Recent Outpatient Visits           11 months ago Prostate cancer screening   [place] [name], MD   2 years ago General medical exam   [place] [name], MD   3 years ago General medical exam   [place] [name], [place]   4 years ago General medical exam   [place] [name], MD   5 years ago Pure hypercholesterolemia   [place] Pickard, Cammie Mcgee, MD       Future Appointments             In 1 week Pickard, Cammie Mcgee, MD [place]

## 2022-05-18 ENCOUNTER — Other Ambulatory Visit: Payer: Self-pay | Admitting: Cardiovascular Disease

## 2022-05-23 ENCOUNTER — Other Ambulatory Visit: Payer: Medicare Other

## 2022-05-23 DIAGNOSIS — I10 Essential (primary) hypertension: Secondary | ICD-10-CM

## 2022-05-23 DIAGNOSIS — Z125 Encounter for screening for malignant neoplasm of prostate: Secondary | ICD-10-CM

## 2022-05-23 DIAGNOSIS — E039 Hypothyroidism, unspecified: Secondary | ICD-10-CM

## 2022-05-23 DIAGNOSIS — E7800 Pure hypercholesterolemia, unspecified: Secondary | ICD-10-CM

## 2022-05-24 LAB — LIPID PANEL
Cholesterol: 133 mg/dL (ref <200)
HDL: 41 mg/dL (ref >=40)
LDL Cholesterol (Calc): 73 mg/dL (calc)
Non-HDL Cholesterol (Calc): 92 mg/dL (calc) (ref <130)
Total CHOL/HDL Ratio: 3.2 (calc) (ref <5.0)
Triglycerides: 100 mg/dL (ref <150)

## 2022-05-24 LAB — CBC WITH DIFFERENTIAL/PLATELET
Absolute Monocytes: 643 cells/uL (ref 200–950)
Basophils Absolute: 83 cells/uL (ref 0–200)
Basophils Relative: 1.4 %
Eosinophils Absolute: 301 cells/uL (ref 15–500)
Eosinophils Relative: 5.1 %
HCT: 38.1 % — ABNORMAL LOW (ref 38.5–50.0)
Hemoglobin: 13.2 g/dL (ref 13.2–17.1)
Lymphs Abs: 1363 cells/uL (ref 850–3900)
MCH: 31.3 pg (ref 27.0–33.0)
MCHC: 34.6 g/dL (ref 32.0–36.0)
MCV: 90.3 fL (ref 80.0–100.0)
MPV: 11.6 fL (ref 7.5–12.5)
Monocytes Relative: 10.9 %
Neutro Abs: 3511 cells/uL (ref 1500–7800)
Neutrophils Relative %: 59.5 %
Platelets: 211 10*3/uL (ref 140–400)
RBC: 4.22 10*6/uL (ref 4.20–5.80)
RDW: 12 % (ref 11.0–15.0)
Total Lymphocyte: 23.1 %
WBC: 5.9 10*3/uL (ref 3.8–10.8)

## 2022-05-24 LAB — COMPLETE METABOLIC PANEL WITH GFR
AG Ratio: 1.4 (calc) (ref 1.0–2.5)
ALT: 23 U/L (ref 9–46)
AST: 20 U/L (ref 10–35)
Albumin: 3.8 g/dL (ref 3.6–5.1)
Alkaline phosphatase (APISO): 99 U/L (ref 35–144)
BUN: 17 mg/dL (ref 7–25)
CO2: 26 mmol/L (ref 20–32)
Calcium: 9.3 mg/dL (ref 8.6–10.3)
Chloride: 107 mmol/L (ref 98–110)
Creat: 1.02 mg/dL (ref 0.70–1.28)
Globulin: 2.7 g/dL (calc) (ref 1.9–3.7)
Glucose, Bld: 102 mg/dL — ABNORMAL HIGH (ref 65–99)
Potassium: 4.7 mmol/L (ref 3.5–5.3)
Sodium: 140 mmol/L (ref 135–146)
Total Bilirubin: 0.6 mg/dL (ref 0.2–1.2)
Total Protein: 6.5 g/dL (ref 6.1–8.1)
eGFR: 76 mL/min/{1.73_m2} (ref >=60)

## 2022-05-24 LAB — PSA: PSA: 1.59 ng/mL (ref <=4.00)

## 2022-05-25 ENCOUNTER — Telehealth: Payer: Self-pay

## 2022-05-25 NOTE — Telephone Encounter (Signed)
Pt declined AWV today. per pt will wait, since going to see pcp on Monday.

## 2022-05-28 ENCOUNTER — Ambulatory Visit (INDEPENDENT_AMBULATORY_CARE_PROVIDER_SITE_OTHER): Payer: Medicare Other | Admitting: Family Medicine

## 2022-05-28 ENCOUNTER — Encounter: Payer: Self-pay | Admitting: Family Medicine

## 2022-05-28 VITALS — BP 124/76 | HR 71 | Temp 97.8°F | Ht 69.5 in | Wt 219.0 lb

## 2022-05-28 DIAGNOSIS — Z0000 Encounter for general adult medical examination without abnormal findings: Secondary | ICD-10-CM

## 2022-05-28 DIAGNOSIS — E039 Hypothyroidism, unspecified: Secondary | ICD-10-CM | POA: Diagnosis not present

## 2022-05-28 DIAGNOSIS — I10 Essential (primary) hypertension: Secondary | ICD-10-CM | POA: Diagnosis not present

## 2022-05-28 DIAGNOSIS — Z125 Encounter for screening for malignant neoplasm of prostate: Secondary | ICD-10-CM | POA: Diagnosis not present

## 2022-05-28 DIAGNOSIS — Z0001 Encounter for general adult medical examination with abnormal findings: Secondary | ICD-10-CM

## 2022-05-28 DIAGNOSIS — Z951 Presence of aortocoronary bypass graft: Secondary | ICD-10-CM

## 2022-05-28 MED ORDER — EZETIMIBE 10 MG PO TABS: 10.0000 mg | ORAL_TABLET | Freq: Every day | ORAL | 3 refills | Status: DC

## 2022-05-28 NOTE — Progress Notes (Signed)
Subjective:    Patient ID: Stuart Gentry, male    DOB: 1946/04/25, 77 y.o.   MRN: [id number]  HPI Patient is here for CPE. PMH is significant for severe 3 vessel disease s/p CABG with Dr Cyndia Bent on 08/12/17.  Post op course complicated by PAF converted with amiodarone and no anticoagulation needed.   He had a colonoscopy in December 2022 which showed 1 tubular adenoma.  Otherwise the patient has been doing well.  He denies any falls or depression or memory loss.  He is due for an RSV vaccine.  He is also due for shingles vaccine however he has had shingles in the past.  Most recent lab work is listed below: Lab on 05/23/2022  Component Date Value Ref Range Status   WBC 05/23/2022 5.9  3.8 - 10.8 Thousand/uL Final   RBC 05/23/2022 4.22  4.20 - 5.80 Million/uL Final   Hemoglobin 05/23/2022 13.2  13.2 - 17.1 g/dL Final   HCT 05/23/2022 38.1 (L)  38.5 - 50.0 % Final   MCV 05/23/2022 90.3  80.0 - 100.0 fL Final   MCH 05/23/2022 31.3  27.0 - 33.0 pg Final   MCHC 05/23/2022 34.6  32.0 - 36.0 g/dL Final   RDW 05/23/2022 12.0  11.0 - 15.0 % Final   Platelets 05/23/2022 211  140 - 400 Thousand/uL Final   MPV 05/23/2022 11.6  7.5 - 12.5 fL Final   Neutro Abs 05/23/2022 3,511  1,500 - 7,800 cells/uL Final   Lymphs Abs 05/23/2022 1,363  850 - 3,900 cells/uL Final   Absolute Monocytes 05/23/2022 643  200 - 950 cells/uL Final   Eosinophils Absolute 05/23/2022 301  15 - 500 cells/uL Final   Basophils Absolute 05/23/2022 83  0 - 200 cells/uL Final   Neutrophils Relative % 05/23/2022 59.5  % Final   Total Lymphocyte 05/23/2022 23.1  % Final   Monocytes Relative 05/23/2022 10.9  % Final   Eosinophils Relative 05/23/2022 5.1  % Final   Basophils Relative 05/23/2022 1.4  % Final   Glucose, Bld 05/23/2022 102 (H)  65 - 99 mg/dL Final   Comment: .            Fasting reference interval . For someone without known diabetes, a glucose value between 100 and 125 mg/dL is consistent with prediabetes and  should be confirmed with a follow-up test. .    BUN 05/23/2022 17  7 - 25 mg/dL Final   Creat 05/23/2022 1.02  0.70 - 1.28 mg/dL Final   eGFR 05/23/2022 76  > OR = 60 mL/min/1.57m Final   BUN/Creatinine Ratio 05/23/2022 SEE NOTE:  6 - 22 (calc) Final   Comment:    Not Reported: BUN and Creatinine are within    reference range. .    Sodium 05/23/2022 140  135 - 146 mmol/L Final   Potassium 05/23/2022 4.7  3.5 - 5.3 mmol/L Final   Chloride 05/23/2022 107  98 - 110 mmol/L Final   CO2 05/23/2022 26  20 - 32 mmol/L Final   Calcium 05/23/2022 9.3  8.6 - 10.3 mg/dL Final   Total Protein 05/23/2022 6.5  6.1 - 8.1 g/dL Final   Albumin 05/23/2022 3.8  3.6 - 5.1 g/dL Final   Globulin 05/23/2022 2.7  1.9 - 3.7 g/dL (calc) Final   AG Ratio 05/23/2022 1.4  1.0 - 2.5 (calc) Final   Total Bilirubin 05/23/2022 0.6  0.2 - 1.2 mg/dL Final   Alkaline phosphatase (APISO) 05/23/2022 99  35 -  144 U/L Final   AST 05/23/2022 20  10 - 35 U/L Final   ALT 05/23/2022 23  9 - 46 U/L Final   Cholesterol 05/23/2022 133  <200 mg/dL Final   HDL 05/23/2022 41  > OR = 40 mg/dL Final   Triglycerides 05/23/2022 100  <150 mg/dL Final   LDL Cholesterol (Calc) 05/23/2022 73  mg/dL (calc) Final   Comment: Reference range: <100 . Desirable range <100 mg/dL for primary prevention;   <70 mg/dL for patients with CHD or diabetic patients  with > or = 2 CHD risk factors. Marland Kitchen LDL-C is now calculated using the Martin-Hopkins  calculation, which is a validated novel method providing  better accuracy than the Friedewald equation in the  estimation of LDL-C.  Cresenciano Genre et al. Annamaria Helling. 9528;413(24): 2061-2068  (http://education.[facility].com/faq/FAQ164)    Total CHOL/HDL Ratio 05/23/2022 3.2  <5.0 (calc) Final   Non-HDL Cholesterol (Calc) 05/23/2022 92  <130 mg/dL (calc) Final   Comment: For patients with diabetes plus 1 major ASCVD risk  factor, treating to a non-HDL-C goal of <100 mg/dL  (LDL-C of <70 mg/dL) is  considered a therapeutic  option.    PSA 05/23/2022 1.59  < OR = 4.00 ng/mL Final   Comment: The total PSA value from this assay system is  standardized against the WHO standard. The test  result will be approximately 20% lower when compared  to the equimolar-standardized total PSA (Beckman  Coulter). Comparison of serial PSA results should be  interpreted with this fact in mind. . This test was performed using the Siemens  chemiluminescent method. Values obtained from  different assay methods cannot be used interchangeably. PSA levels, regardless of value, should not be interpreted as absolute evidence of the presence or absence of disease.     Immunization History  Administered Date(s) Administered   Fluad Quad(high Dose 65+) 01/26/2020   Influenza Whole 01/29/2007   Influenza, High Dose Seasonal PF 02/15/2016, 03/14/2017, 04/04/2018, 01/28/2019, 03/13/2021   Influenza,inj,Quad PF,6+ Mos 03/21/2015   Influenza-Unspecified 01/14/2022   PFIZER Comirnaty(Gray Top)Covid-19 Tri-Sucrose Vaccine 09/21/2020   PFIZER(Purple Top)SARS-COV-2 Vaccination 05/20/2019, 06/10/2019, 01/21/2020   Pfizer Covid-19 Vaccine Bivalent Booster 67yr & up 03/13/2021   Pneumococcal Conjugate-13 07/07/2013   Pneumococcal Polysaccharide-23 11/29/2007, 03/21/2015   Td 04/30/2005   Unspecified SARS-COV-2 Vaccination 01/14/2022     Lab on 05/23/2022  Component Date Value Ref Range Status   WBC 05/23/2022 5.9  3.8 - 10.8 Thousand/uL Final   RBC 05/23/2022 4.22  4.20 - 5.80 Million/uL Final   Hemoglobin 05/23/2022 13.2  13.2 - 17.1 g/dL Final   HCT 05/23/2022 38.1 (L)  38.5 - 50.0 % Final   MCV 05/23/2022 90.3  80.0 - 100.0 fL Final   MCH 05/23/2022 31.3  27.0 - 33.0 pg Final   MCHC 05/23/2022 34.6  32.0 - 36.0 g/dL Final   RDW 05/23/2022 12.0  11.0 - 15.0 % Final   Platelets 05/23/2022 211  140 - 400 Thousand/uL Final   MPV 05/23/2022 11.6  7.5 - 12.5 fL Final   Neutro Abs 05/23/2022 3,511  1,500 -  7,800 cells/uL Final   Lymphs Abs 05/23/2022 1,363  850 - 3,900 cells/uL Final   Absolute Monocytes 05/23/2022 643  200 - 950 cells/uL Final   Eosinophils Absolute 05/23/2022 301  15 - 500 cells/uL Final   Basophils Absolute 05/23/2022 83  0 - 200 cells/uL Final   Neutrophils Relative % 05/23/2022 59.5  % Final   Total Lymphocyte 05/23/2022 23.1  %  Final   Monocytes Relative 05/23/2022 10.9  % Final   Eosinophils Relative 05/23/2022 5.1  % Final   Basophils Relative 05/23/2022 1.4  % Final   Glucose, Bld 05/23/2022 102 (H)  65 - 99 mg/dL Final   Comment: .            Fasting reference interval . For someone without known diabetes, a glucose value between 100 and 125 mg/dL is consistent with prediabetes and should be confirmed with a follow-up test. .    BUN 05/23/2022 17  7 - 25 mg/dL Final   Creat 05/23/2022 1.02  0.70 - 1.28 mg/dL Final   eGFR 05/23/2022 76  > OR = 60 mL/min/1.36m Final   BUN/Creatinine Ratio 05/23/2022 SEE NOTE:  6 - 22 (calc) Final   Comment:    Not Reported: BUN and Creatinine are within    reference range. .    Sodium 05/23/2022 140  135 - 146 mmol/L Final   Potassium 05/23/2022 4.7  3.5 - 5.3 mmol/L Final   Chloride 05/23/2022 107  98 - 110 mmol/L Final   CO2 05/23/2022 26  20 - 32 mmol/L Final   Calcium 05/23/2022 9.3  8.6 - 10.3 mg/dL Final   Total Protein 05/23/2022 6.5  6.1 - 8.1 g/dL Final   Albumin 05/23/2022 3.8  3.6 - 5.1 g/dL Final   Globulin 05/23/2022 2.7  1.9 - 3.7 g/dL (calc) Final   AG Ratio 05/23/2022 1.4  1.0 - 2.5 (calc) Final   Total Bilirubin 05/23/2022 0.6  0.2 - 1.2 mg/dL Final   Alkaline phosphatase (APISO) 05/23/2022 99  35 - 144 U/L Final   AST 05/23/2022 20  10 - 35 U/L Final   ALT 05/23/2022 23  9 - 46 U/L Final   Cholesterol 05/23/2022 133  <200 mg/dL Final   HDL 05/23/2022 41  > OR = 40 mg/dL Final   Triglycerides 05/23/2022 100  <150 mg/dL Final   LDL Cholesterol (Calc) 05/23/2022 73  mg/dL (calc) Final   Comment:  Reference range: <100 . Desirable range <100 mg/dL for primary prevention;   <70 mg/dL for patients with CHD or diabetic patients  with > or = 2 CHD risk factors. .Marland KitchenLDL-C is now calculated using the Martin-Hopkins  calculation, which is a validated novel method providing  better accuracy than the Friedewald equation in the  estimation of LDL-C.  MCresenciano Genreet al. JAnnamaria Helling 27672;094(70: 2061-2068  (http://education.[facility].com/faq/FAQ164)    Total CHOL/HDL Ratio 05/23/2022 3.2  <5.0 (calc) Final   Non-HDL Cholesterol (Calc) 05/23/2022 92  <130 mg/dL (calc) Final   Comment: For patients with diabetes plus 1 major ASCVD risk  factor, treating to a non-HDL-C goal of <100 mg/dL  (LDL-C of <70 mg/dL) is considered a therapeutic  option.    PSA 05/23/2022 1.59  < OR = 4.00 ng/mL Final   Comment: The total PSA value from this assay system is  standardized against the WHO standard. The test  result will be approximately 20% lower when compared  to the equimolar-standardized total PSA (Beckman  Coulter). Comparison of serial PSA results should be  interpreted with this fact in mind. . This test was performed using the Siemens  chemiluminescent method. Values obtained from  different assay methods cannot be used interchangeably. PSA levels, regardless of value, should not be interpreted as absolute evidence of the presence or absence of disease.    Immunization History  Administered Date(s) Administered   Fluad Quad(high Dose 65+) 01/26/2020  Influenza Whole 01/29/2007   Influenza, High Dose Seasonal PF 02/15/2016, 03/14/2017, 04/04/2018, 01/28/2019, 03/13/2021   Influenza,inj,Quad PF,6+ Mos 03/21/2015   Influenza-Unspecified 01/14/2022   PFIZER Comirnaty(Gray Top)Covid-19 Tri-Sucrose Vaccine 09/21/2020   PFIZER(Purple Top)SARS-COV-2 Vaccination 05/20/2019, 06/10/2019, 01/21/2020   Pfizer Covid-19 Vaccine Bivalent Booster 10yr & up 03/13/2021   Pneumococcal Conjugate-13  07/07/2013   Pneumococcal Polysaccharide-23 11/29/2007, 03/21/2015   Td 04/30/2005   Unspecified SARS-COV-2 Vaccination 01/14/2022    Past Medical History:  Diagnosis Date   Chronic kidney disease 1965   kidney stones   Coronary artery calcification seen on CAT scan    GERD (gastroesophageal reflux disease)    Hypercholesteremia    Hypertension    Hypothyroidism    Euthyrox   Myocardial infarction ([facility]    Sleep apnea    never been tested   Ulcerative colitis    Past Surgical History:  Procedure Laterality Date   COLONOSCOPY  2020   has had several due to USolisGRAFT N/A 08/12/2017   Procedure: CORONARY ARTERY BYPASS GRAFTING (CABG) x six , using left internal mammory artery and right leg greater saphenous vein harvested endoscopically.;  Surgeon: BGaye Pollack MD;  [place]  Service: Open [place] Surgery;  Laterality: N/A;   LEFT HEART CATH AND CORONARY ANGIOGRAPHY N/A 08/09/2017   Procedure: LEFT HEART CATH AND CORONARY ANGIOGRAPHY;  Surgeon: MBurnell Blanks MD;  [facility];  Service: Cardiovascular;  Laterality: N/A;   SKIN TAG REMOVAL Right 2018   underarm   TEE WITHOUT CARDIOVERSION N/A 08/12/2017   Procedure: TRANSESOPHAGEAL ECHOCARDIOGRAM (TEE);  Surgeon: BGaye Pollack MD;  [place]  Service: Open Heart Surgery;  Laterality: N/A;   Current Outpatient Medications on File Prior to Visit  Medication Sig Dispense Refill   acetaminophen (TYLENOL) 325 MG tablet Take 2 tablets (650 mg total) by mouth every 6 (six) hours as needed for mild pain.     aspirin EC 81 MG tablet Take 1 tablet (81 mg total) by mouth daily.     atorvastatin (LIPITOR) 80 MG tablet TAKE 1 TABLET BY MOUTH ONCE DAILY AT  6PM 60 tablet 0   benazepril (LOTENSIN) 40 MG tablet TAKE 1 TABLET BY MOUTH ONCE DAILY (APPOINTMENT  REQUIRED  FOR  FUTURE  REFILLS) 90 tablet 0   esomeprazole (NEXIUM) 20 MG capsule Take 20 mg by mouth every morning.       levothyroxine (SYNTHROID) 100 MCG tablet Take 1 tablet by mouth once daily 90 tablet 0   mesalamine (LIALDA) 1.2 g EC tablet TAKE 2 TABLETS BY MOUTH ONCE DAILY IN THE MORNING WITH BREAKFAST 60 tablet 3   mesalamine (ROWASA) 4 g enema PLACE 60 MLS (4 G TOTAL) RECTALLY AT BEDTIME. 5400 mL 1   metoprolol tartrate (LOPRESSOR) 25 MG tablet Take 0.5 tablets (12.5 mg total) by mouth 2 (two) times daily. 90 tablet 0   Multiple Vitamin (MULTIVITAMIN) tablet Take 1 tablet by mouth daily.     nitroGLYCERIN (NITROSTAT) 0.4 MG SL tablet Place 1 tablet (0.4 mg total) under the tongue every 5 (five) minutes as needed for chest pain. 25 tablet 1   No current facility-administered medications on file prior to visit.   No Known Allergies Social History   Socioeconomic History   Marital status: Married    Spouse name: Not on file   Number of children: 3   Years of education: Not on file   Highest education level: Not on  file  Occupational History   Occupation: Retired  Tobacco Use   Smoking status: Never   Smokeless tobacco: Never  Vaping Use   Vaping Use: Never used  Substance and Sexual Activity   Alcohol use: Yes    Comment: 1-2 cans beer monthly   Drug use: No    Comment: occaasional marijuana in 1960s   Sexual activity: Yes  Other Topics Concern   Not on file  Social History Narrative   Not on file   Social Determinants of Health   Financial Resource Strain: Low Risk  (12/01/2019)   Overall Financial Resource Strain (CARDIA)    Difficulty of Paying Living Expenses: Not very hard  Food Insecurity: Not on file  Transportation Needs: Not on file  Physical Activity: Not on file  Stress: Not on file  Social Connections: Not on file  Intimate Partner Violence: Not on file     Review of Systems  All other systems reviewed and are negative.      Objective:   Physical Exam Vitals reviewed.  Constitutional:      General: He is not in acute distress.    Appearance: Normal  appearance. He is well-developed. He is not ill-appearing, toxic-appearing or diaphoretic.  HENT:     Head: Normocephalic and atraumatic.     Right Ear: Tympanic membrane, ear canal and external ear normal.     Left Ear: Tympanic membrane, ear canal and external ear normal.     Nose: Nose normal.     Mouth/Throat:     Pharynx: No oropharyngeal exudate.  Eyes:     General: No scleral icterus.       Right eye: No discharge.        Left eye: No discharge.     Conjunctiva/sclera: Conjunctivae normal.     Pupils: Pupils are equal, round, and reactive to light.  Neck:     Thyroid: No thyromegaly.     Vascular: No carotid bruit or JVD.     Trachea: No tracheal deviation.  Cardiovascular:     Rate and Rhythm: Bradycardia present. Rhythm irregular.     Heart sounds: Normal heart sounds. No murmur heard.    No friction rub. No gallop.  Pulmonary:     Effort: Pulmonary effort is normal. No respiratory distress.     Breath sounds: Normal breath sounds. No stridor. No wheezing, rhonchi or rales.  Chest:     Chest wall: No tenderness.  Abdominal:     General: Bowel sounds are normal. There is no distension.     Palpations: Abdomen is soft. There is no mass.     Tenderness: There is no abdominal tenderness. There is no guarding or rebound.     Hernia: A hernia is present.  Musculoskeletal:        General: No tenderness or deformity.     Cervical back: Normal range of motion and neck supple. No rigidity or tenderness.  Lymphadenopathy:     Cervical: No cervical adenopathy.  Skin:    General: Skin is warm.     Coloration: Skin is not jaundiced or pale.     Findings: No bruising, erythema, lesion or rash.  Neurological:     General: No focal deficit present.     Mental Status: He is alert and oriented to person, place, and time. Mental status is at baseline.     Cranial Nerves: No cranial nerve deficit.     Sensory: No sensory deficit.     Motor: No  weakness or abnormal muscle tone.      Coordination: Coordination normal.     Deep Tendon Reflexes: Reflexes are normal and symmetric.  Psychiatric:        Behavior: Behavior normal.        Thought Content: Thought content normal.        Judgment: Judgment normal.           Assessment & Plan:  General medical exam  Primary hypertension  Hypothyroidism, unspecified type  Prostate cancer screening  S/P CABG x 6 Physical exam today is completely normal.  Blood pressure is excellent.  I would like to see his goal LDL cholesterol less than 55 so we will add Zetia 10 mg daily to the Lipitor.  Also recommended decreasing his carbohydrate consumption.  Recommended getting an RSV vaccine.  We discussed the risk and benefits of the shingles vaccine but he declines this.  PSA The colonoscopy is today.

## 2022-06-07 ENCOUNTER — Other Ambulatory Visit: Payer: Self-pay | Admitting: Physician Assistant

## 2022-06-19 NOTE — Progress Notes (Unsigned)
Office Visit    Patient Name: Stuart Gentry Date of Encounter: 06/20/2022  PCP:  [name], MD   [place]  Cardiologist:  Jenkins Rouge, MD  Advanced Practice Provider:  No care team member to display Electrophysiologist:  None    HPI    Stuart Gentry is a 77 y.o. male with a past medical history with a past medical history of chronic kidney disease, GERD, hyperlipidemia, hypertension, hypothyroidism, sleep apnea presents today for follow-up appointment.  He retired from [employer].  Wife indicated that he was watching too much TV and was sedentary.  TTE done 08/10/2017 with EF 55 to 60%.  Trivial AR.  Normal estimated PA pressures, stress test positive and had cath with CM on/12/19.  Cardiac cath showed CTO mid LAD with severe three-vessel CAD referred for CABG.  Underwent CABG with Dr. Cyndia Bent 05/04/17 with LIMA to LAD, SVG sequential D1/D2 and sequential graft to OM1/OM 3 and SVG to PDA.  Postop course was complicated by PAF converted with amiodarone and no anticoagulation needed.  DC with amiodarone 200 twice daily which she is now off.  He was last seen 06/19/2021.  Tends to be bradycardic.  On Lopressor 25 mg twice daily.  LDL 72 on lab result review.  Today, he states that he feels okay.  He does have a random sharp pain that occurs on the right left side of his chest.  No exertional chest pain.  No shortness of breath and less he is heavily exerting himself.  No issues with medications.  No palpitations or lightheadedness/dizziness.  No syncope or presyncope.  He took his medications this morning and blood pressure slightly elevated.  On retake 146/80.  Asked to monitor at home and if remains elevated will need a medication change.  Zetia 10 mg was added by his primary for LDL of 73.  Follow-up labs are scheduled.   Reports no shortness of breath nor dyspnea on exertion. Reports no chest pain, pressure, or tightness. No edema, orthopnea, PND. Reports  no palpitations.  Past Medical History    Past Medical History:  Diagnosis Date   Chronic kidney disease 1965   kidney stones   Coronary artery calcification seen on CAT scan    GERD (gastroesophageal reflux disease)    Hypercholesteremia    Hypertension    Hypothyroidism    Euthyrox   Myocardial infarction [facility])    Sleep apnea    never been tested   Ulcerative colitis    Past Surgical History:  Procedure Laterality Date   COLONOSCOPY  2020   has had several due to UC   CORONARY ARTERY BYPASS GRAFT N/A 08/12/2017   Procedure: CORONARY ARTERY BYPASS GRAFTING (CABG) x six , using left internal mammory artery and right leg greater saphenous vein harvested endoscopically.;  Surgeon: Gaye Pollack, MD;  Location: [facility];  Service: Open Heart Surgery;  Laterality: N/A;   LEFT HEART CATH AND CORONARY ANGIOGRAPHY N/A 08/09/2017   Procedure: LEFT HEART CATH AND CORONARY ANGIOGRAPHY;  Surgeon: Burnell Blanks, MD;  Location: [facility];  Service: Cardiovascular;  Laterality: N/A;   SKIN TAG REMOVAL Right 2018   underarm   TEE WITHOUT CARDIOVERSION N/A 08/12/2017   Procedure: TRANSESOPHAGEAL ECHOCARDIOGRAM (TEE);  Surgeon: Gaye Pollack, MD;  Location: [facility];  Service: Open Heart Surgery;  Laterality: N/A;    Allergies  No Known Allergies   EKGs/Labs/Other Studies Reviewed:   The following studies were  reviewed today:  Echo 08/12/17 Septum: No Patent Foramen Ovale present.   Left atrium: Patent foramen ovale not present.   Aortic valve: The valve is trileaflet. No stenosis. Trace regurgitation.   Mitral valve: Trace regurgitation.   Right ventricle: Normal cavity size, wall thickness and ejection  fraction.   Pulmonic valve: Trace regurgitation.   EKG:  EKG is  ordered today.  The ekg ordered today demonstrates normal sinus rhythm, rate 62 bpm, bifascicular block and RBBB (stable from previous EKG)  Recent Labs: 05/23/2022: ALT 23; BUN 17; Creat 1.02;  Hemoglobin 13.2; Platelets 211; Potassium 4.7; Sodium 140  Recent Lipid Panel    Component Value Date/Time   CHOL 133 05/23/2022 0807   CHOL 124 01/14/2018 0836   TRIG 100 05/23/2022 0807   HDL 41 05/23/2022 0807   HDL 51 01/14/2018 0836   CHOLHDL 3.2 05/23/2022 0807   VLDL 20 11/07/2016 0816   LDLCALC 73 05/23/2022 0807      Home Medications   Current Meds  Medication Sig   acetaminophen (TYLENOL) 325 MG tablet Take 2 tablets (650 mg total) by mouth every 6 (six) hours as needed for mild pain.   aspirin EC 81 MG tablet Take 1 tablet (81 mg total) by mouth daily.   atorvastatin (LIPITOR) 80 MG tablet TAKE 1 TABLET BY MOUTH ONCE DAILY AT  6PM   benazepril (LOTENSIN) 40 MG tablet TAKE 1 TABLET BY MOUTH ONCE DAILY (APPOINTMENT  REQUIRED  FOR  FUTURE  REFILLS)   esomeprazole (NEXIUM) 20 MG capsule Take 20 mg by mouth every morning.    ezetimibe (ZETIA) 10 MG tablet Take 1 tablet (10 mg total) by mouth daily.   levothyroxine (SYNTHROID) 100 MCG tablet Take 1 tablet by mouth once daily   mesalamine (LIALDA) 1.2 g EC tablet TAKE 2 TABLETS BY MOUTH ONCE DAILY IN THE MORNING WITH BREAKFAST   mesalamine (ROWASA) 4 g enema PLACE 60 MLS (4 G TOTAL) RECTALLY AT BEDTIME.   Multiple Vitamin (MULTIVITAMIN) tablet Take 1 tablet by mouth daily.   nitroGLYCERIN (NITROSTAT) 0.4 MG SL tablet Place 1 tablet (0.4 mg total) under the tongue every 5 (five) minutes as needed for chest pain.   [DISCONTINUED] metoprolol tartrate (LOPRESSOR) 25 MG tablet Take 0.5 tablets (12.5 mg total) by mouth 2 (two) times daily.     Review of Systems      All other systems reviewed and are otherwise negative except as noted above.  Physical Exam    VS:  BP (!) 146/80   Pulse 62   Ht 5' 9.5" (1.765 m)   Wt 220 lb (99.8 kg)   SpO2 97%   BMI 32.02 kg/m  , BMI Body mass index is 32.02 kg/m.  Wt Readings from Last 3 Encounters:  06/20/22 220 lb (99.8 kg)  05/28/22 219 lb (99.3 kg)  10/24/21 204 lb 6 oz (92.7 kg)      GEN: Well nourished, well developed, in no acute distress. HEENT: normal. Neck: Supple, no JVD, carotid bruits, or masses. Cardiac: RRR, no murmurs, rubs, or gallops. No clubbing, cyanosis, edema.  Radials/PT 2+ and equal bilaterally.  Respiratory:  Respirations regular and unlabored, clear to auscultation bilaterally. GI: Soft, nontender, nondistended. MS: No deformity or atrophy. Skin: Warm and dry, no rash. Neuro:  Strength and sensation are intact. Psych: Normal affect.  Assessment & Plan    CAD status post CABG 07/2017 -lipid panel 3 months, PCP added zetia 81m  -continue current medication including: Aspirin 81 mg  daily, Lipitor 80 mg daily, Zetia 10 mg daily, Lopressor 12 and half milligrams twice daily, nitro as needed -No recent chest pains or shortness of breath  Dyspnea -only with heavy exertion, stable  Hyperlipidemia -Most recent LDL 73, goal less than 70 -PCP added Zetia 10 mg daily and plan for repeat lipid panel in 3 months  PAF -no blood thinners at the moment -asymptomatic  -Remains in normal sinus rhythm today, off amiodarone  Thyroid -synthroid 100 mcg -Most recent TSH from January 2023 and was 6.48, recommend titration of medication per primary  Bradycardia -Normal sinus rhythm, rate 62 -Remains on low-dose metoprolol 12.5 mg twice daily and tolerating well -EKG stable today  7. Hypertension -elevated today -Repeat 146/80 -Continue benazepril 40 mg daily and Lopressor 12.5 mg twice daily -Encouraged to track at home and if remains elevated will need a medication titration.   HYPERTENSION CONTROL Vitals:   06/20/22 1138 06/20/22 1212  BP: (!) 160/94 (!) 146/80    The patient's blood pressure is elevated above target today.  In order to address the patient's elevated BP: Blood pressure will be monitored at home to determine if medication changes need to be made.         Disposition: Follow up 1 year with Jenkins Rouge, MD or  APP.  Signed, Elgie Collard, PA-C 06/20/2022, 12:22 PM [facility]

## 2022-06-20 ENCOUNTER — Encounter: Payer: Self-pay | Admitting: Physician Assistant

## 2022-06-20 ENCOUNTER — Ambulatory Visit: Payer: Medicare Other | Attending: Physician Assistant | Admitting: Physician Assistant

## 2022-06-20 VITALS — BP 146/80 | HR 62 | Ht 69.5 in | Wt 220.0 lb

## 2022-06-20 DIAGNOSIS — Z951 Presence of aortocoronary bypass graft: Secondary | ICD-10-CM | POA: Diagnosis not present

## 2022-06-20 DIAGNOSIS — I480 Paroxysmal atrial fibrillation: Secondary | ICD-10-CM

## 2022-06-20 DIAGNOSIS — E782 Mixed hyperlipidemia: Secondary | ICD-10-CM | POA: Diagnosis not present

## 2022-06-20 DIAGNOSIS — K5120 Ulcerative (chronic) proctitis without complications: Secondary | ICD-10-CM

## 2022-06-20 DIAGNOSIS — I10 Essential (primary) hypertension: Secondary | ICD-10-CM | POA: Diagnosis not present

## 2022-06-20 DIAGNOSIS — I2510 Atherosclerotic heart disease of native coronary artery without angina pectoris: Secondary | ICD-10-CM

## 2022-06-20 MED ORDER — METOPROLOL TARTRATE 25 MG PO TABS: 12.5000 mg | ORAL_TABLET | Freq: Two times a day (BID) | ORAL | 3 refills | Status: DC

## 2022-06-20 NOTE — Patient Instructions (Signed)
Medication Instructions:  Your physician recommends that you continue on your current medications as directed. Please refer to the Current Medication list given to you today.  *If you need a refill on your cardiac medications before your next appointment, please call your pharmacy*   Lab Work: Fasting lipid panel with primary care in 3 months If you have labs (blood work) drawn today and your tests are completely normal, you will receive your results only by: [place] (if you have MyChart) OR A paper copy in the mail If you have any lab test that is abnormal or we need to change your treatment, we will call you to review the results.   Follow-Up: At [facility], you and your health needs are our priority.  As part of our continuing mission to provide you with exceptional heart care, we have created designated Provider Care Teams.  These Care Teams include your primary Cardiologist (physician) and Advanced Practice Providers (APPs -  Physician Assistants and Nurse Practitioners) who all work together to provide you with the care you need, when you need it.   Your next appointment:   1 year(s)  Provider:   Jenkins Rouge, MD

## 2022-06-27 ENCOUNTER — Other Ambulatory Visit: Payer: Self-pay | Admitting: Cardiovascular Disease

## 2022-07-13 ENCOUNTER — Other Ambulatory Visit: Payer: Self-pay | Admitting: Physician Assistant

## 2022-07-23 ENCOUNTER — Other Ambulatory Visit: Payer: Self-pay | Admitting: Family Medicine

## 2022-08-05 ENCOUNTER — Emergency Department: Payer: Medicare Other

## 2022-08-05 ENCOUNTER — Other Ambulatory Visit: Payer: Self-pay

## 2022-08-05 ENCOUNTER — Emergency Department
Admission: EM | Admit: 2022-08-05 | Discharge: 2022-08-05 | Disposition: A | Discharge status: Home / Self Care | Payer: Medicare Other | Attending: Emergency Medicine | Admitting: Emergency Medicine

## 2022-08-05 DIAGNOSIS — Z23 Encounter for immunization: Secondary | ICD-10-CM | POA: Insufficient documentation

## 2022-08-05 DIAGNOSIS — S61111A Laceration without foreign body of right thumb with damage to nail, initial encounter: Secondary | ICD-10-CM | POA: Diagnosis present

## 2022-08-05 DIAGNOSIS — W312XXA Contact with powered woodworking and forming machines, initial encounter: Secondary | ICD-10-CM | POA: Insufficient documentation

## 2022-08-05 DIAGNOSIS — Z7982 Long term (current) use of aspirin: Secondary | ICD-10-CM | POA: Insufficient documentation

## 2022-08-05 DIAGNOSIS — I2510 Atherosclerotic heart disease of native coronary artery without angina pectoris: Secondary | ICD-10-CM | POA: Insufficient documentation

## 2022-08-05 DIAGNOSIS — I10 Essential (primary) hypertension: Secondary | ICD-10-CM | POA: Insufficient documentation

## 2022-08-05 DIAGNOSIS — Z951 Presence of aortocoronary bypass graft: Secondary | ICD-10-CM | POA: Insufficient documentation

## 2022-08-05 DIAGNOSIS — E039 Hypothyroidism, unspecified: Secondary | ICD-10-CM | POA: Insufficient documentation

## 2022-08-05 DIAGNOSIS — Y92009 Unspecified place in unspecified non-institutional (private) residence as the place of occurrence of the external cause: Secondary | ICD-10-CM | POA: Insufficient documentation

## 2022-08-05 MED ORDER — LIDOCAINE-EPINEPHRINE-TETRACAINE (LET) TOPICAL GEL
3.0000 mL | Freq: Once | TOPICAL | Status: AC
  Administered 2022-08-05: 3 mL via TOPICAL
  Filled 2022-08-05: qty 3

## 2022-08-05 MED ORDER — TETANUS-DIPHTH-ACELL PERTUSSIS 5-2.5-18.5 LF-MCG/0.5 IM SUSY
0.5000 mL | PREFILLED_SYRINGE | Freq: Once | INTRAMUSCULAR | Status: AC
  Administered 2022-08-05: 0.5 mL via INTRAMUSCULAR
  Filled 2022-08-05: qty 0.5

## 2022-08-05 MED ORDER — LIDOCAINE-EPINEPHRINE 2 %-1:100000 IJ SOLN
20.0000 mL | Freq: Once | INTRAMUSCULAR | Status: AC
  Administered 2022-08-05: 20 mL
  Filled 2022-08-05: qty 1

## 2022-08-05 NOTE — Discharge Instructions (Signed)
You were evaluated in the emergency department for a laceration. It was repaired with sutures. Keep the area clean and dry.  Wash multiple times per day with soap and water.  Do not go into the ocean or swimming pool.  Return to the emergency department or your primary care physician's office in 7 days for suture removal.  Return to the emergency department for:  -- Fever > 100.4F -- Increase pain in the wound -- Increase redness and swelling -- Pus coming from the wound -- Wound bleeds more than a small amount or it does not stop -- Wound edges come apart -- Severe pain -- Weakness or numbness in the affected area  Or any other new or worsening symptoms. It was a pleasure caring for you.  

## 2022-08-05 NOTE — ED Triage Notes (Signed)
Pt presents to ED from home after laceration to R thumb. Pt reports using table saw and his hand slipped. Bleeding controlled in triage. Unsure of last tetanus.

## 2022-08-05 NOTE — ED Provider Notes (Signed)
[facility] Provider Note    Event Date/Time   First MD Initiated Contact with Patient 08/05/22 1545     (approximate)   History   Laceration and Finger Injury   HPI  Stuart Gentry is a 77 y.o. male who presents today for evaluation of right thumb injury.  Patient reports that he was working on a well at home and accidentally sliced his thumb with a table saw.  He denies numbness or tingling.  He is still able to move his thumb normally.  He takes 81 mg of aspirin daily.  He is unsure of his last tetanus shot.  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   CAD (coronary artery disease), native coronary artery 08/09/2017   CAD (coronary artery disease) 08/09/2017   Coronary artery disease involving native coronary artery of native heart with unstable angina pectoris    Coronary artery calcification seen on CAT scan    Hypertension    Ulcerative colitis    Hypercholesteremia    Hypothyroidism           Physical Exam   Triage Vital Signs: ED Triage Vitals [08/05/22 1457]  Enc Vitals Group     BP (!) 187/82     Pulse Rate (!) 47     Resp 16     Temp 98 F (36.7 C)     Temp Source Oral     SpO2 99 %     Weight 210 lb (95.3 kg)     Height 5\' 10"  (1.778 m)     Head Circumference      Peak Flow      Pain Score 4     Pain Loc      Pain Edu?      Excl. in GC?     Most recent vital signs: Vitals:   08/05/22 1457 08/05/22 1720  BP: (!) 187/82 (!) 150/82  Pulse: (!) 47 (!) 54  Resp: 16 17  Temp: 98 F (36.7 C) 98.1 F (36.7 C)  SpO2: 99% 99%    Physical Exam Vitals and nursing note reviewed.  Constitutional:      General: Awake and alert. No acute distress.    Appearance: Normal appearance. The patient is normal weight.  HENT:     Head: Normocephalic and atraumatic.     Mouth: Mucous membranes are moist.  Eyes:     General: PERRL. Normal EOMs        Right eye: No discharge.        Left  eye: No discharge.     Conjunctiva/sclera: Conjunctivae normal.  Cardiovascular:     Rate and Rhythm: Normal rate and regular rhythm.     Pulses: Normal pulses.  Pulmonary:     Effort: Pulmonary effort is normal. No respiratory distress.     Breath sounds: Normal breath sounds.  Abdominal:     Abdomen is soft. There is no abdominal tenderness. No rebound or guarding. No distention. Musculoskeletal:        General: No swelling. Normal range of motion.     Cervical back: Normal range of motion and neck supple.  Right thumb: Star-shaped laceration to the tip of thumb with 2 skin flaps noted.  There is loss of very distal portion of thumbnail, the rest of thumb still firmly embedded and nailbed intact.  Patient is able to abduct and adduct thumb fully.  Able to flex and extend  at isolated MCP and PIP against resistance.  Normal capillary refill.  Sensation intact light touch throughout finger. Skin:    General: Skin is warm and dry.     Capillary Refill: Capillary refill takes less than 2 seconds.     Findings: No rash.  Neurological:     Mental Status: The patient is awake and alert.      ED Results / Procedures / Treatments   Labs (all labs ordered are listed, but only abnormal results are displayed) Labs Reviewed - No data to display   EKG     RADIOLOGY I independently reviewed and interpreted imaging and agree with radiologists findings.     PROCEDURES:  Critical Care performed:   Marland KitchenMarland KitchenLaceration Repair  Date/Time: 08/05/2022 5:20 PM  Performed by: Jackelyn Hoehn, PA-C Authorized by: Jackelyn Hoehn, PA-C   Consent:    Consent obtained:  Verbal   Consent given by:  Patient   Risks, benefits, and alternatives were discussed: yes     Risks discussed:  Need for additional repair, nerve damage, infection, pain, poor cosmetic result, poor wound healing, vascular damage, tendon damage and retained foreign body   Alternatives discussed:  No treatment Universal protocol:     Procedure explained and questions answered to patient or proxy's satisfaction: yes     Relevant documents present and verified: yes     Test results available: yes     Imaging studies available: yes     Required blood products, implants, devices, and special equipment available: yes     Site/side marked: yes     Immediately prior to procedure, a time out was called: yes     Patient identity confirmed:  Verbally with patient Anesthesia:    Anesthesia method:  Topical application   Topical anesthetic:  LET Laceration details:    Location:  Finger   Finger location:  R thumb   Length (cm):  2.6   Depth (mm):  2 Pre-procedure details:    Preparation:  Patient was prepped and draped in usual sterile fashion and imaging obtained to evaluate for foreign bodies Exploration:    Limited defect created (wound extended): no     Hemostasis achieved with:  Direct pressure and epinephrine   Imaging obtained: x-ray     Wound exploration: wound explored through full range of motion and entire depth of wound visualized     Wound extent: areolar tissue not violated, fascia not violated, no foreign body, no signs of injury, no nerve damage, no tendon damage, no underlying fracture and no vascular damage     Contaminated: no   Treatment:    Area cleansed with:  Povidone-iodine and saline   Amount of cleaning:  Standard   Irrigation solution:  Tap water and sterile saline   Irrigation method:  Pressure wash   Debridement:  None   Undermining:  None   Scar revision: no   Skin repair:    Repair method:  Sutures   Suture size:  5-0   Suture material:  Nylon   Suture technique:  Simple interrupted   Number of sutures:  3 Approximation:    Approximation:  Loose Repair type:    Repair type:  Intermediate Post-procedure details:    Dressing:  Non-adherent dressing, sterile dressing and tube gauze   Procedure completion:  Tolerated well, no immediate complications    MEDICATIONS ORDERED IN  ED: Medications  lidocaine-EPINEPHrine-tetracaine (LET) topical gel (3 mLs Topical Given 08/05/22 1608)  Tdap (BOOSTRIX) injection 0.5  mL (0.5 mLs Intramuscular Given 08/05/22 1605)  lidocaine-EPINEPHrine (XYLOCAINE W/EPI) 2 %-1:100000 (with pres) injection 20 mL (20 mLs Infiltration Given by Other 08/05/22 1615)     IMPRESSION / MDM / ASSESSMENT AND PLAN / ED COURSE  I reviewed the triage vital signs and the nursing notes.   Differential diagnosis includes, but is not limited to, avulsion injury, laceration, partial amputation, nail injury, tendon injury.  Patient is neurovascularly intact.  Sensation intact to light touch throughout finger, do not suspect nerve injury.  Able to flex and extend at isolated DIP and MCP.  Normal abduction and adduction of thumb against resistance, do not suspect tendon injury.  He has sliced the tip of his fingernail off, however there is no nailbed involvement.  X-rays negative for osseous injury.  Wound fully probed and evaluated, no retained foreign body.  Wound was anesthetized and irrigated, skin flaps were tacked down with sutures.  Xeroform dressing applied.  We discussed suture care and timeline for removal.  Tdap was updated.  Recommended close outpatient follow-up and return precautions.  Patient understands and agrees with plan.  He was discharged in stable condition.   Patient's presentation is most consistent with acute complicated illness / injury requiring diagnostic workup.     FINAL CLINICAL IMPRESSION(S) / ED DIAGNOSES   Final diagnoses:  Laceration of right thumb without foreign body with damage to nail, initial encounter     Rx / DC Orders   ED Discharge Orders     None        Note:  This document was prepared using Dragon voice recognition software and may include unintentional dictation errors.   Jackelyn Hoehnoggi, Alazne Quant E, PA-C 08/05/22 1722    Merwyn KatosBradler, Evan K, MD 08/05/22 [phone]

## 2022-08-07 ENCOUNTER — Other Ambulatory Visit: Payer: Medicare Other

## 2022-08-07 ENCOUNTER — Telehealth: Payer: Self-pay

## 2022-08-07 ENCOUNTER — Ambulatory Visit: Payer: Self-pay

## 2022-08-07 DIAGNOSIS — I25110 Atherosclerotic heart disease of native coronary artery with unstable angina pectoris: Secondary | ICD-10-CM

## 2022-08-07 DIAGNOSIS — I2510 Atherosclerotic heart disease of native coronary artery without angina pectoris: Secondary | ICD-10-CM

## 2022-08-07 DIAGNOSIS — E7800 Pure hypercholesterolemia, unspecified: Secondary | ICD-10-CM

## 2022-08-07 NOTE — Transitions of Care (Post Inpatient/ED Visit) (Signed)
   08/07/2022  Name: Rashard Shutt MRN: [id number] DOB: Feb 05, 1946  Today's TOC FU Call Status: Today's TOC FU Call Status:: Unsuccessul Call (1st Attempt) Unsuccessful Call (1st Attempt) Date: 08/07/22  Attempted to reach the patient regarding the most recent Inpatient/ED visit.  Follow Up Plan: Additional outreach attempts will be made to reach the patient to complete the Transitions of Care (Post Inpatient/ED visit) call.   Agnes Lawrence, CMA (AAMA)  [facility] [phone]

## 2022-08-07 NOTE — Chronic Care Management (AMB) (Signed)
   08/07/2022  Cecille Po [birth year] [phone]   Reason for Encounter: Patient is not currently enrolled in the CCM program. CCM status changed to previously enrolled.   Katha Cabal RN Care Manager/Chronic Care Management [phone]

## 2022-08-08 ENCOUNTER — Other Ambulatory Visit: Payer: Self-pay | Admitting: Physician Assistant

## 2022-08-08 LAB — LIPID PANEL
Cholesterol: 100 mg/dL (ref <200)
HDL: 35 mg/dL — ABNORMAL LOW (ref >=40)
LDL Cholesterol (Calc): 48 mg/dL (calc)
Non-HDL Cholesterol (Calc): 65 mg/dL (calc) (ref <130)
Total CHOL/HDL Ratio: 2.9 (calc) (ref <5.0)
Triglycerides: 89 mg/dL (ref <150)

## 2022-08-09 ENCOUNTER — Other Ambulatory Visit: Payer: Self-pay | Admitting: Family Medicine

## 2022-08-10 ENCOUNTER — Ambulatory Visit: Payer: Medicare Other | Admitting: Family Medicine

## 2022-08-10 ENCOUNTER — Encounter: Payer: Self-pay | Admitting: Family Medicine

## 2022-08-10 VITALS — BP 146/78 | HR 68 | Temp 97.7°F | Ht 70.0 in | Wt 218.0 lb

## 2022-08-10 DIAGNOSIS — E7800 Pure hypercholesterolemia, unspecified: Secondary | ICD-10-CM | POA: Diagnosis not present

## 2022-08-10 DIAGNOSIS — I10 Essential (primary) hypertension: Secondary | ICD-10-CM | POA: Diagnosis not present

## 2022-08-10 DIAGNOSIS — Z951 Presence of aortocoronary bypass graft: Secondary | ICD-10-CM

## 2022-08-10 MED ORDER — AMLODIPINE BESYLATE 5 MG PO TABS
5.0000 mg | ORAL_TABLET | Freq: Every day | ORAL | 3 refills | Status: AC
Start: 2022-08-10 — End: ?

## 2022-08-10 NOTE — Progress Notes (Signed)
Subjective:    Patient ID: Stuart Gentry, male    DOB: 1945/07/17, 77 y.o.   MRN: [id number]  HPI Patient is here for follow-up. PMH is significant for severe 3 vessel disease s/p CABG with Dr Laneta Simmers on 08/12/17.  Post op course complicated by PAF converted with amiodarone and no anticoagulation needed.   When I saw the patient in January, his LDL cholesterol was elevated.  I recommended trying to achieve an LDL cholesterol less than 55 given his past medical history.  He is currently on a statin and Zetia.  He is tolerating the medications well.  His LDL cholesterol recently found to be 48.  He denies any right upper quadrant pain.  However his blood pressure remains high.  His blood pressure at the cardiologist office was greater than 140 in February.  Is greater than 140 today as well. Lab on 08/07/2022  Component Date Value Ref Range Status   Cholesterol 08/07/2022 100  <200 mg/dL Final   HDL 15/72/6203 35 (L)  > OR = 40 mg/dL Final   Triglycerides 55/97/4163 89  <150 mg/dL Final   LDL Cholesterol (Calc) 08/07/2022 48  mg/dL (calc) Final   Comment: Reference range: <100 . Desirable range <100 mg/dL for primary prevention;   <70 mg/dL for patients with CHD or diabetic patients  with > or = 2 CHD risk factors. Marland Kitchen LDL-C is now calculated using the Martin-Hopkins  calculation, which is a validated novel method providing  better accuracy than the Friedewald equation in the  estimation of LDL-C.  Horald Pollen et al. Lenox Ahr. 8453;646(80): 2061-2068  (http://education.[facility].com/faq/FAQ164)    Total CHOL/HDL Ratio 08/07/2022 2.9  <3.2 (calc) Final   Non-HDL Cholesterol (Calc) 08/07/2022 65  <130 mg/dL (calc) Final   Comment: For patients with diabetes plus 1 major ASCVD risk  factor, treating to a non-HDL-C goal of <100 mg/dL  (LDL-C of <12 mg/dL) is considered a therapeutic  option.    Immunization History  Administered Date(s) Administered   Fluad Quad(high Dose 65+)  01/26/2020   Influenza Whole 01/29/2007   Influenza, High Dose Seasonal PF 02/15/2016, 03/14/2017, 04/04/2018, 01/28/2019, 03/13/2021   Influenza,inj,Quad PF,6+ Mos 03/21/2015   Influenza-Unspecified 01/14/2022   PFIZER Comirnaty(Gray Top)Covid-19 Tri-Sucrose Vaccine 09/21/2020   PFIZER(Purple Top)SARS-COV-2 Vaccination 05/20/2019, 06/10/2019, 01/21/2020   Pfizer Covid-19 Vaccine Bivalent Booster 47yrs & up 03/13/2021   Pneumococcal Conjugate-13 07/07/2013   Pneumococcal Polysaccharide-23 11/29/2007, 03/21/2015   Td 04/30/2005   Tdap 08/05/2022   Unspecified SARS-COV-2 Vaccination 01/14/2022    Past Medical History:  Diagnosis Date   Chronic kidney disease 1965   kidney stones   Coronary artery calcification seen on CAT scan    GERD (gastroesophageal reflux disease)    Hypercholesteremia    Hypertension    Hypothyroidism    Euthyrox   Myocardial infarction    Sleep apnea    never been tested   Ulcerative colitis    Past Surgical History:  Procedure Laterality Date   COLONOSCOPY  2020   has had several due to UC   CORONARY ARTERY BYPASS GRAFT N/A 08/12/2017   Procedure: CORONARY ARTERY BYPASS GRAFTING (CABG) x six , using left internal mammory artery and right leg greater saphenous vein harvested endoscopically.;  Surgeon: Alleen Borne, MD;  Location: [facility] OR;  Service: Open Heart Surgery;  Laterality: N/A;   LEFT HEART CATH AND CORONARY ANGIOGRAPHY N/A 08/09/2017   Procedure: LEFT HEART CATH AND CORONARY ANGIOGRAPHY;  Surgeon: Kathleene Hazel, MD;  Location: [facility] INVASIVE CV LAB;  Service: Cardiovascular;  Laterality: N/A;   SKIN TAG REMOVAL Right 2018   underarm   TEE WITHOUT CARDIOVERSION N/A 08/12/2017   Procedure: TRANSESOPHAGEAL ECHOCARDIOGRAM (TEE);  Surgeon: Alleen Borne, MD;  Location: [facility] OR;  Service: Open Heart Surgery;  Laterality: N/A;   Current Outpatient Medications on File Prior to Visit  Medication Sig Dispense Refill   acetaminophen (TYLENOL)  325 MG tablet Take 2 tablets (650 mg total) by mouth every 6 (six) hours as needed for mild pain.     aspirin EC 81 MG tablet Take 1 tablet (81 mg total) by mouth daily.     atorvastatin (LIPITOR) 80 MG tablet TAKE 1 TABLET BY MOUTH ONCE DAILY AT  6PM 90 tablet 3   benazepril (LOTENSIN) 40 MG tablet Take 1 tablet (40 mg total) by mouth daily. 90 tablet 0   esomeprazole (NEXIUM) 20 MG capsule Take 20 mg by mouth every morning.      ezetimibe (ZETIA) 10 MG tablet Take 1 tablet (10 mg total) by mouth daily. 90 tablet 3   levothyroxine (SYNTHROID) 100 MCG tablet Take 1 tablet by mouth once daily 90 tablet 0   mesalamine (LIALDA) 1.2 g EC tablet TAKE 2 TABLETS BY MOUTH ONCE DAILY IN THE MORNING WITH BREAKFAST 60 tablet 0   mesalamine (ROWASA) 4 g enema PLACE 60 MLS (4 G TOTAL) RECTALLY AT BEDTIME. 5400 mL 1   metoprolol tartrate (LOPRESSOR) 25 MG tablet Take 0.5 tablets (12.5 mg total) by mouth 2 (two) times daily. 90 tablet 3   Multiple Vitamin (MULTIVITAMIN) tablet Take 1 tablet by mouth daily.     nitroGLYCERIN (NITROSTAT) 0.4 MG SL tablet Place 1 tablet (0.4 mg total) under the tongue every 5 (five) minutes as needed for chest pain. 25 tablet 1   No current facility-administered medications on file prior to visit.   No Known Allergies Social History   Socioeconomic History   Marital status: Married    Spouse name: Not on file   Number of children: 3   Years of education: Not on file   Highest education level: 12th grade  Occupational History   Occupation: Retired  Tobacco Use   Smoking status: Never   Smokeless tobacco: Never  Vaping Use   Vaping Use: Never used  Substance and Sexual Activity   Alcohol use: Yes    Comment: 1-2 cans beer monthly   Drug use: No    Comment: occaasional marijuana in 1960s   Sexual activity: Yes  Other Topics Concern   Not on file  Social History Narrative   Not on file   Social Determinants of Health   Financial Resource Strain: Low Risk   (08/06/2022)   Overall Financial Resource Strain (CARDIA)    Difficulty of Paying Living Expenses: Not hard at all  Food Insecurity: No Food Insecurity (08/06/2022)   Hunger Vital Sign    Worried About Running Out of Food in the Last Year: Never true    Ran Out of Food in the Last Year: Never true  Transportation Needs: No Transportation Needs (08/06/2022)   PRAPARE - Administrator, Civil Service (Medical): No    Lack of Transportation (Non-Medical): No  Physical Activity: Insufficiently Active (08/06/2022)   Exercise Vital Sign    Days of Exercise per Week: 1 day    Minutes of Exercise per Session: 20 min  Stress: No Stress Concern Present (08/06/2022)   [employer] of Occupational Health -  Occupational Stress Questionnaire    Feeling of Stress : Not at all  Social Connections: Moderately Isolated (08/06/2022)   Social Connection and Isolation Panel [NHANES]    Frequency of Communication with Friends and Family: More than three times a week    Frequency of Social Gatherings with Friends and Family: Twice a week    Attends Religious Services: Never    Database administrator or Organizations: No    Attends Engineer, structural: Not on file    Marital Status: Married  Catering manager Violence: Not on file     Review of Systems  All other systems reviewed and are negative.      Objective:   Physical Exam Vitals reviewed.  Constitutional:      General: He is not in acute distress.    Appearance: Normal appearance. He is well-developed. He is not ill-appearing, toxic-appearing or diaphoretic.  HENT:     Head: Normocephalic and atraumatic.     Right Ear: Tympanic membrane, ear canal and external ear normal.     Left Ear: Tympanic membrane, ear canal and external ear normal.     Nose: Nose normal.     Mouth/Throat:     Pharynx: No oropharyngeal exudate.  Eyes:     General: No scleral icterus.       Right eye: No discharge.        Left eye: No discharge.      Conjunctiva/sclera: Conjunctivae normal.     Pupils: Pupils are equal, round, and reactive to light.  Neck:     Thyroid: No thyromegaly.     Vascular: No carotid bruit or JVD.     Trachea: No tracheal deviation.  Cardiovascular:     Rate and Rhythm: Bradycardia present. Rhythm irregular.     Heart sounds: Normal heart sounds. No murmur heard.    No friction rub. No gallop.  Pulmonary:     Effort: Pulmonary effort is normal. No respiratory distress.     Breath sounds: Normal breath sounds. No stridor. No wheezing, rhonchi or rales.  Chest:     Chest wall: No tenderness.  Abdominal:     General: Bowel sounds are normal. There is no distension.     Palpations: Abdomen is soft. There is no mass.     Tenderness: There is no abdominal tenderness. There is no guarding or rebound.     Hernia: A hernia is present.  Musculoskeletal:        General: No tenderness or deformity.     Cervical back: Normal range of motion and neck supple. No rigidity or tenderness.  Lymphadenopathy:     Cervical: No cervical adenopathy.  Skin:    General: Skin is warm.     Coloration: Skin is not jaundiced or pale.     Findings: No bruising, erythema, lesion or rash.  Neurological:     General: No focal deficit present.     Mental Status: He is alert and oriented to person, place, and time. Mental status is at baseline.     Cranial Nerves: No cranial nerve deficit.     Sensory: No sensory deficit.     Motor: No weakness or abnormal muscle tone.     Coordination: Coordination normal.     Deep Tendon Reflexes: Reflexes are normal and symmetric.  Psychiatric:        Behavior: Behavior normal.        Thought Content: Thought content normal.  Judgment: Judgment normal.           Assessment & Plan:  Primary hypertension  S/P CABG x 6  Hypercholesteremia I am extremely happy with his cholesterol.  Add amlodipine 5 mg a day to his benazepril and metoprolol.  Recheck blood pressure in 2 to 3  weeks

## 2022-08-13 ENCOUNTER — Ambulatory Visit: Payer: Medicare Other

## 2022-08-13 VITALS — Ht 70.0 in | Wt 218.0 lb

## 2022-08-13 DIAGNOSIS — S61011A Laceration without foreign body of right thumb without damage to nail, initial encounter: Secondary | ICD-10-CM

## 2022-08-13 NOTE — Progress Notes (Signed)
Pt here for removal of stitches from R thumb. Three stitches visualized and removed with tweezers and suture removal scissors. Pt tolerated well. Steri-strip x 1 applied to middle of thumb due to small of amount of blood oozing from area. Pt advised to watch for infection signs and symptoms. Pt verbalized understanding of all. Mjp,lpn

## 2022-08-23 NOTE — Transitions of Care (Post Inpatient/ED Visit) (Signed)
   08/23/2022  Name: Stuart Gentry MRN: [id number] DOB: [birth year]  Today's TOC FU Call Status: Today's TOC FU Call Status:: Unsuccessul Call (1st Attempt) Unsuccessful Call (1st Attempt) Date: 08/07/22  Attempted to reach the patient regarding the most recent Inpatient/ED visit.  Follow Up Plan: Patient has been seen by PCP already.  Signature Agnes Lawrence, CMA (AAMA)  [facility] [phone]

## 2022-08-27 ENCOUNTER — Telehealth: Payer: Self-pay | Admitting: Cardiovascular Disease

## 2022-08-27 MED ORDER — METOPROLOL TARTRATE 25 MG PO TABS: 12.5000 mg | ORAL_TABLET | Freq: Two times a day (BID) | ORAL | 3 refills | Status: DC

## 2022-08-27 NOTE — Telephone Encounter (Signed)
*  STAT* If patient is at the pharmacy, call can be transferred to refill team.   1. Which medications need to be refilled? (please list name of each medication and dose if known) metoprolol tartrate (LOPRESSOR) 25 MG tablet   2. Which pharmacy/location (including street and city if local pharmacy) is medication to be sent to?  [id number] - [place], [place] - [address]    3. Do they need a 30 day or 90 day supply? 90  

## 2022-08-27 NOTE — Telephone Encounter (Signed)
Refill for Metoprolol has been sent to [facility].

## 2022-09-07 ENCOUNTER — Other Ambulatory Visit: Payer: Self-pay | Admitting: Physician Assistant

## 2022-09-12 ENCOUNTER — Telehealth: Payer: Self-pay | Admitting: Physician Assistant

## 2022-09-12 NOTE — Telephone Encounter (Signed)
Patient called stating he would like his prescription for mesalamine 1.2 g to be refilled through [facility] pharmacy for a 90 day supply. States it will help him save money this way. Please advise, thank you.

## 2022-09-13 MED ORDER — MESALAMINE 1.2 G PO TBEC: 2.4000 g | DELAYED_RELEASE_TABLET | Freq: Every day | ORAL | 0 refills | Status: DC

## 2022-09-13 NOTE — Telephone Encounter (Signed)
Refill sent to pharmacy. Patient needs office visit for additional refills.

## 2022-10-06 ENCOUNTER — Other Ambulatory Visit: Payer: Self-pay | Admitting: Physician Assistant

## 2022-10-26 ENCOUNTER — Other Ambulatory Visit: Payer: Self-pay | Admitting: Family Medicine

## 2022-10-29 NOTE — Telephone Encounter (Signed)
Requested Prescriptions  Pending Prescriptions Disp Refills   benazepril (LOTENSIN) 40 MG tablet [Pharmacy Med Name: Benazepril HCl 40 MG Oral Tablet] 90 tablet 0    Sig: Take 1 tablet by mouth once daily     Cardiovascular:  ACE Inhibitors Failed - 10/26/2022  2:55 PM      Failed - Last BP in normal range    BP Readings from Last 1 Encounters:  08/10/22 (!) 146/78         Failed - Valid encounter within last 6 months    Recent Outpatient Visits           1 year ago Prostate cancer screening   [facility] Donita Brooks, MD   2 years ago General medical exam   [facility] Donita Brooks, MD   3 years ago General medical exam   [facility] Donita Brooks, MD   5 years ago General medical exam   [facility] Donita Brooks, MD   5 years ago Pure hypercholesterolemia   [facility] Pickard, Priscille Heidelberg, MD              Passed - Cr in normal range and within 180 days    Creat  Date Value Ref Range Status  05/23/2022 1.02 0.70 - 1.28 mg/dL Final         Passed - K in normal range and within 180 days    Potassium  Date Value Ref Range Status  05/23/2022 4.7 3.5 - 5.3 mmol/L Final         Passed - Patient is not pregnant

## 2022-11-05 ENCOUNTER — Other Ambulatory Visit: Payer: Self-pay | Admitting: Family Medicine

## 2022-12-05 ENCOUNTER — Other Ambulatory Visit: Payer: Self-pay | Admitting: Physician Assistant

## 2022-12-15 ENCOUNTER — Other Ambulatory Visit: Payer: Self-pay | Admitting: Physician Assistant

## 2022-12-18 ENCOUNTER — Ambulatory Visit: Payer: Medicare Other | Admitting: Gastroenterology

## 2022-12-18 ENCOUNTER — Encounter: Payer: Self-pay | Admitting: Gastroenterology

## 2022-12-18 ENCOUNTER — Other Ambulatory Visit: Payer: Medicare Other

## 2022-12-18 VITALS — BP 132/72 | Ht 70.0 in | Wt 208.0 lb

## 2022-12-18 DIAGNOSIS — K5100 Ulcerative (chronic) pancolitis without complications: Secondary | ICD-10-CM

## 2022-12-18 DIAGNOSIS — K5190 Ulcerative colitis, unspecified, without complications: Secondary | ICD-10-CM

## 2022-12-18 MED ORDER — MESALAMINE 1.2 G PO TBEC: 2.4000 g | DELAYED_RELEASE_TABLET | Freq: Every day | ORAL | 3 refills | Status: DC

## 2022-12-18 NOTE — Patient Instructions (Addendum)
_______________________________________________________  If your blood pressure at your visit was 140/90 or greater, please contact your primary care physician to follow up on this.  We have sent the following medications to your pharmacy for you to pick up at your convenience: Lialda   Your provider has requested that you go to the basement level for lab work before leaving today. Press "B" on the elevator. The lab is located at the first door on the left as you exit the elevator.    If you are age 78 or older, your body mass index should be between 23-30. Your Body mass index is 29.84 kg/m. If this is out of the aforementioned range listed, please consider follow up with your Primary Care Provider.  If you are age 40 or younger, your body mass index should be between 19-25. Your Body mass index is 29.84 kg/m. If this is out of the aformentioned range listed, please consider follow up with your Primary Care Provider.   ________________________________________________________  The [place] providers would like to encourage you to use [facility] to communicate with providers for non-urgent requests or questions.  Due to long hold times on the telephone, sending your provider a message by [facility] may be a faster and more efficient way to get a response.  Please allow 48 business hours for a response.  Please remember that this is for non-urgent requests.   It was a pleasure to see you today!  Thank you for trusting me with your gastrointestinal care!    Scott E.Tomasa Rand, MD

## 2022-12-18 NOTE — Progress Notes (Signed)
HPI : Stuart Gentry is a 77 y.o. male with a history of longstanding ulcerative colitis and history of coronary artery disease status post CABG who presents to clinic for annual follow-up.  He was previously followed by Dr. Christella Hartigan.  He was last seen in our office October 24, 2021 at which time his sulfasalazine was switched to Lialda.  He had been having left-sided abdominal pain, thought to possibly represent flare of his colitis. Today, the patient states that he is doing well from a colitis standpoint.  He continues to take 2 tablets of Lialda daily.  He states that is working much better than sulfasalazine.  He denies any problems with abdominal pain, diarrhea, urgency, tenesmus or blood in the stool.  No nocturnal bowel movements. He continues to have irregular bowel movements, sometimes going a week without a bowel movement.  Sometimes he will have loose stools, but estimates this to represent maybe 15% of his stools. He does continue to take Rowasa enemas on an as needed basis, usually twice a month.  He takes this when he feels like he is having any colitis symptoms.  He has a history of coronary artery disease status post CABG in 2019.  He has not had any concerning symptoms or problems with his heart since his surgery.    Review of pertinent gastrointestinal problems:   1. Diagnosed with UC in mid 1980s in [place] bloody diarrhea; Moved to [facility], GI MD Dr. Jodi Marble cared for 1990s; was on prednisone early on in problem. Seems to flare 1-2 per year with urgency, mucous output maybe a bit of blood. Will take rowasa enemas for a month. Chronically on sulfasalazine. He has been getting q two year colonoscopies.Has believes that he has only had a very limited left sided disease. Multiple colonoscopies done over the past 20 years. I reviewed many of them from Dr. Jodi Marble; it is clear that he has had only macroscopic disease in the rectosigmoid colon up to about 25 cm however biopsies of his descending,  right colon have also shown changes of chronic inflammatory bowel disease several times. His last colonoscopy outside was January 2013. Colonoscopy 05/2013 Christella Hartigan terminal ileum was normal. There was clear, moderate inflammation from anus to about 60cm proximal (very indistinct transition to normal mucosa). The right colon was essentially normal appearing. Biopsies showed chronic and active inflammation in left colon; right colon was normal. 06/2012 changed to mesalamine full strength, nightly rowasa.  2015, continued intermittent flares, offered several options for medical management, decided to increase the sulfasalazine to 6 g daily, also mesalamine enemas. Colonoscopy Dr. Christella Hartigan 10/2016: Normal TI, mild inflammation to 30cm (path quiencent colitis), focal hepatic flexure inflammation (chronic moderately active colitis). Random colon biopsies without dysplasia.  Colonoscopy August 2020 Dr. Christella Hartigan the terminal ileum was normal, the distal colon was mildly inflamed overtly with discrete transition from abnormal to normal mucosa around 40 cm from the anus.  Extensive biopsies were taken.  Right colon biopsies showed "inactive colitis and colitis" and the left colon showed active mild inflammation.  He was recommended to have follow-up in 1 to 2 months after adjusting his medicines.  We have not heard from him since.  Colonoscopy December 2022 showed normal terminal ileum single 5 mm tubular adenoma was removed.  Right colon was normal, left colon was normal.  Rectosigmoid was erythematous with a non a discrete transition from normal to abnormal mucosa around 20 cm.  Biopsies from right and left colon were normal, biopsies  from rectosigmoid showed chronic inflammation.  I asked that he resume his mesalamine enemas on a daily basis rather than every 2 or 3 days. 2. Upper endoscopy 2013, small 'ulcers in stomach.' Ulcers had healed on nexium daily. Small submucosal lesion in EGD, eventually evaluated at [facility]. DR. Germain Osgood by  EUS, was told it was fatty lesion (lipoma) and it needed no further following. 3. Ischemic colitis (likely) 08/2016: acute abd pain, prominantly inflammed sigmoid segment on CT 08/2016    Past Medical History:  Diagnosis Date   Chronic kidney disease 1965   kidney stones   Coronary artery calcification seen on CAT scan    GERD (gastroesophageal reflux disease)    Hypercholesteremia    Hypertension    Hypothyroidism    Euthyrox   Myocardial infarction [facility])    Sleep apnea    never been tested   Ulcerative colitis      Past Surgical History:  Procedure Laterality Date   COLONOSCOPY  2020   has had several due to UC   CORONARY ARTERY BYPASS GRAFT N/A 08/12/2017   Procedure: CORONARY ARTERY BYPASS GRAFTING (CABG) x six , using left internal mammory artery and right leg greater saphenous vein harvested endoscopically.;  Surgeon: Alleen Borne, MD;  Location: [facility] OR;  Service: Open Heart Surgery;  Laterality: N/A;   LEFT HEART CATH AND CORONARY ANGIOGRAPHY N/A 08/09/2017   Procedure: LEFT HEART CATH AND CORONARY ANGIOGRAPHY;  Surgeon: Kathleene Hazel, MD;  Location: [facility] INVASIVE CV LAB;  Service: Cardiovascular;  Laterality: N/A;   SKIN TAG REMOVAL Right 2018   underarm   TEE WITHOUT CARDIOVERSION N/A 08/12/2017   Procedure: TRANSESOPHAGEAL ECHOCARDIOGRAM (TEE);  Surgeon: Alleen Borne, MD;  Location: [facility] OR;  Service: Open Heart Surgery;  Laterality: N/A;   Family History  Problem Relation Age of Onset   Liver cancer Mother    Lung disease Father    Lymphoma Brother    Esophageal cancer Brother    Leukemia Brother    Heart disease Brother    Anxiety disorder Son    Hypertension Son    Colon cancer Neg Hx    Colon polyps Neg Hx    Rectal cancer Neg Hx    Stomach cancer Neg Hx    Social History   Tobacco Use   Smoking status: Never   Smokeless tobacco: Never  Vaping Use   Vaping status: Never Used  Substance Use Topics   Alcohol use: Yes    Comment: 1-2 cans  beer monthly   Drug use: No    Comment: occaasional marijuana in 1960s   Current Outpatient Medications  Medication Sig Dispense Refill   acetaminophen (TYLENOL) 325 MG tablet Take 2 tablets (650 mg total) by mouth every 6 (six) hours as needed for mild pain.     amLODipine (NORVASC) 5 MG tablet Take 1 tablet (5 mg total) by mouth daily. 90 tablet 3   aspirin EC 81 MG tablet Take 1 tablet (81 mg total) by mouth daily.     atorvastatin (LIPITOR) 80 MG tablet TAKE 1 TABLET BY MOUTH ONCE DAILY AT  6PM 90 tablet 3   benazepril (LOTENSIN) 40 MG tablet Take 1 tablet by mouth once daily 90 tablet 0   esomeprazole (NEXIUM) 20 MG capsule Take 20 mg by mouth every morning.      ezetimibe (ZETIA) 10 MG tablet Take 1 tablet (10 mg total) by mouth daily. 90 tablet 3   levothyroxine (SYNTHROID) 100 MCG tablet Take  1 tablet by mouth once daily 90 tablet 0   mesalamine (LIALDA) 1.2 g EC tablet TAKE 2 TABLETS BY MOUTH ONCE DAILY IN THE MORNING WITH BREAKFAST 60 tablet 0   mesalamine (ROWASA) 4 g enema PLACE 60 MLS (4 G TOTAL) RECTALLY AT BEDTIME. (Patient taking differently: Place 4 g rectally at bedtime as needed.) 5400 mL 1   metoprolol tartrate (LOPRESSOR) 25 MG tablet Take 0.5 tablets (12.5 mg total) by mouth 2 (two) times daily. 90 tablet 3   Multiple Vitamin (MULTIVITAMIN) tablet Take 1 tablet by mouth daily.     nitroGLYCERIN (NITROSTAT) 0.4 MG SL tablet Place 1 tablet (0.4 mg total) under the tongue every 5 (five) minutes as needed for chest pain. 25 tablet 1   No current facility-administered medications for this visit.   No Known Allergies   Review of Systems: All systems reviewed and negative except where noted in HPI.    No results found.  Physical Exam: BP 132/72   Ht 5\' 10"  (1.778 m)   Wt 208 lb (94.3 kg)   BMI 29.84 kg/m  Constitutional: Pleasant,well-developed, Caucasian male in no acute distress.  Accompanied by spouse HEENT: Normocephalic and atraumatic. Conjunctivae are  normal. No scleral icterus. Neck supple.  Cardiovascular: Normal rate, regular rhythm.  Pulmonary/chest: Effort normal and breath sounds normal. No wheezing, rales or rhonchi. Abdominal: Soft, nondistended, nontender. Bowel sounds active throughout. There are no masses palpable. No hepatomegaly. Extremities: no edema Lymphadenopathy: No cervical adenopathy noted. Neurological: Alert and oriented to person place and time. Skin: Skin is warm and dry. No rashes noted. Psychiatric: Normal mood and affect. Behavior is normal.  CBC    Component Value Date/Time   WBC 5.9 05/23/2022 0807   RBC 4.22 05/23/2022 0807   HGB 13.2 05/23/2022 0807   HGB 12.0 (L) 09/04/2017 1145   HCT 38.1 (L) 05/23/2022 0807   HCT 35.5 (L) 09/04/2017 1145   PLT 211 05/23/2022 0807   PLT 297 09/04/2017 1145   MCV 90.3 05/23/2022 0807   MCV 94 09/04/2017 1145   MCH 31.3 05/23/2022 0807   MCHC 34.6 05/23/2022 0807   RDW 12.0 05/23/2022 0807   RDW 14.2 09/04/2017 1145   LYMPHSABS 1,363 05/23/2022 0807   LYMPHSABS 1.0 08/06/2017 1325   MONOABS 0.7 09/11/2016 1209   EOSABS 301 05/23/2022 0807   EOSABS 0.1 08/06/2017 1325   BASOSABS 83 05/23/2022 0807   BASOSABS 0.0 08/06/2017 1325    CMP     Component Value Date/Time   NA 140 05/23/2022 0807   NA 141 09/04/2017 1145   K 4.7 05/23/2022 0807   CL 107 05/23/2022 0807   CO2 26 05/23/2022 0807   GLUCOSE 102 (H) 05/23/2022 0807   BUN 17 05/23/2022 0807   BUN 15 09/04/2017 1145   CREATININE 1.02 05/23/2022 0807   CALCIUM 9.3 05/23/2022 0807   PROT 6.5 05/23/2022 0807   PROT 6.4 01/14/2018 0836   ALBUMIN 3.9 01/14/2018 0836   AST 20 05/23/2022 0807   ALT 23 05/23/2022 0807   ALKPHOS 88 01/14/2018 0836   BILITOT 0.6 05/23/2022 0807   BILITOT 0.3 01/14/2018 0836   GFRNONAA 64 04/18/2020 0839   GFRAA 74 04/18/2020 0839       Latest Ref Rng & Units 05/23/2022    8:07 AM 05/16/2021    8:15 AM 04/18/2020    8:39 AM  CBC EXTENDED  WBC 3.8 - 10.8  Thousand/uL 5.9  6.6  5.9   RBC 4.20 - 5.80  Million/uL 4.22  3.68  3.66   Hemoglobin 13.2 - 17.1 g/dL 44.0  34.7  42.5   HCT 38.5 - 50.0 % 38.1  36.8  35.8   Platelets 140 - 400 Thousand/uL 211  196  196   NEUT# 1,500 - 7,800 cells/uL 3,511  4,363  3,947   Lymph# 850 - 3,900 cells/uL 1,363  1,393  1,162       ASSESSMENT AND PLAN: 77 year old male with longstanding mild ulcerative colitis, macroscopically with inflammation limited to the left side, but with previous biopsy showing microscopic inflammation in the right colon.  His last colonoscopy was December 2022, at which she had mild inflammatory changes in the rectosigmoid colon.  He was switched from sulfasalazine to Lialda last year, and this is worked much better for him from a symptom standpoint.  He has never had any dysplasia on any random biopsies.  He had a 5 mm tubular adenoma removed on his last colonoscopy.  He is in fairly good health with no concerning cardiovascular symptoms.  Given his longstanding ulcerative colitis and good health, I think it is reasonable to schedule him for 1 more surveillance colonoscopy.  If we do not find any dysplasia or concerning polyps, we can probably stop dysplasia surveillance after this colonoscopy. Recommend he continue 2 tablets of Lialda daily for UC maintenance therapy.  We will get a baseline fecal calprotectin today.  Will make further recommendations on continued need for enemas based on calprotectin and colonoscopy findings, but unlikely that 2 enemas per month are providing much benefit.  Ulcerative colitis, clinical remission - Surveillance colonoscopy in December 2024 - Continue Lialda 2 tabs daily - Baseline calprotectin   Fabiola Mudgett E. Tomasa Rand, MD [facility]   Pickard, Priscille Heidelberg, MD

## 2022-12-27 ENCOUNTER — Other Ambulatory Visit: Payer: Medicare Other

## 2022-12-27 DIAGNOSIS — K5100 Ulcerative (chronic) pancolitis without complications: Secondary | ICD-10-CM

## 2022-12-30 LAB — CALPROTECTIN, FECAL: Calprotectin, Fecal: 510 ug/g — ABNORMAL HIGH (ref 0–120)

## 2023-01-02 NOTE — Progress Notes (Signed)
Stuart Gentry,  Your fecal calprotectin was elevated.  This indicates that there is active inflammation in your colon.  I would recommend you increase your Lialda to 4 tablets daily and use an enema nightly for 4 weeks.  After the 4 weeks, please contact us and let us know if there is any change in your bowel habits/GI symptoms.

## 2023-01-22 ENCOUNTER — Other Ambulatory Visit: Payer: Self-pay | Admitting: Family Medicine

## 2023-01-23 NOTE — Telephone Encounter (Signed)
Last OV 08/10/22 within protocol.  Requested Prescriptions  Pending Prescriptions Disp Refills   benazepril (LOTENSIN) 40 MG tablet [Pharmacy Med Name: Benazepril HCl 40 MG Oral Tablet] 90 tablet 0    Sig: Take 1 tablet by mouth once daily     Cardiovascular:  ACE Inhibitors Failed - 01/22/2023  9:58 AM      Failed - Cr in normal range and within 180 days    Creat  Date Value Ref Range Status  05/23/2022 1.02 0.70 - 1.28 mg/dL Final         Failed - K in normal range and within 180 days    Potassium  Date Value Ref Range Status  05/23/2022 4.7 3.5 - 5.3 mmol/L Final         Failed - Valid encounter within last 6 months    Recent Outpatient Visits           1 year ago Prostate cancer screening   [facility] Donita Brooks, MD   2 years ago General medical exam   [facility] Donita Brooks, MD   4 years ago General medical exam   [facility] Donita Brooks, MD   5 years ago General medical exam   [facility] Donita Brooks, MD   6 years ago Pure hypercholesterolemia   [facility] Pickard, Priscille Heidelberg, MD              Passed - Patient is not pregnant      Passed - Last BP in normal range    BP Readings from Last 1 Encounters:  12/18/22 132/72

## 2023-01-28 ENCOUNTER — Telehealth: Payer: Self-pay | Admitting: Gastroenterology

## 2023-01-28 NOTE — Telephone Encounter (Signed)
Patient called requesting to speak with a nurse regarding passing some blood.

## 2023-01-28 NOTE — Telephone Encounter (Signed)
Pt states this am he noticed BRB when he passed gas. Later today he had a BM and there was BRB on the tissue when he wiped. Pt scheduled to see Alcide Evener NP tomorrow at 2:30pm. Pt aware of appt.

## 2023-01-29 ENCOUNTER — Encounter: Payer: Self-pay | Admitting: Nurse Practitioner

## 2023-01-29 ENCOUNTER — Ambulatory Visit: Payer: Medicare Other | Admitting: Nurse Practitioner

## 2023-01-29 ENCOUNTER — Other Ambulatory Visit: Payer: Self-pay | Admitting: Family Medicine

## 2023-01-29 ENCOUNTER — Other Ambulatory Visit (INDEPENDENT_AMBULATORY_CARE_PROVIDER_SITE_OTHER): Payer: Medicare Other

## 2023-01-29 VITALS — BP 130/70 | HR 59 | Ht 70.0 in | Wt 212.0 lb

## 2023-01-29 DIAGNOSIS — K649 Unspecified hemorrhoids: Secondary | ICD-10-CM | POA: Diagnosis not present

## 2023-01-29 DIAGNOSIS — K5190 Ulcerative colitis, unspecified, without complications: Secondary | ICD-10-CM

## 2023-01-29 LAB — CBC WITH DIFFERENTIAL/PLATELET
Basophils Absolute: 0.1 10*3/uL (ref 0.0–0.1)
Basophils Relative: 1.4 % (ref 0.0–3.0)
Eosinophils Absolute: 0.2 10*3/uL (ref 0.0–0.7)
Eosinophils Relative: 3.8 % (ref 0.0–5.0)
HCT: 36.7 % — ABNORMAL LOW (ref 39.0–52.0)
Hemoglobin: 12.1 g/dL — ABNORMAL LOW (ref 13.0–17.0)
Lymphocytes Relative: 17.4 % (ref 12.0–46.0)
Lymphs Abs: 1.1 10*3/uL (ref 0.7–4.0)
MCHC: 32.9 g/dL (ref 30.0–36.0)
MCV: 92.1 fL (ref 78.0–100.0)
Monocytes Absolute: 0.6 10*3/uL (ref 0.1–1.0)
Monocytes Relative: 9.7 % (ref 3.0–12.0)
Neutro Abs: 4.2 10*3/uL (ref 1.4–7.7)
Neutrophils Relative %: 67.7 % (ref 43.0–77.0)
Platelets: 240 10*3/uL (ref 150.0–400.0)
RBC: 3.99 Mil/uL — ABNORMAL LOW (ref 4.22–5.81)
RDW: 13.2 % (ref 11.5–15.5)
WBC: 6.2 10*3/uL (ref 4.0–10.5)

## 2023-01-29 LAB — COMPREHENSIVE METABOLIC PANEL
ALT: 16 U/L (ref 0–53)
AST: 18 U/L (ref 0–37)
Albumin: 3.7 g/dL (ref 3.5–5.2)
Alkaline Phosphatase: 85 U/L (ref 39–117)
BUN: 16 mg/dL (ref 6–23)
CO2: 26 meq/L (ref 19–32)
Calcium: 9.4 mg/dL (ref 8.4–10.5)
Chloride: 106 meq/L (ref 96–112)
Creatinine, Ser: 1.03 mg/dL (ref 0.40–1.50)
GFR: 69.74 mL/min (ref >60.00)
Glucose, Bld: 97 mg/dL (ref 70–99)
Potassium: 4.3 meq/L (ref 3.5–5.1)
Sodium: 138 meq/L (ref 135–145)
Total Bilirubin: 0.5 mg/dL (ref 0.2–1.2)
Total Protein: 7 g/dL (ref 6.0–8.3)

## 2023-01-29 LAB — C-REACTIVE PROTEIN: CRP: <1 mg/dL (ref 0.5–20.0)

## 2023-01-29 MED ORDER — NA SULFATE-K SULFATE-MG SULF 17.5-3.13-1.6 GM/177ML PO SOLN: ORAL | 0 refills | Status: DC

## 2023-01-29 MED ORDER — HYDROCORTISONE ACETATE 25 MG RE SUPP: 25.0000 mg | Freq: Every day | RECTAL | 0 refills | Status: AC

## 2023-01-29 MED ORDER — HYDROCORTISONE ACETATE 25 MG RE SUPP: 25.0000 mg | Freq: Every day | RECTAL | 1 refills | Status: DC

## 2023-01-29 NOTE — Telephone Encounter (Signed)
Requested medications are due for refill today.  yes  Requested medications are on the active medications list.  yes  Last refill. 11/05/2022 #90 0 rf  Future visit scheduled.   no  Notes to clinic.  Abnormal labs.    Requested Prescriptions  Pending Prescriptions Disp Refills   levothyroxine (SYNTHROID) 100 MCG tablet [Pharmacy Med Name: Levothyroxine Sodium 100 MCG Oral Tablet] 90 tablet 0    Sig: Take 1 tablet by mouth once daily     Endocrinology:  Hypothyroid Agents Failed - 01/29/2023 11:18 AM      Failed - TSH in normal range and within 360 days    TSH  Date Value Ref Range Status  05/16/2021 6.48 (H) 0.40 - 4.50 mIU/L Final         Failed - Valid encounter within last 12 months    Recent Outpatient Visits           1 year ago Prostate cancer screening   [facility] Donita Brooks, MD   2 years ago General medical exam   [facility] Donita Brooks, MD   4 years ago General medical exam   [facility] Donita Brooks, MD   5 years ago General medical exam   [facility] Donita Brooks, MD   6 years ago Pure hypercholesterolemia   [facility] Pickard, Priscille Heidelberg, MD

## 2023-01-29 NOTE — Patient Instructions (Addendum)
Apply a small amount of Desitin inside the anal opening and to the external anal area three times daily as needed for anal or hemorrhoidal irritation/bleeding.   Your provider has requested that you go to the basement level for lab work before leaving today. Press "B" on the elevator. The lab is located at the first door on the left as you exit the elevator.  Stop Mesalamine enemas.  Lialda- take 4 tablets by mouth daily  Due to recent changes in healthcare laws, you may see the results of your imaging and laboratory studies on MyChart before your provider has had a chance to review them.  We understand that in some cases there may be results that are confusing or concerning to you. Not all laboratory results come back in the same time frame and the provider may be waiting for multiple results in order to interpret others.  Please give [place] 48 hours in order for your provider to thoroughly review all the results before contacting the office for clarification of your results.   Thank you for trusting me with your gastrointestinal care!   Alcide Evener, CRNP

## 2023-01-29 NOTE — Progress Notes (Signed)
01/29/2023 Stuart Gentry [id number] [birth year]   Chief Complaint: Bright red blood per the rectum   History of Present Illness: Stuart Gentry is a 77 year old male with a past medical history of hypertension, coronary artery disease s/p CABG 2019 with post-op afib, hypothyroidism, CKD and ulcerative colitis. Previously followed by Dr. Christella Hartigan then by Dr. Tomasa Rand since 11/2022. Elevated fecal calprotectin level 510 on 12/27/2022 and Lialda was increased from 2 tabs to 4 tabs po every day and he was instructed to take Mesalamine 4gm enemas Q HS x 4 weeks (previously used Mesalamine enemas every 4 days PRN). He passed a small to moderate amount of bright red blood per the rectum when passing gas x 2 on 01/28/2023 which was a unusual for him. No associated rectal pain. He passed a normal stool today without rectal bleeding. He typically passes a solid stool then subsequent stools later the same day are oatmeal and mudlike. He did not take the Mesalamine enema last night and this morning he reduced Lialda back down to 1.2 gm two tabs. He sometimes has LLQ discomfort which abates after he passes a BM.  His most recent colonoscopy was 03/31/2021 which showed a normal terminal ileum, a single 5 mm tubular adenoma was removed, right and left colon biopsies were normal and biopsies from the rectosigmoid showed chronic inflammation. He is currently scheduled for a colonoscopy with Dr. Tomasa Rand 04/05/2023. No GERD symptoms on Esomeprazole 20 mg p.o. daily.  On ASA 81 mg daily secondary to CAD, no other NSAID use.  He has a history of coronary artery disease s/p CABG on Metoprolol 12.5 mg p.o. twice daily.  His initial heart rate today was 47 b/m, repeat heart rate 56 and 59 b/m.  BP 130/70.  He denies having any chest pain, palpitations, dizziness or shortness of breath.        Latest Ref Rng & Units 05/23/2022    8:07 AM 05/16/2021    8:15 AM 04/18/2020    8:39 AM  CBC  WBC 3.8 - 10.8 Thousand/uL 5.9   6.6  5.9   Hemoglobin 13.2 - 17.1 g/dL 57.8  46.9  62.9   Hematocrit 38.5 - 50.0 % 38.1  36.8  35.8   Platelets 140 - 400 Thousand/uL 211  196  196        Latest Ref Rng & Units 05/23/2022    8:07 AM 05/16/2021    8:15 AM 04/18/2020    8:39 AM  CMP  Glucose 65 - 99 mg/dL 528  413  244   BUN 7 - 25 mg/dL 17  16  16    Creatinine 0.70 - 1.28 mg/dL 0.10  2.72  5.36   Sodium 135 - 146 mmol/L 140  140  141   Potassium 3.5 - 5.3 mmol/L 4.7  5.6  5.1   Chloride 98 - 110 mmol/L 107  107  107   CO2 20 - 32 mmol/L 26  29  26    Calcium 8.6 - 10.3 mg/dL 9.3  9.4  9.4   Total Protein 6.1 - 8.1 g/dL 6.5  6.4  6.3   Total Bilirubin 0.2 - 1.2 mg/dL 0.6  0.5  0.5   AST 10 - 35 U/L 20  17  20    ALT 9 - 46 U/L 23  17  15      Review of pertinent gastrointestinal problems:   1. Diagnosed with UC in mid 1980s in [place] bloody diarrhea; Moved to [facility]  [place], GI MD Dr. Jodi Marble cared for 1990s; was on prednisone early on in problem. Seems to flare 1-2 per year with urgency, mucous output maybe a bit of blood. Will take rowasa enemas for a month. Chronically on sulfasalazine. He has been getting q two year colonoscopies.Has believes that he has only had a very limited left sided disease. Multiple colonoscopies done over the past 20 years. I reviewed many of them from Dr. Jodi Marble; it is clear that he has had only macroscopic disease in the rectosigmoid colon up to about 25 cm however biopsies of his descending, right colon have also shown changes of chronic inflammatory bowel disease several times. His last colonoscopy outside was January 2013. Colonoscopy 05/2013 Christella Hartigan terminal ileum was normal. There was clear, moderate inflammation from anus to about 60cm proximal (very indistinct transition to normal mucosa). The right colon was essentially normal appearing. Biopsies showed chronic and active inflammation in left colon; right colon was normal. 06/2012 changed to mesalamine full strength, nightly rowasa.  2015, continued  intermittent flares, offered several options for medical management, decided to increase the sulfasalazine to 6 g daily, also mesalamine enemas. Colonoscopy Dr. Christella Hartigan 10/2016: Normal TI, mild inflammation to 30cm (path quiencent colitis), focal hepatic flexure inflammation (chronic moderately active colitis). Random colon biopsies without dysplasia.  Colonoscopy August 2020 Dr. Christella Hartigan the terminal ileum was normal, the distal colon was mildly inflamed overtly with discrete transition from abnormal to normal mucosa around 40 cm from the anus.  Extensive biopsies were taken.  Right colon biopsies showed "inactive colitis and colitis" and the left colon showed active mild inflammation.  He was recommended to have follow-up in 1 to 2 months after adjusting his medicines. Colonoscopy December 2022 showed normal terminal ileum single 5 mm tubular adenoma was removed.  Right colon was normal, left colon was normal.  Rectosigmoid was erythematous with a non a discrete transition from normal to abnormal mucosa around 20 cm.  Biopsies from right and left colon were normal, biopsies from rectosigmoid showed chronic inflammation.  I asked that he resume his mesalamine enemas on a daily basis rather than every 2 or 3 days. 2. Upper endoscopy 2013, small 'ulcers in stomach.' Ulcers had healed on nexium daily. Small submucosal lesion in EGD, eventually evaluated at [facility]. DR. Germain Osgood by EUS, was told it was fatty lesion (lipoma) and it needed no further following. 3. Ischemic colitis (likely) 08/2016: acute abd pain, prominantly inflammed sigmoid segment on CT 08/2016  Current Outpatient Medications on File Prior to Visit  Medication Sig Dispense Refill   acetaminophen (TYLENOL) 325 MG tablet Take 2 tablets (650 mg total) by mouth every 6 (six) hours as needed for mild pain.     amLODipine (NORVASC) 5 MG tablet Take 1 tablet (5 mg total) by mouth daily. 90 tablet 3   aspirin EC 81 MG tablet Take 1 tablet (81 mg total) by mouth  daily.     atorvastatin (LIPITOR) 80 MG tablet TAKE 1 TABLET BY MOUTH ONCE DAILY AT  6PM 90 tablet 3   benazepril (LOTENSIN) 40 MG tablet Take 1 tablet by mouth once daily 90 tablet 0   esomeprazole (NEXIUM) 20 MG capsule Take 20 mg by mouth every morning.      ezetimibe (ZETIA) 10 MG tablet Take 1 tablet (10 mg total) by mouth daily. 90 tablet 3   levothyroxine (SYNTHROID) 100 MCG tablet Take 1 tablet by mouth once daily 90 tablet 0   mesalamine (LIALDA) 1.2 g EC tablet Take  2 tablets (2.4 g total) by mouth daily. (Patient taking differently: Take 2.4 g by mouth daily. Send prescription [facility]) 180 tablet 3   mesalamine (ROWASA) 4 g enema PLACE 60 MLS (4 G TOTAL) RECTALLY AT BEDTIME. (Patient taking differently: Place 4 g rectally at bedtime as needed. [facility] in [place]) 5400 mL 1   metoprolol tartrate (LOPRESSOR) 25 MG tablet Take 0.5 tablets (12.5 mg total) by mouth 2 (two) times daily. 90 tablet 3   Multiple Vitamin (MULTIVITAMIN) tablet Take 1 tablet by mouth daily.     nitroGLYCERIN (NITROSTAT) 0.4 MG SL tablet Place 1 tablet (0.4 mg total) under the tongue every 5 (five) minutes as needed for chest pain. 25 tablet 1   No current facility-administered medications on file prior to visit.   No Known Allergies   Current Medications, Allergies, Past Medical History, Past Surgical History, Family History and Social History were reviewed in [employer] record.  Review of Systems:   Constitutional: Negative for fever, sweats, chills or weight loss.  Respiratory: Negative for shortness of breath.   Cardiovascular: Negative for chest pain, palpitations and leg swelling.  Gastrointestinal: See HPI.  Musculoskeletal: Negative for back pain or muscle aches.  Neurological: Negative for dizziness, headaches or paresthesias.    Physical Exam: BP 130/70   Pulse (!) 47   Ht 5\' 10"  (1.778 m)   Wt 212 lb (96.2 kg)   BMI 30.42 kg/m   General: 77 year old male in no acute distress. Head: Normocephalic and atraumatic. Eyes: No scleral icterus. Conjunctiva pink . Ears: Normal auditory acuity. Mouth: Dentition intact. No ulcers or lesions.  Lungs: Clear throughout to auscultation. Heart: Regular rate and rhythm, no murmur. Abdomen: Soft, nontender and nondistended. No masses or hepatomegaly. Normal bowel sounds x 4 quadrants.  Rectal: No anal fissures or significant external hemorrhoids.  Anal hemorrhoids mildly inflamed, left lateral hemorrhoid mildly friable without active bleeding. Prostate enlarged when patient examined laying on his left side. June CMA present during exam.  Musculoskeletal: Symmetrical with no gross deformities. Extremities: No edema. Neurological: Alert oriented x 4. No focal deficits.  Psychological: Alert and cooperative. Normal mood and affect  Assessment and Recommendations:  77 year old male with ulcerative colitis previously on Mesalamine 1.2 g 2 tabs daily and Mesalamine enema as needed. Fecal calprotectin level was elevated on 12/27/2022 and Lialda was increased to 4 tabs daily and Mesalamine 4 g enema nightly x 4 weeks. Patient developed a small to moderate amount of bright red blood per the rectum when passing flatus x 2 episodes on 01/28/2023.  Patient passed a soft bowel movement earlier today without further bright red blood per the rectum.  Infrequent LLQ pain which abates after passing a BM.  Rectal exam today showed mildly inflamed anal hemorrhoids without active bleeding. -Anusol HC 25 mg suppository 1 PR nightly x 5 nights -Apply a small amount of Desitin inside the anal opening and to the external anal area three times daily as needed for anal or hemorrhoidal irritation/bleeding.  -Stop Mesalamine enemas -Continue Lialda 1.2 g 4 tabs p.o. daily -CBC, CMP and CRP -Proceed with colonoscopy as scheduled 04/05/2023  History of colon polyps.  Colonoscopy 03/2021 identified one 5 mm tubular  adenomatous polyp removed from the colon. -See plan above   Bradycardia on Metoprolol 12.5 mg p.o. twice daily.  Asymptomatic. -Patient instructed to contact his cardiologist to verify Metoprolol dosing with initial heart rate in office today 47 B/M, repeat heart rate  56-59 B/M. -Patient will also recheck his blood pressure and heart rate at home  History of CAD s/p CABG 2019  History of GERD, stable as Omeprazole 20 Mg daily

## 2023-01-30 ENCOUNTER — Ambulatory Visit: Payer: Medicare Other

## 2023-01-30 ENCOUNTER — Telehealth: Payer: Self-pay | Admitting: Cardiovascular Disease

## 2023-01-30 ENCOUNTER — Encounter: Payer: Self-pay | Admitting: Cardiovascular Disease

## 2023-01-30 ENCOUNTER — Other Ambulatory Visit: Payer: Self-pay

## 2023-01-30 DIAGNOSIS — K5190 Ulcerative colitis, unspecified, without complications: Secondary | ICD-10-CM

## 2023-01-30 DIAGNOSIS — D649 Anemia, unspecified: Secondary | ICD-10-CM

## 2023-01-30 DIAGNOSIS — K649 Unspecified hemorrhoids: Secondary | ICD-10-CM | POA: Diagnosis not present

## 2023-01-30 LAB — IBC + FERRITIN
Ferritin: 54.5 ng/mL (ref 22.0–322.0)
Iron: 103 ug/dL (ref 42–165)
Saturation Ratios: 33.9 % (ref 20.0–50.0)
TIBC: 303.8 ug/dL (ref 250.0–450.0)
Transferrin: 217 mg/dL (ref 212.0–360.0)

## 2023-01-30 LAB — FOLATE: Folate: >24.2 ng/mL (ref >5.9)

## 2023-01-30 LAB — VITAMIN B12: Vitamin B-12: 307 pg/mL (ref 211–911)

## 2023-01-30 NOTE — Telephone Encounter (Signed)
Called patient, see mychart message from today.

## 2023-01-30 NOTE — Progress Notes (Signed)
Agree with the assessment and plan as outlined by Colleen Kennedy-Smith, NP.    Cambrea Kirt E. Vernal Rutan, MD [facility]  

## 2023-01-30 NOTE — Telephone Encounter (Signed)
STAT if HR is under 50 or over 120 (normal HR is 60-100 beats per minute)  What is your heart rate? 59  Do you have a log of your heart rate readings (document readings)? 47-59  Do you have any other symptoms? Patient states that he is experiencing lower HR and would like to speak with someone in regards to this.

## 2023-01-30 NOTE — Telephone Encounter (Signed)
Called patient back about message.  Patient stated he had no symptoms and he has been doing fine. He is going to keep checking his pulse tomorrow and let us know if his HR keeps dipping into the 40's. Will forward to Dr. Eden Emms for advisement.

## 2023-01-31 MED ORDER — METOPROLOL TARTRATE 25 MG PO TABS: 12.5000 mg | ORAL_TABLET | Freq: Every day | ORAL | 3 refills | Status: DC

## 2023-01-31 MED ORDER — AMLODIPINE BESYLATE 10 MG PO TABS: 10.0000 mg | ORAL_TABLET | Freq: Every day | ORAL | 3 refills | Status: DC

## 2023-01-31 NOTE — Telephone Encounter (Signed)
Per Dr. Eden Emms, Can't take lopressor only once/day in am stop pm dose Increase norvasc to 10 mg to make sure BP doesn't go too high this will not slow HR   Called patient with changes. Patient verbalized understanding.

## 2023-02-07 ENCOUNTER — Encounter: Payer: Self-pay | Admitting: Family Medicine

## 2023-02-07 ENCOUNTER — Ambulatory Visit: Payer: Medicare Other | Admitting: Family Medicine

## 2023-02-07 VITALS — BP 126/72 | HR 50 | Temp 97.7°F | Ht 70.0 in | Wt 212.6 lb

## 2023-02-07 DIAGNOSIS — Z951 Presence of aortocoronary bypass graft: Secondary | ICD-10-CM

## 2023-02-07 DIAGNOSIS — E7800 Pure hypercholesterolemia, unspecified: Secondary | ICD-10-CM | POA: Diagnosis not present

## 2023-02-07 DIAGNOSIS — I10 Essential (primary) hypertension: Secondary | ICD-10-CM

## 2023-02-07 NOTE — Progress Notes (Signed)
Subjective:    Patient ID: Stuart Gentry, male    DOB: Dec 17, 1945, 77 y.o.   MRN: [id number]  HPI Patient is here for follow-up. PMH is significant for severe 3 vessel disease s/p CABG with Dr Laneta Simmers.  Post op course complicated by PAF converted with amiodarone and no anticoagulation needed.   Had colonoscopy in 2022.  Has repeat colonoscopy schedule for December 2024.  PSA was normal in 1/24.  Patient has had his flu shot and COVID shot at another facility.  He politely declines a shingles vaccine.  He recently reduced the dose of metoprolol due to bradycardia.  His heart rate at home is in the 70s.  He is completely asymptomatic.  He denies any lightheadedness, dizziness, syncope, or near syncope.  He denies any chest pain or shortness of breath or dyspnea on exertion. Appointment on 01/30/2023  Component Date Value Ref Range Status   Folate 01/30/2023 >24.2  >5.9 ng/mL Final   Vitamin B-12 01/30/2023 307  211 - 911 pg/mL Final   Iron 01/30/2023 103  42 - 165 ug/dL Final   Transferrin 40/98/1191 217.0  212.0 - 360.0 mg/dL Final   Saturation Ratios 01/30/2023 33.9  20.0 - 50.0 % Final   Ferritin 01/30/2023 54.5  22.0 - 322.0 ng/mL Final   TIBC 01/30/2023 303.8  250.0 - 450.0 mcg/dL Final  Appointment on 47/82/9562  Component Date Value Ref Range Status   CRP 01/29/2023 <1.0  0.5 - 20.0 mg/dL Final   Sodium 13/11/6576 138  135 - 145 mEq/L Final   Potassium 01/29/2023 4.3  3.5 - 5.1 mEq/L Final   Chloride 01/29/2023 106  96 - 112 mEq/L Final   CO2 01/29/2023 26  19 - 32 mEq/L Final   Glucose, Bld 01/29/2023 97  70 - 99 mg/dL Final   BUN 46/96/2952 16  6 - 23 mg/dL Final   Creatinine, Ser 01/29/2023 1.03  0.40 - 1.50 mg/dL Final   Total Bilirubin 01/29/2023 0.5  0.2 - 1.2 mg/dL Final   Alkaline Phosphatase 01/29/2023 85  39 - 117 U/L Final   AST 01/29/2023 18  0 - 37 U/L Final   ALT 01/29/2023 16  0 - 53 U/L Final   Total Protein 01/29/2023 7.0  6.0 - 8.3 g/dL Final   Albumin  84/13/2440 3.7  3.5 - 5.2 g/dL Final   GFR 02/24/2535 69.74  >60.00 mL/min Final   Calculated using the CKD-EPI Creatinine Equation (2021)   Calcium 01/29/2023 9.4  8.4 - 10.5 mg/dL Final   WBC 64/40/3474 6.2  4.0 - 10.5 K/uL Final   RBC 01/29/2023 3.99 (L)  4.22 - 5.81 Mil/uL Final   Hemoglobin 01/29/2023 12.1 (L)  13.0 - 17.0 g/dL Final   HCT 25/95/6387 36.7 (L)  39.0 - 52.0 % Final   MCV 01/29/2023 92.1  78.0 - 100.0 fl Final   MCHC 01/29/2023 32.9  30.0 - 36.0 g/dL Final   RDW 56/43/3295 13.2  11.5 - 15.5 % Final   Platelets 01/29/2023 240.0  150.0 - 400.0 K/uL Final   Neutrophils Relative % 01/29/2023 67.7  43.0 - 77.0 % Final   Lymphocytes Relative 01/29/2023 17.4  12.0 - 46.0 % Final   Monocytes Relative 01/29/2023 9.7  3.0 - 12.0 % Final   Eosinophils Relative 01/29/2023 3.8  0.0 - 5.0 % Final   Basophils Relative 01/29/2023 1.4  0.0 - 3.0 % Final   Neutro Abs 01/29/2023 4.2  1.4 - 7.7 K/uL Final  Lymphs Abs 01/29/2023 1.1  0.7 - 4.0 K/uL Final   Monocytes Absolute 01/29/2023 0.6  0.1 - 1.0 K/uL Final   Eosinophils Absolute 01/29/2023 0.2  0.0 - 0.7 K/uL Final   Basophils Absolute 01/29/2023 0.1  0.0 - 0.1 K/uL Final    Past Medical History:  Diagnosis Date   Chronic kidney disease 1965   kidney stones   Coronary artery calcification seen on CAT scan    GERD (gastroesophageal reflux disease)    Hypercholesteremia    Hypertension    Hypothyroidism    Euthyrox   Myocardial infarction [facility])    Sleep apnea    never been tested   Ulcerative colitis    Past Surgical History:  Procedure Laterality Date   COLONOSCOPY  2020   has had several due to UC   CORONARY ARTERY BYPASS GRAFT N/A 08/12/2017   Procedure: CORONARY ARTERY BYPASS GRAFTING (CABG) x six , using left internal mammory artery and right leg greater saphenous vein harvested endoscopically.;  Surgeon: Alleen Borne, MD;  Location: [facility] OR;  Service: Open Heart Surgery;  Laterality: N/A;   LEFT HEART CATH AND  CORONARY ANGIOGRAPHY N/A 08/09/2017   Procedure: LEFT HEART CATH AND CORONARY ANGIOGRAPHY;  Surgeon: Kathleene Hazel, MD;  Location: [facility] INVASIVE CV LAB;  Service: Cardiovascular;  Laterality: N/A;   SKIN TAG REMOVAL Right 2018   underarm   TEE WITHOUT CARDIOVERSION N/A 08/12/2017   Procedure: TRANSESOPHAGEAL ECHOCARDIOGRAM (TEE);  Surgeon: Alleen Borne, MD;  Location: [facility] OR;  Service: Open Heart Surgery;  Laterality: N/A;   Current Outpatient Medications on File Prior to Visit  Medication Sig Dispense Refill   acetaminophen (TYLENOL) 325 MG tablet Take 2 tablets (650 mg total) by mouth every 6 (six) hours as needed for mild pain.     amLODipine (NORVASC) 10 MG tablet Take 1 tablet (10 mg total) by mouth daily. 90 tablet 3   aspirin EC 81 MG tablet Take 1 tablet (81 mg total) by mouth daily.     atorvastatin (LIPITOR) 80 MG tablet TAKE 1 TABLET BY MOUTH ONCE DAILY AT  6PM 90 tablet 3   benazepril (LOTENSIN) 40 MG tablet Take 1 tablet by mouth once daily 90 tablet 0   esomeprazole (NEXIUM) 20 MG capsule Take 20 mg by mouth every morning.      ezetimibe (ZETIA) 10 MG tablet Take 1 tablet (10 mg total) by mouth daily. 90 tablet 3   hydrocortisone (ANUSOL-HC) 25 MG suppository Place 1 suppository (25 mg total) rectally at bedtime. 5 suppository 0   levothyroxine (SYNTHROID) 100 MCG tablet Take 1 tablet by mouth once daily 90 tablet 0   mesalamine (LIALDA) 1.2 g EC tablet Take 2 tablets (2.4 g total) by mouth daily. (Patient taking differently: Take 2.4 g by mouth daily. Send prescription to [facility]) 180 tablet 3   mesalamine (ROWASA) 4 g enema PLACE 60 MLS (4 G TOTAL) RECTALLY AT BEDTIME. (Patient taking differently: Place 4 g rectally at bedtime as needed. [facility] [place]) 5400 mL 1   metoprolol tartrate (LOPRESSOR) 25 MG tablet Take 0.5 tablets (12.5 mg total) by mouth daily. 45 tablet 3   Multiple Vitamin (MULTIVITAMIN) tablet Take 1 tablet by mouth  daily.     Na Sulfate-K Sulfate-Mg Sulf 17.5-3.13-1.6 GM/177ML SOLN Use as directed; may use generic; goodrx card if insurance will not cover generic 354 mL 0   nitroGLYCERIN (NITROSTAT) 0.4 MG SL tablet Place 1 tablet (0.4  mg total) under the tongue every 5 (five) minutes as needed for chest pain. 25 tablet 1   No current facility-administered medications on file prior to visit.   No Known Allergies Social History   Socioeconomic History   Marital status: Married    Spouse name: Not on file   Number of children: 3   Years of education: Not on file   Highest education level: 12th grade  Occupational History   Occupation: Retired  Tobacco Use   Smoking status: Never   Smokeless tobacco: Never  Vaping Use   Vaping status: Never Used  Substance and Sexual Activity   Alcohol use: Yes    Comment: 1-2 cans beer monthly   Drug use: No    Comment: occaasional marijuana in 1960s   Sexual activity: Yes  Other Topics Concern   Not on file  Social History Narrative   Not on file   Social Determinants of Health   Financial Resource Strain: Low Risk  (08/06/2022)   Overall Financial Resource Strain (CARDIA)    Difficulty of Paying Living Expenses: Not hard at all  Food Insecurity: No Food Insecurity (08/06/2022)   Hunger Vital Sign    Worried About Running Out of Food in the Last Year: Never true    Ran Out of Food in the Last Year: Never true  Transportation Needs: No Transportation Needs (08/06/2022)   PRAPARE - Administrator, Civil Service (Medical): No    Lack of Transportation (Non-Medical): No  Physical Activity: Insufficiently Active (08/06/2022)   Exercise Vital Sign    Days of Exercise per Week: 1 day    Minutes of Exercise per Session: 20 min  Stress: No Stress Concern Present (08/06/2022)   [employer] of Occupational Health - Occupational Stress Questionnaire    Feeling of Stress : Not at all  Social Connections: Moderately Isolated (08/06/2022)   Social  Connection and Isolation Panel [NHANES]    Frequency of Communication with Friends and Family: More than three times a week    Frequency of Social Gatherings with Friends and Family: Twice a week    Attends Religious Services: Never    Database administrator or Organizations: No    Attends Engineer, structural: Not on file    Marital Status: Married  Catering manager Violence: Not on file     Review of Systems  All other systems reviewed and are negative.      Objective:   Physical Exam Vitals reviewed.  Constitutional:      General: He is not in acute distress.    Appearance: Normal appearance. He is well-developed. He is not ill-appearing, toxic-appearing or diaphoretic.  HENT:     Head: Normocephalic and atraumatic.     Right Ear: Tympanic membrane, ear canal and external ear normal.     Left Ear: Tympanic membrane, ear canal and external ear normal.     Nose: Nose normal.     Mouth/Throat:     Pharynx: No oropharyngeal exudate.  Eyes:     General: No scleral icterus.       Right eye: No discharge.        Left eye: No discharge.     Conjunctiva/sclera: Conjunctivae normal.     Pupils: Pupils are equal, round, and reactive to light.  Neck:     Thyroid: No thyromegaly.     Vascular: No carotid bruit or JVD.     Trachea: No tracheal deviation.  Cardiovascular:  Rate and Rhythm: Bradycardia present. Rhythm irregular.     Heart sounds: Normal heart sounds. No murmur heard.    No friction rub. No gallop.  Pulmonary:     Effort: Pulmonary effort is normal. No respiratory distress.     Breath sounds: Normal breath sounds. No stridor. No wheezing, rhonchi or rales.  Chest:     Chest wall: No tenderness.  Abdominal:     General: Bowel sounds are normal. There is no distension.     Palpations: Abdomen is soft. There is no mass.     Tenderness: There is no abdominal tenderness. There is no guarding or rebound.     Hernia: A hernia is present.  Musculoskeletal:         General: No tenderness or deformity.     Cervical back: Normal range of motion and neck supple. No rigidity or tenderness.  Lymphadenopathy:     Cervical: No cervical adenopathy.  Skin:    General: Skin is warm.     Coloration: Skin is not jaundiced or pale.     Findings: No bruising, erythema, lesion or rash.  Neurological:     General: No focal deficit present.     Mental Status: He is alert and oriented to person, place, and time. Mental status is at baseline.     Cranial Nerves: No cranial nerve deficit.     Sensory: No sensory deficit.     Motor: No weakness or abnormal muscle tone.     Coordination: Coordination normal.     Deep Tendon Reflexes: Reflexes are normal and symmetric.  Psychiatric:        Behavior: Behavior normal.        Thought Content: Thought content normal.        Judgment: Judgment normal.           Assessment & Plan:  S/P CABG x 6 - Plan: Lipid panel  Hypercholesteremia - Plan: Lipid panel  Primary hypertension - Plan: Lipid panel Cardiology recently increase his amlodipine.  His blood pressure today is excellent.  He is bradycardic asymptomatic.  His apparently has only been on a beta-blocker if possible.  Patient will monitor for any symptoms and if he develops lightheadedness or dizziness or near syncope we will discontinue metoprolol at the present time we will make no changes.  I reviewed his lab work that he recently had at his gastroenterologist.  We decided to defer his cholesterol until when he comes in with his physical in January.  At that time we will check his cholesterol along with his PSA

## 2023-02-13 ENCOUNTER — Encounter: Payer: Self-pay | Admitting: Gastroenterology

## 2023-02-13 ENCOUNTER — Telehealth: Payer: Self-pay | Admitting: Nurse Practitioner

## 2023-02-13 NOTE — Telephone Encounter (Signed)
Inbound call from patient stating he needs a refill for melamine medication. States his prescription was changed to take 4 tablets a day instead of 2. Patient states he has about a week left and would like refill to be sent to [facility]. Patient also requesting a call to discuss if he should continue taking 4 a day. Please advise, thank you

## 2023-02-14 MED ORDER — MESALAMINE 1.2 G PO TBEC: 1.2000 g | DELAYED_RELEASE_TABLET | Freq: Four times a day (QID) | ORAL | 1 refills | Status: DC

## 2023-02-14 NOTE — Telephone Encounter (Signed)
Contacted pt & pt is aware of refill of Mesalamine being sent to [facility].

## 2023-02-14 NOTE — Addendum Note (Signed)
Addended by: Rise Paganini on: 02/14/2023 04:05 PM   Modules accepted: Orders

## 2023-02-14 NOTE — Telephone Encounter (Signed)
DD, ok to send in RX for Mesalamine 1.2 gram tabs take four tabs by mouth once daily # 360 with 1 additional refill THX.

## 2023-02-14 NOTE — Telephone Encounter (Signed)
Colleen, please advise.

## 2023-02-14 NOTE — Telephone Encounter (Signed)
Inbound call from patient stating a prescription for 4 times a day was [facility]. Patient states he is not having any current symptoms and would like for the prescription to be changed to 2 times a day instead. Please advise, thank you.

## 2023-02-15 ENCOUNTER — Other Ambulatory Visit: Payer: Self-pay

## 2023-02-15 MED ORDER — MESALAMINE 1.2 G PO TBEC: 2.4000 g | DELAYED_RELEASE_TABLET | Freq: Every day | ORAL | 3 refills | Status: DC

## 2023-02-18 ENCOUNTER — Encounter: Payer: Self-pay | Admitting: Family Medicine

## 2023-03-12 ENCOUNTER — Telehealth: Payer: Self-pay | Admitting: Gastroenterology

## 2023-03-12 ENCOUNTER — Telehealth: Payer: Self-pay | Admitting: Nurse Practitioner

## 2023-03-12 NOTE — Telephone Encounter (Signed)
Contacted pt & pt is aware that he will not need a previsit since he was seen in office on 10/1. Previsit was cancelled.

## 2023-03-12 NOTE — Telephone Encounter (Signed)
Inbound call from patient requesting to know if he needs 11/19 PV. States he received prep instructions and medication at 10/1 office visit. Requesting for a follow up call. Please advise, thank you.

## 2023-03-12 NOTE — Telephone Encounter (Signed)
Error

## 2023-03-18 ENCOUNTER — Encounter: Payer: Self-pay | Admitting: Gastroenterology

## 2023-04-01 ENCOUNTER — Telehealth: Payer: Self-pay | Admitting: Nurse Practitioner

## 2023-04-01 NOTE — Telephone Encounter (Signed)
Inbound call from patient states he has a procedure on 12/6 with Dr. Tomasa Rand. Patient states he has not been able to have a bowel movement in over a week. Patient would like recommendations prior to his procedure and also to know if procedure can be kept due to his constipation. Please advise.

## 2023-04-01 NOTE — Telephone Encounter (Signed)
Spoke with the patient. He reports no bowel movement in 5 days or so. He used a mesalamine enema last night and then again this morning. Since his initial phone call to [place] this morning he has had 2 bowel movements. Describes these as formed and large. He does not feel he has fully emptied. He does feel more comfortable. Recommended a dose of Miralax daily until having good bowel movements and feeling more comfortable.  Patient asks if he should continue the daily mesalamine enemas?

## 2023-04-02 NOTE — Telephone Encounter (Signed)
Pt made aware of Dr. Tomasa Rand recommendations: Pt verbalized understanding with all questions answered.

## 2023-04-03 ENCOUNTER — Encounter: Payer: Self-pay | Admitting: Gastroenterology

## 2023-04-05 ENCOUNTER — Ambulatory Visit (AMBULATORY_SURGERY_CENTER): Payer: Medicare Other | Admitting: Gastroenterology

## 2023-04-05 ENCOUNTER — Encounter: Payer: Self-pay | Admitting: Gastroenterology

## 2023-04-05 VITALS — BP 114/57 | HR 54 | Temp 98.1°F | Resp 14 | Ht 70.0 in | Wt 212.0 lb

## 2023-04-05 DIAGNOSIS — K635 Polyp of colon: Secondary | ICD-10-CM | POA: Diagnosis not present

## 2023-04-05 DIAGNOSIS — Z1211 Encounter for screening for malignant neoplasm of colon: Secondary | ICD-10-CM | POA: Diagnosis not present

## 2023-04-05 DIAGNOSIS — K6289 Other specified diseases of anus and rectum: Secondary | ICD-10-CM | POA: Diagnosis not present

## 2023-04-05 DIAGNOSIS — K6389 Other specified diseases of intestine: Secondary | ICD-10-CM | POA: Diagnosis not present

## 2023-04-05 DIAGNOSIS — D125 Benign neoplasm of sigmoid colon: Secondary | ICD-10-CM

## 2023-04-05 DIAGNOSIS — K5190 Ulcerative colitis, unspecified, without complications: Secondary | ICD-10-CM

## 2023-04-05 DIAGNOSIS — Z860101 Personal history of adenomatous and serrated colon polyps: Secondary | ICD-10-CM | POA: Diagnosis not present

## 2023-04-05 MED ORDER — SODIUM CHLORIDE 0.9 % IV SOLN
500.0000 mL | INTRAVENOUS | Status: DC
Start: 2023-04-05 — End: 2023-04-05

## 2023-04-05 NOTE — Patient Instructions (Addendum)
Resume previous diet Continue present medications Await pathology results  Handouts/information given for polyps.  YOU HAD AN ENDOSCOPIC PROCEDURE TODAY AT THE [facility]:   Refer to the procedure report that was given to you for any specific questions about what was found during the examination.  If the procedure report does not answer your questions, please call your gastroenterologist to clarify.  If you requested that your care partner not be given the details of your procedure findings, then the procedure report has been included in a sealed envelope for you to review at your convenience later.  YOU SHOULD EXPECT: Some feelings of bloating in the abdomen. Passage of more gas than usual.  Walking can help get rid of the air that was put into your GI tract during the procedure and reduce the bloating. If you had a lower endoscopy (such as a colonoscopy or flexible sigmoidoscopy) you may notice spotting of blood in your stool or on the toilet paper. If you underwent a bowel prep for your procedure, you may not have a normal bowel movement for a few days.  Please Note:  You might notice some irritation and congestion in your nose or some drainage.  This is from the oxygen used during your procedure.  There is no need for concern and it should clear up in a day or so.  SYMPTOMS TO REPORT IMMEDIATELY:  Following lower endoscopy (colonoscopy or flexible sigmoidoscopy):  Excessive amounts of blood in the stool  Significant tenderness or worsening of abdominal pains  Swelling of the abdomen that is new, acute  Fever of 100F or higher  For urgent or emergent issues, a gastroenterologist can be reached at any hour by calling [phone]. Do not use MyChart messaging for urgent concerns.    DIET:  We do recommend a small meal at first, but then you may proceed to your regular diet.  Drink plenty of fluids but you should avoid alcoholic beverages for 24 hours.  ACTIVITY:  You  should plan to take it easy for the rest of today and you should NOT DRIVE or use heavy machinery until tomorrow (because of the sedation medicines used during the test).    FOLLOW UP: Our staff will call the number listed on your records the next business day following your procedure.  We will call around 7:15- 8:00 am to check on you and address any questions or concerns that you may have regarding the information given to you following your procedure. If we do not reach you, we will leave a message.     If any biopsies were taken you will be contacted by phone or by letter within the next 1-3 weeks.  Please call us at [phone] if you have not heard about the biopsies in 3 weeks.    SIGNATURES/CONFIDENTIALITY: You and/or your care partner have signed paperwork which will be entered into your electronic medical record.  These signatures attest to the fact that that the information above on your After Visit Summary has been reviewed and is understood.  Full responsibility of the confidentiality of this discharge information lies with you and/or your care-partner.

## 2023-04-05 NOTE — Op Note (Signed)
[facility] Patient Name: Jabier Culhane Procedure Date: 04/05/2023 8:28 AM MRN: [id number] Endoscopist: Lorin Picket E. Tomasa Rand , MD, [id number] Age: 77 Referring MD:  Date of Birth: [birth year] Gender: Male Account #: [id number] Procedure:                Colonoscopy Indications:              High risk colon cancer surveillance: Ulcerative                            left sided colitis of 8 (or more) years duration,                            currently asymptomatic on Lialda 2.4 grams daily Medicines:                Monitored Anesthesia Care Procedure:                Pre-Anesthesia Assessment:                           - Prior to the procedure, a History and Physical                            was performed, and patient medications and                            allergies were reviewed. The patient's tolerance of                            previous anesthesia was also reviewed. The risks                            and benefits of the procedure and the sedation                            options and risks were discussed with the patient.                            All questions were answered, and informed consent                            was obtained. Prior Anticoagulants: The patient has                            taken no anticoagulant or antiplatelet agents. ASA                            Grade Assessment: III - A patient with severe                            systemic disease. After reviewing the risks and                            benefits, the patient was deemed in satisfactory  condition to undergo the procedure.                           After obtaining informed consent, the colonoscope                            was passed under direct vision. Throughout the                            procedure, the patient's blood pressure, pulse, and                            oxygen saturations were monitored continuously. The                            CF  [id number] #[id number] was introduced through the anus                            and advanced to the the terminal ileum, with                            identification of the appendiceal orifice and IC                            valve. The colonoscopy was somewhat difficult due                            to a redundant colon. Successful completion of the                            procedure was aided by using manual pressure and                            withdrawing and reinserting the scope. The patient                            tolerated the procedure well. The quality of the                            bowel preparation was adequate. The terminal ileum,                            ileocecal valve, appendiceal orifice, and rectum                            were photographed. The bowel preparation used was                            SUPREP via split dose instruction. Scope In: 8:37:58 AM Scope Out: 9:02:27 AM Scope Withdrawal Time: 0 hours 15 minutes 22 seconds  Total Procedure Duration: 0 hours 24 minutes 29 seconds  Findings:                 The perianal and digital rectal examinations  were                            normal. Pertinent negatives include normal                            sphincter tone and no palpable rectal lesions.                           A diffuse area of mildly congested and granular                            mucosa was found in the rectum and in the                            recto-sigmoid colon. Biopsies were taken with a                            cold forceps for histology. Estimated blood loss                            was minimal.                           A 5 mm polyp was found in the sigmoid colon. The                            polyp was sessile. The polyp was removed with a                            cold snare. Resection and retrieval were complete.                            Estimated blood loss was minimal.                           The exam was otherwise  normal throughout the                            examined colon.                           Biopsies were taken with a cold forceps in the                            sigmoid colon, in the descending colon, in the                            transverse colon, in the ascending colon and in the                            cecum for histology (separated into right and left  jars). Estimated blood loss was minimal.                           The terminal ileum appeared normal.                           The retroflexed view of the distal rectum and anal                            verge was normal and showed no anal or rectal                            abnormalities. Complications:            No immediate complications. Estimated Blood Loss:     Estimated blood loss was minimal. Impression:               - Congested and granular mucosa in the rectum and                            in the recto-sigmoid colon. Biopsied.                           - One 5 mm polyp in the sigmoid colon, removed with                            a cold snare. Resected and retrieved.                           - The examined portion of the ileum was normal.                           - The distal rectum and anal verge are normal on                            retroflexion view.                           - Biopsies were taken with a cold forceps for                            histology in the sigmoid colon, in the descending                            colon, in the transverse colon, in the ascending                            colon and in the cecum. Recommendation:           - Patient has a contact number available for                            emergencies. The signs and symptoms of potential  delayed complications were discussed with the                            patient. Return to normal activities tomorrow.                            Written discharge instructions were  provided to the                            patient.                           - Resume previous diet.                           - Continue present medications.                           - Await pathology results.                           - Repeat colonoscopy (date not yet determined) for                            surveillance based on pathology results.                           - Consider stopping dysplasia surveillance given                            patient's age if no dysplasia seen on today's                            biopsies. Valari Taylor E. Tomasa Rand, MD 04/05/2023 9:10:22 AM This report has been signed electronically.

## 2023-04-05 NOTE — Progress Notes (Signed)
Called to room to assist during endoscopic procedure.  Patient ID and intended procedure confirmed with present staff. Received instructions for my participation in the procedure from the performing physician.  

## 2023-04-05 NOTE — Progress Notes (Signed)
[facility] History and Physical   Primary Care Physician:  Donita Brooks, MD   Reason for Procedure:   High risk colon cancer/IBD surveillance  Plan:    Colonoscopy     HPI: Stuart Gentry is a 77 y.o. male undergoing surveillance colonoscopy.  He has a long history of ulcerative colitis, diagnosed in the 1980s, with macroscopic inflammation limited to the left colon with history of inactive or focal colitis in the right colon.   His last colonoscopy was in Dec 2022 by Dr. Christella Hartigan and was notable for a 5 mm polyp in the descending colon (adenoma) and erythematous/edmatous changes in the distal 20 cm of the rectum/sigmoid.  He denies any symptoms of active disease such as abdominal pain, diarrhea, urgency/tenesmus or blood in the stool.  He is taking Lialda 2 tabs daily.  Past Medical History:  Diagnosis Date   Chronic kidney disease 1965   kidney stones   Coronary artery calcification seen on CAT scan    GERD (gastroesophageal reflux disease)    Hypercholesteremia    Hypertension    Hypothyroidism    Euthyrox   Myocardial infarction [facility])    Sleep apnea    never been tested   Ulcerative colitis     Past Surgical History:  Procedure Laterality Date   COLONOSCOPY  2020   has had several due to UC   CORONARY ARTERY BYPASS GRAFT N/A 08/12/2017   Procedure: CORONARY ARTERY BYPASS GRAFTING (CABG) x six , using left internal mammory artery and right leg greater saphenous vein harvested endoscopically.;  Surgeon: Alleen Borne, MD;  Location: [facility] OR;  Service: Open Heart Surgery;  Laterality: N/A;   LEFT HEART CATH AND CORONARY ANGIOGRAPHY N/A 08/09/2017   Procedure: LEFT HEART CATH AND CORONARY ANGIOGRAPHY;  Surgeon: Kathleene Hazel, MD;  Location: [facility] INVASIVE CV LAB;  Service: Cardiovascular;  Laterality: N/A;   SKIN TAG REMOVAL Right 2018   underarm   TEE WITHOUT CARDIOVERSION N/A 08/12/2017   Procedure: TRANSESOPHAGEAL ECHOCARDIOGRAM (TEE);   Surgeon: Alleen Borne, MD;  Location: [facility];  Service: Open Heart Surgery;  Laterality: N/A;    Prior to Admission medications   Medication Sig Start Date End Date Taking? Authorizing Provider  amLODipine (NORVASC) 10 MG tablet Take 1 tablet (10 mg total) by mouth daily. 01/31/23  Yes Wendall Stade, MD  aspirin EC 81 MG tablet Take 1 tablet (81 mg total) by mouth daily. 08/05/17  Yes Wendall Stade, MD  atorvastatin (LIPITOR) 80 MG tablet TAKE 1 TABLET BY MOUTH ONCE DAILY AT  6PM 06/27/22  Yes Asa Lente, Tessa N, PA-C  benazepril (LOTENSIN) 40 MG tablet Take 1 tablet by mouth once daily 01/23/23  Yes Pickard, Priscille Heidelberg, MD  esomeprazole (NEXIUM) 20 MG capsule Take 20 mg by mouth every morning.    Yes [provider]  ezetimibe (ZETIA) 10 MG tablet Take 1 tablet (10 mg total) by mouth daily. 05/28/22  Yes Donita Brooks, MD  levothyroxine (SYNTHROID) 100 MCG tablet Take 1 tablet by mouth once daily 02/05/23  Yes Pickard, Priscille Heidelberg, MD  mesalamine (LIALDA) 1.2 g EC tablet Take 2 tablets (2.4 g total) by mouth daily. 02/15/23 02/10/24 Yes Jenel Lucks, MD  mesalamine (ROWASA) 4 g enema PLACE 60 MLS (4 G TOTAL) RECTALLY AT BEDTIME. 12/28/21  Yes Rachael Fee, MD  metoprolol tartrate (LOPRESSOR) 25 MG tablet Take 0.5 tablets (12.5 mg total) by mouth daily. 01/31/23  Yes Wendall Stade,  MD  Multiple Vitamin (MULTIVITAMIN) tablet Take 1 tablet by mouth daily.   Yes [provider]  acetaminophen (TYLENOL) 325 MG tablet Take 2 tablets (650 mg total) by mouth every 6 (six) hours as needed for mild pain. 08/17/17   Barrett, Rae Roam, PA-C  hydrocortisone (ANUSOL-HC) 25 MG suppository Place 1 suppository (25 mg total) rectally at bedtime. Patient not taking: Reported on 02/07/2023 01/29/23   Arnaldo Natal, NP  nitroGLYCERIN (NITROSTAT) 0.4 MG SL tablet Place 1 tablet (0.4 mg total) under the tongue every 5 (five) minutes as needed for chest pain. 12/01/19   Wendall Stade, MD     Current Outpatient Medications  Medication Sig Dispense Refill   amLODipine (NORVASC) 10 MG tablet Take 1 tablet (10 mg total) by mouth daily. 90 tablet 3   aspirin EC 81 MG tablet Take 1 tablet (81 mg total) by mouth daily.     atorvastatin (LIPITOR) 80 MG tablet TAKE 1 TABLET BY MOUTH ONCE DAILY AT  6PM 90 tablet 3   benazepril (LOTENSIN) 40 MG tablet Take 1 tablet by mouth once daily 90 tablet 0   esomeprazole (NEXIUM) 20 MG capsule Take 20 mg by mouth every morning.      ezetimibe (ZETIA) 10 MG tablet Take 1 tablet (10 mg total) by mouth daily. 90 tablet 3   levothyroxine (SYNTHROID) 100 MCG tablet Take 1 tablet by mouth once daily 90 tablet 0   mesalamine (LIALDA) 1.2 g EC tablet Take 2 tablets (2.4 g total) by mouth daily. 180 tablet 3   mesalamine (ROWASA) 4 g enema PLACE 60 MLS (4 G TOTAL) RECTALLY AT BEDTIME. 5400 mL 1   metoprolol tartrate (LOPRESSOR) 25 MG tablet Take 0.5 tablets (12.5 mg total) by mouth daily. 45 tablet 3   Multiple Vitamin (MULTIVITAMIN) tablet Take 1 tablet by mouth daily.     acetaminophen (TYLENOL) 325 MG tablet Take 2 tablets (650 mg total) by mouth every 6 (six) hours as needed for mild pain.     hydrocortisone (ANUSOL-HC) 25 MG suppository Place 1 suppository (25 mg total) rectally at bedtime. (Patient not taking: Reported on 02/07/2023) 5 suppository 0   nitroGLYCERIN (NITROSTAT) 0.4 MG SL tablet Place 1 tablet (0.4 mg total) under the tongue every 5 (five) minutes as needed for chest pain. 25 tablet 1   Current Facility-Administered Medications  Medication Dose Route Frequency Provider Last Rate Last Admin   0.9 %  sodium chloride infusion  500 mL Intravenous Continuous Jenel Lucks, MD        Allergies as of 04/05/2023   (No Known Allergies)    Family History  Problem Relation Age of Onset   Liver cancer Mother    Lung disease Father    Lymphoma Brother    Esophageal cancer Brother    Leukemia Brother    Heart disease Brother     Anxiety disorder Son    Hypertension Son    Colon cancer Neg Hx    Colon polyps Neg Hx    Rectal cancer Neg Hx    Stomach cancer Neg Hx     Social History   Socioeconomic History   Marital status: Married    Spouse name: Not on file   Number of children: 3   Years of education: Not on file   Highest education level: 12th grade  Occupational History   Occupation: Retired  Tobacco Use   Smoking status: Never   Smokeless tobacco: Never  Advertising account planner  Vaping status: Never Used  Substance and Sexual Activity   Alcohol use: Yes    Comment: 1-2 cans beer monthly   Drug use: No    Comment: occaasional marijuana in 1960s   Sexual activity: Yes  Other Topics Concern   Not on file  Social History Narrative   Not on file   Social Determinants of Health   Financial Resource Strain: Low Risk  (08/06/2022)   Overall Financial Resource Strain (CARDIA)    Difficulty of Paying Living Expenses: Not hard at all  Food Insecurity: No Food Insecurity (08/06/2022)   Hunger Vital Sign    Worried About Running Out of Food in the Last Year: Never true    Ran Out of Food in the Last Year: Never true  Transportation Needs: No Transportation Needs (08/06/2022)   PRAPARE - Administrator, Civil Service (Medical): No    Lack of Transportation (Non-Medical): No  Physical Activity: Insufficiently Active (08/06/2022)   Exercise Vital Sign    Days of Exercise per Week: 1 day    Minutes of Exercise per Session: 20 min  Stress: No Stress Concern Present (08/06/2022)   [employer] of Occupational Health - Occupational Stress Questionnaire    Feeling of Stress : Not at all  Social Connections: Moderately Isolated (08/06/2022)   Social Connection and Isolation Panel [NHANES]    Frequency of Communication with Friends and Family: More than three times a week    Frequency of Social Gatherings with Friends and Family: Twice a week    Attends Religious Services: Never    Database administrator or  Organizations: No    Attends Engineer, structural: Not on file    Marital Status: Married  Catering manager Violence: Not on file    Review of Systems:  All other review of systems negative except as mentioned in the HPI.  Physical Exam: Vital signs BP (!) 145/68   Pulse 60   Temp 98.1 F (36.7 C)   Ht 5\' 10"  (1.778 m)   Wt 212 lb (96.2 kg)   SpO2 98%   BMI 30.42 kg/m   General:   Alert,  Well-developed, well-nourished, pleasant and cooperative in NAD Airway:  Mallampati 1 Lungs:  Clear throughout to auscultation.   Heart:  Regular rate and rhythm; no murmurs, clicks, rubs,  or gallops. Abdomen:  Soft, nontender and nondistended. Normal bowel sounds.   Neuro/Psych:  Normal mood and affect. A and O x 3   Zeya Balles E. Tomasa Rand, MD [facility]

## 2023-04-05 NOTE — Progress Notes (Signed)
Sedate, gd SR, tolerated procedure well, VSS, report to RN 

## 2023-04-08 ENCOUNTER — Telehealth: Payer: Self-pay

## 2023-04-08 NOTE — Telephone Encounter (Signed)
  Follow up Call-     04/05/2023    7:34 AM 03/31/2021    8:42 AM  Call back number  Post procedure Call Back phone  # [phone]  Permission to leave phone message Yes Yes     Patient questions:  Do you have a fever, pain , or abdominal swelling? No. Pain Score  0 *  Have you tolerated food without any problems? Yes.    Have you been able to return to your normal activities? Yes.    Do you have any questions about your discharge instructions: Diet   No. Medications  No. Follow up visit  No.  Do you have questions or concerns about your Care? No.  Actions: * If pain score is 4 or above: No action needed, pain <4.

## 2023-04-09 LAB — SURGICAL PATHOLOGY

## 2023-04-09 NOTE — Progress Notes (Signed)
Mr. Monrroy,  The biopsies of your colon showed mild crypt architectural distortion, which is a feature of ulcerative colitis.  However, there is no active colitis noted in the majority of your colon.  In the distal 20 cm of your colon there was very mild focal disease activity.  I do not think that we need to adjust your medications or change any therapy because of this.  I would recommend you continue to take your Lialda, 2 capsules/day. The polyp that was removed was a hyperplastic polyp.  These polyps are not considered precancerous.  Surveillance colonoscopy is typically recommended in another 2 years.  However, you will be 77 years old at that time.  I think the decision to proceed with any further endoscopic surveillance should be made at that time.  I would recommend you continue to see [place] in the office on an annual basis.  We can discuss undergoing another colonoscopy in 2 years, if that is felt to be in your best interests.

## 2023-04-23 ENCOUNTER — Other Ambulatory Visit: Payer: Self-pay | Admitting: Family Medicine

## 2023-04-25 NOTE — Telephone Encounter (Signed)
LOV 02/07/2023    Follow up for 06/03/2023  Requested Prescriptions  Pending Prescriptions Disp Refills   benazepril (LOTENSIN) 40 MG tablet [Pharmacy Med Name: Benazepril HCl 40 MG Oral Tablet] 90 tablet 0    Sig: Take 1 tablet by mouth once daily     Cardiovascular:  ACE Inhibitors Failed - 04/25/2023  8:03 AM      Failed - Valid encounter within last 6 months    Recent Outpatient Visits           1 year ago Prostate cancer screening   [facility] Donita Brooks, MD   2 years ago General medical exam   [facility] Donita Brooks, MD   4 years ago General medical exam   [facility] Donita Brooks, MD   5 years ago General medical exam   [facility] Donita Brooks, MD   6 years ago Pure hypercholesterolemia   [facility] Pickard, Priscille Heidelberg, MD       Future Appointments             In 1 month Pickard, Priscille Heidelberg, MD [facility]   In 2 months Wendall Stade, MD [id number]            Passed - Cr in normal range and within 180 days    Creat  Date Value Ref Range Status  05/23/2022 1.02 0.70 - 1.28 mg/dL Final   Creatinine, Ser  Date Value Ref Range Status  01/29/2023 1.03 0.40 - 1.50 mg/dL Final         Passed - K in normal range and within 180 days    Potassium  Date Value Ref Range Status  01/29/2023 4.3 3.5 - 5.1 mEq/L Final         Passed - Patient is not pregnant      Passed - Last BP in normal range    BP Readings from Last 1 Encounters:  04/05/23 (!) 114/57

## 2023-05-06 ENCOUNTER — Other Ambulatory Visit: Payer: Self-pay | Admitting: Family Medicine

## 2023-05-29 ENCOUNTER — Other Ambulatory Visit: Payer: Medicare Other

## 2023-05-29 DIAGNOSIS — E7800 Pure hypercholesterolemia, unspecified: Secondary | ICD-10-CM

## 2023-05-29 DIAGNOSIS — I10 Essential (primary) hypertension: Secondary | ICD-10-CM

## 2023-05-29 DIAGNOSIS — E039 Hypothyroidism, unspecified: Secondary | ICD-10-CM

## 2023-05-30 LAB — LIPID PANEL
Cholesterol: 107 mg/dL (ref <200)
HDL: 40 mg/dL (ref >=40)
LDL Cholesterol (Calc): 50 mg/dL
Non-HDL Cholesterol (Calc): 67 mg/dL (ref <130)
Total CHOL/HDL Ratio: 2.7 (calc) (ref <5.0)
Triglycerides: 89 mg/dL (ref <150)

## 2023-05-30 LAB — CBC WITH DIFFERENTIAL/PLATELET
Absolute Lymphocytes: 1088 {cells}/uL (ref 850–3900)
Absolute Monocytes: 627 {cells}/uL (ref 200–950)
Basophils Absolute: 70 {cells}/uL (ref 0–200)
Basophils Relative: 1.1 %
Eosinophils Absolute: 198 {cells}/uL (ref 15–500)
Eosinophils Relative: 3.1 %
HCT: 36.3 % — ABNORMAL LOW (ref 38.5–50.0)
Hemoglobin: 12.3 g/dL — ABNORMAL LOW (ref 13.2–17.1)
MCH: 31.3 pg (ref 27.0–33.0)
MCHC: 33.9 g/dL (ref 32.0–36.0)
MCV: 92.4 fL (ref 80.0–100.0)
MPV: 11.8 fL (ref 7.5–12.5)
Monocytes Relative: 9.8 %
Neutro Abs: 4416 {cells}/uL (ref 1500–7800)
Neutrophils Relative %: 69 %
Platelets: 241 10*3/uL (ref 140–400)
RBC: 3.93 10*6/uL — ABNORMAL LOW (ref 4.20–5.80)
RDW: 12.3 % (ref 11.0–15.0)
Total Lymphocyte: 17 %
WBC: 6.4 10*3/uL (ref 3.8–10.8)

## 2023-05-30 LAB — COMPREHENSIVE METABOLIC PANEL
AG Ratio: 1.3 (calc) (ref 1.0–2.5)
ALT: 17 U/L (ref 9–46)
AST: 19 U/L (ref 10–35)
Albumin: 3.7 g/dL (ref 3.6–5.1)
Alkaline phosphatase (APISO): 85 U/L (ref 35–144)
BUN: 22 mg/dL (ref 7–25)
CO2: 23 mmol/L (ref 20–32)
Calcium: 9.5 mg/dL (ref 8.6–10.3)
Chloride: 109 mmol/L (ref 98–110)
Creat: 1.26 mg/dL (ref 0.70–1.28)
Globulin: 2.8 g/dL (ref 1.9–3.7)
Glucose, Bld: 104 mg/dL — ABNORMAL HIGH (ref 65–99)
Potassium: 4.6 mmol/L (ref 3.5–5.3)
Sodium: 140 mmol/L (ref 135–146)
Total Bilirubin: 0.6 mg/dL (ref 0.2–1.2)
Total Protein: 6.5 g/dL (ref 6.1–8.1)

## 2023-05-30 LAB — TSH: TSH: 1.17 m[IU]/L (ref 0.40–4.50)

## 2023-06-03 ENCOUNTER — Ambulatory Visit (INDEPENDENT_AMBULATORY_CARE_PROVIDER_SITE_OTHER): Payer: Medicare Other | Admitting: Family Medicine

## 2023-06-03 ENCOUNTER — Encounter: Payer: Self-pay | Admitting: Family Medicine

## 2023-06-03 VITALS — BP 124/68 | HR 56 | Temp 97.6°F | Ht 70.0 in | Wt 221.4 lb

## 2023-06-03 DIAGNOSIS — I10 Essential (primary) hypertension: Secondary | ICD-10-CM

## 2023-06-03 DIAGNOSIS — Z951 Presence of aortocoronary bypass graft: Secondary | ICD-10-CM | POA: Diagnosis not present

## 2023-06-03 DIAGNOSIS — E039 Hypothyroidism, unspecified: Secondary | ICD-10-CM | POA: Diagnosis not present

## 2023-06-03 DIAGNOSIS — Z23 Encounter for immunization: Secondary | ICD-10-CM | POA: Diagnosis not present

## 2023-06-03 DIAGNOSIS — Z0001 Encounter for general adult medical examination with abnormal findings: Secondary | ICD-10-CM

## 2023-06-03 DIAGNOSIS — E7800 Pure hypercholesterolemia, unspecified: Secondary | ICD-10-CM

## 2023-06-03 DIAGNOSIS — Z0000 Encounter for general adult medical examination without abnormal findings: Secondary | ICD-10-CM

## 2023-06-03 NOTE — Progress Notes (Signed)
Subjective:    Patient ID: Stuart Gentry, male    DOB: [birth year], 78 y.o.   MRN: [id number]  HPI Patient is here for CPE. PMH is significant for severe 3 vessel disease s/p CABG with Dr Laneta Simmers on 08/12/17.  Post op course complicated by PAF converted with amiodarone and no anticoagulation needed.   He had a colonoscopy in 12/24 which showed changes consistent with ulcerative colitis but no malignancy.  Because of the patient's age, we discussed whether we should screen for prostate cancer.  I typically do not recommend checking a PSA after 75.  The patient is asymptomatic.  Therefore we have elected against screening for prostate cancer.  He is due for capvaxive as well as the RSV vaccine.  Most recent lab work is listed below: Lab on 05/29/2023  Component Date Value Ref Range Status   WBC 05/29/2023 6.4  3.8 - 10.8 Thousand/uL Final   RBC 05/29/2023 3.93 (L)  4.20 - 5.80 Million/uL Final   Hemoglobin 05/29/2023 12.3 (L)  13.2 - 17.1 g/dL Final   HCT 40/98/1191 36.3 (L)  38.5 - 50.0 % Final   MCV 05/29/2023 92.4  80.0 - 100.0 fL Final   MCH 05/29/2023 31.3  27.0 - 33.0 pg Final   MCHC 05/29/2023 33.9  32.0 - 36.0 g/dL Final   Comment: For adults, a slight decrease in the calculated MCHC value (in the range of 30 to 32 g/dL) is most likely not clinically significant; however, it should be interpreted with caution in correlation with other red cell parameters and the patient's clinical condition.    RDW 05/29/2023 12.3  11.0 - 15.0 % Final   Platelets 05/29/2023 241  140 - 400 Thousand/uL Final   MPV 05/29/2023 11.8  7.5 - 12.5 fL Final   Neutro Abs 05/29/2023 4,416  1,500 - 7,800 cells/uL Final   Absolute Lymphocytes 05/29/2023 1,088  850 - 3,900 cells/uL Final   Absolute Monocytes 05/29/2023 627  200 - 950 cells/uL Final   Eosinophils Absolute 05/29/2023 198  15 - 500 cells/uL Final   Basophils Absolute 05/29/2023 70  0 - 200 cells/uL Final   Neutrophils Relative % 05/29/2023  69  % Final   Total Lymphocyte 05/29/2023 17.0  % Final   Monocytes Relative 05/29/2023 9.8  % Final   Eosinophils Relative 05/29/2023 3.1  % Final   Basophils Relative 05/29/2023 1.1  % Final   Glucose, Bld 05/29/2023 104 (H)  65 - 99 mg/dL Final   Comment: .            Fasting reference interval . For someone without known diabetes, a glucose value between 100 and 125 mg/dL is consistent with prediabetes and should be confirmed with a follow-up test. .    BUN 05/29/2023 22  7 - 25 mg/dL Final   Creat 47/82/9562 1.26  0.70 - 1.28 mg/dL Final   BUN/Creatinine Ratio 05/29/2023 SEE NOTE:  6 - 22 (calc) Final   Comment:    Not Reported: BUN and Creatinine are within    reference range. .    Sodium 05/29/2023 140  135 - 146 mmol/L Final   Potassium 05/29/2023 4.6  3.5 - 5.3 mmol/L Final   Chloride 05/29/2023 109  98 - 110 mmol/L Final   CO2 05/29/2023 23  20 - 32 mmol/L Final   Calcium 05/29/2023 9.5  8.6 - 10.3 mg/dL Final   Total Protein 13/11/6576 6.5  6.1 - 8.1 g/dL Final   Albumin 46/96/2952  3.7  3.6 - 5.1 g/dL Final   Globulin 13/11/6576 2.8  1.9 - 3.7 g/dL (calc) Final   AG Ratio 05/29/2023 1.3  1.0 - 2.5 (calc) Final   Total Bilirubin 05/29/2023 0.6  0.2 - 1.2 mg/dL Final   Alkaline phosphatase (APISO) 05/29/2023 85  35 - 144 U/L Final   AST 05/29/2023 19  10 - 35 U/L Final   ALT 05/29/2023 17  9 - 46 U/L Final   TSH 05/29/2023 1.17  0.40 - 4.50 mIU/L Final    Immunization History  Administered Date(s) Administered   Fluad Quad(high Dose 65+) 01/26/2020   Influenza Whole 01/29/2007   Influenza, High Dose Seasonal PF 02/15/2016, 03/14/2017, 04/04/2018, 01/28/2019, 03/13/2021   Influenza,inj,Quad PF,6+ Mos 03/21/2015   Influenza-Unspecified 01/14/2022   PFIZER Comirnaty(Gray Top)Covid-19 Tri-Sucrose Vaccine 09/21/2020   PFIZER(Purple Top)SARS-COV-2 Vaccination 05/20/2019, 06/10/2019, 01/21/2020   Pfizer Covid-19 Vaccine Bivalent Booster 33yrs & up 03/13/2021    Pneumococcal Conjugate-13 07/07/2013   Pneumococcal Polysaccharide-23 11/29/2007, 03/21/2015   Td 04/30/2005   Tdap 08/05/2022   Unspecified SARS-COV-2 Vaccination 01/14/2022     Lab on 05/29/2023  Component Date Value Ref Range Status   WBC 05/29/2023 6.4  3.8 - 10.8 Thousand/uL Final   RBC 05/29/2023 3.93 (L)  4.20 - 5.80 Million/uL Final   Hemoglobin 05/29/2023 12.3 (L)  13.2 - 17.1 g/dL Final   HCT 46/96/2952 36.3 (L)  38.5 - 50.0 % Final   MCV 05/29/2023 92.4  80.0 - 100.0 fL Final   MCH 05/29/2023 31.3  27.0 - 33.0 pg Final   MCHC 05/29/2023 33.9  32.0 - 36.0 g/dL Final   Comment: For adults, a slight decrease in the calculated MCHC value (in the range of 30 to 32 g/dL) is most likely not clinically significant; however, it should be interpreted with caution in correlation with other red cell parameters and the patient's clinical condition.    RDW 05/29/2023 12.3  11.0 - 15.0 % Final   Platelets 05/29/2023 241  140 - 400 Thousand/uL Final   MPV 05/29/2023 11.8  7.5 - 12.5 fL Final   Neutro Abs 05/29/2023 4,416  1,500 - 7,800 cells/uL Final   Absolute Lymphocytes 05/29/2023 1,088  850 - 3,900 cells/uL Final   Absolute Monocytes 05/29/2023 627  200 - 950 cells/uL Final   Eosinophils Absolute 05/29/2023 198  15 - 500 cells/uL Final   Basophils Absolute 05/29/2023 70  0 - 200 cells/uL Final   Neutrophils Relative % 05/29/2023 69  % Final   Total Lymphocyte 05/29/2023 17.0  % Final   Monocytes Relative 05/29/2023 9.8  % Final   Eosinophils Relative 05/29/2023 3.1  % Final   Basophils Relative 05/29/2023 1.1  % Final   Glucose, Bld 05/29/2023 104 (H)  65 - 99 mg/dL Final   Comment: .            Fasting reference interval . For someone without known diabetes, a glucose value between 100 and 125 mg/dL is consistent with prediabetes and should be confirmed with a follow-up test. .    BUN 05/29/2023 22  7 - 25 mg/dL Final   Creat 84/13/2440 1.26  0.70 - 1.28 mg/dL Final    BUN/Creatinine Ratio 05/29/2023 SEE NOTE:  6 - 22 (calc) Final   Comment:    Not Reported: BUN and Creatinine are within    reference range. .    Sodium 05/29/2023 140  135 - 146 mmol/L Final   Potassium 05/29/2023 4.6  3.5 - 5.3  mmol/L Final   Chloride 05/29/2023 109  98 - 110 mmol/L Final   CO2 05/29/2023 23  20 - 32 mmol/L Final   Calcium 05/29/2023 9.5  8.6 - 10.3 mg/dL Final   Total Protein 13/11/6576 6.5  6.1 - 8.1 g/dL Final   Albumin 46/96/2952 3.7  3.6 - 5.1 g/dL Final   Globulin 84/13/2440 2.8  1.9 - 3.7 g/dL (calc) Final   AG Ratio 05/29/2023 1.3  1.0 - 2.5 (calc) Final   Total Bilirubin 05/29/2023 0.6  0.2 - 1.2 mg/dL Final   Alkaline phosphatase (APISO) 05/29/2023 85  35 - 144 U/L Final   AST 05/29/2023 19  10 - 35 U/L Final   ALT 05/29/2023 17  9 - 46 U/L Final   TSH 05/29/2023 1.17  0.40 - 4.50 mIU/L Final   Immunization History  Administered Date(s) Administered   Fluad Quad(high Dose 65+) 01/26/2020   Influenza Whole 01/29/2007   Influenza, High Dose Seasonal PF 02/15/2016, 03/14/2017, 04/04/2018, 01/28/2019, 03/13/2021   Influenza,inj,Quad PF,6+ Mos 03/21/2015   Influenza-Unspecified 01/14/2022   PFIZER Comirnaty(Gray Top)Covid-19 Tri-Sucrose Vaccine 09/21/2020   PFIZER(Purple Top)SARS-COV-2 Vaccination 05/20/2019, 06/10/2019, 01/21/2020   Pfizer Covid-19 Vaccine Bivalent Booster 73yrs & up 03/13/2021   Pneumococcal Conjugate-13 07/07/2013   Pneumococcal Polysaccharide-23 11/29/2007, 03/21/2015   Td 04/30/2005   Tdap 08/05/2022   Unspecified SARS-COV-2 Vaccination 01/14/2022    Past Medical History:  Diagnosis Date   Chronic kidney disease 1965   kidney stones   Coronary artery calcification seen on CAT scan    GERD (gastroesophageal reflux disease)    Hypercholesteremia    Hypertension    Hypothyroidism    Euthyrox   Myocardial infarction (HCC)    Sleep apnea    never been tested   Ulcerative colitis    Past Surgical History:  Procedure  Laterality Date   COLONOSCOPY  2020   has had several due to UC   CORONARY ARTERY BYPASS GRAFT N/A 08/12/2017   Procedure: CORONARY ARTERY BYPASS GRAFTING (CABG) x six , using left internal mammory artery and right leg greater saphenous vein harvested endoscopically.;  Surgeon: Alleen Borne, MD;  Location: [facility] OR;  Service: Open Heart Surgery;  Laterality: N/A;   LEFT HEART CATH AND CORONARY ANGIOGRAPHY N/A 08/09/2017   Procedure: LEFT HEART CATH AND CORONARY ANGIOGRAPHY;  Surgeon: Kathleene Hazel, MD;  [facility]: [facility] INVASIVE [facility];  Service: Cardiovascular;  Laterality: N/A;   SKIN TAG REMOVAL Right 2018   underarm   TEE WITHOUT CARDIOVERSION N/A 08/12/2017   Procedure: TRANSESOPHAGEAL ECHOCARDIOGRAM (TEE);  Surgeon: Alleen Borne, MD;  Location: [facility];  Service: Open Heart Surgery;  Laterality: N/A;   Current Outpatient Medications on File Prior to Visit  Medication Sig Dispense Refill   acetaminophen (TYLENOL) 325 MG tablet Take 2 tablets (650 mg total) by mouth every 6 (six) hours as needed for mild pain.     amLODipine (NORVASC) 10 MG tablet Take 1 tablet (10 mg total) by mouth daily. 90 tablet 3   aspirin EC 81 MG tablet Take 1 tablet (81 mg total) by mouth daily.     atorvastatin (LIPITOR) 80 MG tablet TAKE 1 TABLET BY MOUTH ONCE DAILY AT  6PM 90 tablet 3   benazepril (LOTENSIN) 40 MG tablet Take 1 tablet by mouth once daily 90 tablet 0   esomeprazole (NEXIUM) 20 MG capsule Take 20 mg by mouth every morning.      ezetimibe (ZETIA) 10 MG tablet Take 1 tablet (10  mg total) by mouth daily. 90 tablet 3   levothyroxine (SYNTHROID) 100 MCG tablet Take 1 tablet by mouth once daily 90 tablet 0   mesalamine (LIALDA) 1.2 g EC tablet Take 2 tablets (2.4 g total) by mouth daily. 180 tablet 3   mesalamine (ROWASA) 4 g enema PLACE 60 MLS (4 G TOTAL) RECTALLY AT BEDTIME. (Patient taking differently: Place 4 g rectally at bedtime. PRN) 5400 mL 1   metoprolol tartrate (LOPRESSOR) 25 MG  tablet Take 0.5 tablets (12.5 mg total) by mouth daily. 45 tablet 3   Multiple Vitamin (MULTIVITAMIN) tablet Take 1 tablet by mouth daily.     nitroGLYCERIN (NITROSTAT) 0.4 MG SL tablet Place 1 tablet (0.4 mg total) under the tongue every 5 (five) minutes as needed for chest pain. 25 tablet 1   hydrocortisone (ANUSOL-HC) 25 MG suppository Place 1 suppository (25 mg total) rectally at bedtime. (Patient not taking: Reported on 06/03/2023) 5 suppository 0   No current facility-administered medications on file prior to visit.   No Known Allergies Social History   Socioeconomic History   Marital status: Married    Spouse name: Not on file   Number of children: 3   Years of education: Not on file   Highest education level: 12th grade  Occupational History   Occupation: Retired  Tobacco Use   Smoking status: Never   Smokeless tobacco: Never  Vaping Use   Vaping status: Never Used  Substance and Sexual Activity   Alcohol use: Yes    Comment: 1-2 cans beer monthly   Drug use: No    Comment: occaasional marijuana in 1960s   Sexual activity: Yes  Other Topics Concern   Not on file  Social History Narrative   Not on file   Social Drivers of Health   Financial Resource Strain: Low Risk  (08/06/2022)   Overall Financial Resource Strain (CARDIA)    Difficulty of Paying Living Expenses: Not hard at all  Food Insecurity: No Food Insecurity (08/06/2022)   Hunger Vital Sign    Worried About Running Out of Food in the Last Year: Never true    Ran Out of Food in the Last Year: Never true  Transportation Needs: No Transportation Needs (08/06/2022)   PRAPARE - Administrator, Civil Service (Medical): No    Lack of Transportation (Non-Medical): No  Physical Activity: Insufficiently Active (08/06/2022)   Exercise Vital Sign    Days of Exercise per Week: 1 day    Minutes of Exercise per Session: 20 min  Stress: No Stress Concern Present (08/06/2022)   [employer] of Occupational Health  - Occupational Stress Questionnaire    Feeling of Stress : Not at all  Social Connections: Moderately Isolated (08/06/2022)   Social Connection and Isolation Panel [NHANES]    Frequency of Communication with Friends and Family: More than three times a week    Frequency of Social Gatherings with Friends and Family: Twice a week    Attends Religious Services: Never    Database administrator or Organizations: No    Attends Engineer, structural: Not on file    Marital Status: Married  Catering manager Violence: Not on file     Review of Systems  All other systems reviewed and are negative.      Objective:   Physical Exam Vitals reviewed.  Constitutional:      General: He is not in acute distress.    Appearance: Normal appearance. He is well-developed. He  is not ill-appearing, toxic-appearing or diaphoretic.  HENT:     Head: Normocephalic and atraumatic.     Right Ear: Tympanic membrane, ear canal and external ear normal.     Left Ear: Tympanic membrane, ear canal and external ear normal.     Nose: Nose normal.     Mouth/Throat:     Pharynx: No oropharyngeal exudate.  Eyes:     General: No scleral icterus.       Right eye: No discharge.        Left eye: No discharge.     Conjunctiva/sclera: Conjunctivae normal.     Pupils: Pupils are equal, round, and reactive to light.  Neck:     Thyroid: No thyromegaly.     Vascular: No carotid bruit or JVD.     Trachea: No tracheal deviation.  Cardiovascular:     Rate and Rhythm: Bradycardia present. Rhythm irregular.     Heart sounds: Normal heart sounds. No murmur heard.    No friction rub. No gallop.  Pulmonary:     Effort: Pulmonary effort is normal. No respiratory distress.     Breath sounds: Normal breath sounds. No stridor. No wheezing, rhonchi or rales.  Chest:     Chest wall: No tenderness.  Abdominal:     General: Bowel sounds are normal. There is no distension.     Palpations: Abdomen is soft. There is no mass.      Tenderness: There is no abdominal tenderness. There is no guarding or rebound.     Hernia: A hernia is present.  Musculoskeletal:        General: No tenderness or deformity.     Cervical back: Normal range of motion and neck supple. No rigidity or tenderness.  Lymphadenopathy:     Cervical: No cervical adenopathy.  Skin:    General: Skin is warm.     Coloration: Skin is not jaundiced or pale.     Findings: No bruising, erythema, lesion or rash.  Neurological:     General: No focal deficit present.     Mental Status: He is alert and oriented to person, place, and time. Mental status is at baseline.     Cranial Nerves: No cranial nerve deficit.     Sensory: No sensory deficit.     Motor: No weakness or abnormal muscle tone.     Coordination: Coordination normal.     Deep Tendon Reflexes: Reflexes are normal and symmetric.  Psychiatric:        Behavior: Behavior normal.        Thought Content: Thought content normal.        Judgment: Judgment normal.           Assessment & Plan:  General medical exam  Hypothyroidism, unspecified type  S/P CABG x 6  Hypercholesteremia  Primary hypertension Patient received a pneumonia vaccine today.  The remainder of the vaccinations are up-to-date.  Lab work is outstanding.  We did discuss starting a low carbohydrate diet to help address his blood sugar of 104.  The patient has stable chronic mild anemia.  I do not feel that this requires any additional workup.  Colonoscopy is up-to-date.  We have elected against prostate cancer screening.  We did discuss the RSV vaccine.  Patient denies any depression, falls, or memory loss.

## 2023-06-03 NOTE — Addendum Note (Signed)
Addended by: Venia Carbon K on: 06/03/2023 10:43 AM   Modules accepted: Orders

## 2023-06-13 ENCOUNTER — Other Ambulatory Visit: Payer: Self-pay | Admitting: Physician Assistant

## 2023-06-24 NOTE — Progress Notes (Signed)
 Date:  07/02/2023   ID:  Stuart Gentry, DOB [birth year], MRN [id number]  PCP:  Donita Brooks, MD  Cardiologist:   Charlton Haws, MD   History of Present Illness:  78 y.o. seen initially 07/2017 for chest pain. CRF;s include HTN, HLD.  Retired from [employer]  Wife indicates he watches tv tood much and sedentary   TTE done 08/10/17 EF 55-60% Trivial AR normal estimated PA pressure Stress testing positive and had cath with CM on 08/09/17 Films reviewed  CTO mid LAD with severe 3 VD Referred for CABG with Dr Laneta Simmers on 08/12/17 LIMA to LAD SVG sequential D1/D2 Sequential graft to OM1/OM3 and SVG PDA Post op course complicated by PAFconverted with amiodaroe and no anticoagulation needed  D/C with amiodarone 200 bid and then d/c    Tends to be bradycardic.  Lopressor decreased to 12.5 mg daily, Benazepril added by primary for BP  Norvasc added 01/31/23   LDL50 05/29/23 with normal LFTls   Colonoscopy 12/24 with UC no malignancy. Hct 36.3   Has family in [name] and has been up there some with illnesses. Has a trip planned for [place] this spring.   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  Past Medical History:  Diagnosis Date   Chronic kidney disease 1965   kidney stones   Coronary artery calcification seen on CAT scan    GERD (gastroesophageal reflux disease)    Hypercholesteremia    Hypertension    Hypothyroidism    Euthyrox   Myocardial infarction [facility])    Sleep apnea    never been tested   Ulcerative colitis     Past Surgical History:  Procedure Laterality Date   COLONOSCOPY  2020   has had several due to UC   CORONARY ARTERY BYPASS GRAFT N/A 08/12/2017   Procedure: CORONARY ARTERY BYPASS GRAFTING (CABG) x six , using left internal mammory artery and right leg greater saphenous vein harvested endoscopically.;  Surgeon: Alleen Borne, MD;  Location: [facility] OR;  Service: Open Heart Surgery;  Laterality: N/A;   LEFT HEART CATH AND CORONARY  ANGIOGRAPHY N/A 08/09/2017   Procedure: LEFT HEART CATH AND CORONARY ANGIOGRAPHY;  Surgeon: Kathleene Hazel, MD;  [facility] INVASIVE CV LAB;  Service: Cardiovascular;  Laterality: N/A;   SKIN TAG REMOVAL Right 2018   underarm   TEE WITHOUT CARDIOVERSION N/A 08/12/2017   Procedure: TRANSESOPHAGEAL ECHOCARDIOGRAM (TEE);  Surgeon: Alleen Borne, MD;  Location: [facility] OR;  Service: Open Heart Surgery;  Laterality: N/A;     Current Outpatient Medications  Medication Sig Dispense Refill   acetaminophen (TYLENOL) 325 MG tablet Take 2 tablets (650 mg total) by mouth every 6 (six) hours as needed for mild pain.     amLODipine (NORVASC) 10 MG tablet Take 1 tablet (10 mg total) by mouth daily. 90 tablet 3   aspirin EC 81 MG tablet Take 1 tablet (81 mg total) by mouth daily.     atorvastatin (LIPITOR) 80 MG tablet TAKE 1 TABLET BY MOUTH ONCE DAILY AT 6PM 30 tablet 0   benazepril (LOTENSIN) 40 MG tablet Take 1 tablet by mouth once daily 90 tablet 0   esomeprazole (NEXIUM) 20 MG capsule Take 20 mg by mouth every morning.      ezetimibe (ZETIA) 10 MG tablet Take 1 tablet (10 mg total) by mouth daily. 90 tablet 3   hydrocortisone (ANUSOL-HC) 25 MG suppository Place 1 suppository (25 mg total) rectally at bedtime.  5 suppository 0   levothyroxine (SYNTHROID) 100 MCG tablet Take 1 tablet by mouth once daily 90 tablet 0   mesalamine (LIALDA) 1.2 g EC tablet Take 2 tablets (2.4 g total) by mouth daily. 180 tablet 3   mesalamine (ROWASA) 4 g enema PLACE 60 MLS (4 G TOTAL) RECTALLY AT BEDTIME. (Patient taking differently: Place 4 g rectally at bedtime. PRN) 5400 mL 1   metoprolol tartrate (LOPRESSOR) 25 MG tablet Take 0.5 tablets (12.5 mg total) by mouth daily. 45 tablet 3   Multiple Vitamin (MULTIVITAMIN) tablet Take 1 tablet by mouth daily.     nitroGLYCERIN (NITROSTAT) 0.4 MG SL tablet Place 1 tablet (0.4 mg total) under the tongue every 5 (five) minutes as needed for chest pain. 25 tablet 3   No  current facility-administered medications for this visit.    Allergies:   Patient has no known allergies.    Social History:  The patient  reports that he has never smoked. He has never used smokeless tobacco. He reports current alcohol use. He reports that he does not use drugs.   Family History:  The patient's family history includes Anxiety disorder in his son; Esophageal cancer in his brother; Heart disease in his brother; Hypertension in his son; Leukemia in his brother; Liver cancer in his mother; Lung disease in his father; Lymphoma in his brother.    ROS:  Please see the history of present illness.   Otherwise, review of systems are positive for none.   All other systems are reviewed and negative.    PHYSICAL EXAM: VS:  BP (!) 140/60   Pulse 60   Ht 5\' 11"  (1.803 m)   Wt 220 lb (99.8 kg)   SpO2 95%   BMI 30.68 kg/m  , BMI Body mass index is 30.68 kg/m.  No distress JVP normal  Skin warm and dry No edema No tachycpnea    EKG:  07/02/2023 SR rate 70 RBBB PR 240 msec LAFB   Recent Labs: 05/29/2023: ALT 17; BUN 22; Creat 1.26; Hemoglobin 12.3; Platelets 241; Potassium 4.6; Sodium 140; TSH 1.17    Lipid Panel    Component Value Date/Time   CHOL 107 05/29/2023 1007   CHOL 124 01/14/2018 0836   TRIG 89 05/29/2023 1007   HDL 40 05/29/2023 1007   HDL 51 01/14/2018 0836   CHOLHDL 2.7 05/29/2023 1007   VLDL 20 11/07/2016 0816   LDLCALC 50 05/29/2023 1007      Wt Readings from Last 3 Encounters:  07/02/23 220 lb (99.8 kg)  06/03/23 221 lb 6.4 oz (100.4 kg)  04/05/23 212 lb (96.2 kg)      Other studies Reviewed: Additional studies/ records that were reviewed today include: Notes primary 06/18/17 labs, Abdominal CT ECG.    ASSESSMENT AND PLAN:  1. CAD/CABG 07/2017 improved  No angina continue ASA, statin and beta blocker  2. Dyspnea:  EF normal by TTE continue weight loss and risk factor modification    4. HLD:  Continue statin LDL at goal 50 05/25/23 labs with  primary  5. PAF:  Maintaining NSR off amiodarone improved  6. Thyroid:  On replacement TSH normal f/u with Dr Tanya Nones  7. Ulcerative Colitis :  Long standing Colonoscopy 04/05/23 stable F/U Dr Tomasa Rand Given age no plans to repeat colonoscopy 8. Bradycardia:  Stable lopressor decreased to 12.5 mg once daily ECG with tri fascicular block higher risk for needing PPM Low threshold to d/c beta blocker discussed warning signs for pre syncope  Disposition:   FU with cardiology  In a year      Signed, Charlton Haws, MD  07/02/2023 9:39 AM    [facility] [address] [place], [place], [place]  [address] Phone: [phone]; Fax: [phone]

## 2023-07-02 ENCOUNTER — Ambulatory Visit: Payer: Medicare Other | Attending: Cardiovascular Disease | Admitting: Cardiovascular Disease

## 2023-07-02 VITALS — BP 140/60 | HR 60 | Ht 71.0 in | Wt 220.0 lb

## 2023-07-02 DIAGNOSIS — E782 Mixed hyperlipidemia: Secondary | ICD-10-CM

## 2023-07-02 DIAGNOSIS — I10 Essential (primary) hypertension: Secondary | ICD-10-CM

## 2023-07-02 DIAGNOSIS — I2510 Atherosclerotic heart disease of native coronary artery without angina pectoris: Secondary | ICD-10-CM | POA: Diagnosis not present

## 2023-07-02 MED ORDER — NITROGLYCERIN 0.4 MG SL SUBL: 0.4000 mg | SUBLINGUAL_TABLET | SUBLINGUAL | 3 refills | Status: AC | PRN

## 2023-07-02 NOTE — Patient Instructions (Signed)

## 2023-07-13 ENCOUNTER — Other Ambulatory Visit: Payer: Self-pay | Admitting: Cardiovascular Disease

## 2023-07-21 ENCOUNTER — Other Ambulatory Visit: Payer: Self-pay | Admitting: Family Medicine

## 2023-07-23 NOTE — Telephone Encounter (Signed)
 Last OV 06/03/23 within protocol.  Requested Prescriptions  Pending Prescriptions Disp Refills   benazepril (LOTENSIN) 40 MG tablet [Pharmacy Med Name: Benazepril HCl 40 MG Oral Tablet] 90 tablet 0    Sig: Take 1 tablet by mouth once daily     Cardiovascular:  ACE Inhibitors Failed - 07/23/2023  8:43 AM      Failed - Last BP in normal range    BP Readings from Last 1 Encounters:  07/02/23 (!) 140/60         Failed - Valid encounter within last 6 months    Recent Outpatient Visits           2 years ago Prostate cancer screening   [facility] Tanya Nones, Priscille Heidelberg, MD   3 years ago General medical exam   [facility] Donita Brooks, MD   4 years ago General medical exam   [facility] Donita Brooks, MD   6 years ago General medical exam   [facility] Donita Brooks, MD   6 years ago Pure hypercholesterolemia   [facility] Pickard, Priscille Heidelberg, MD              Passed - Cr in normal range and within 180 days    Creat  Date Value Ref Range Status  05/29/2023 1.26 0.70 - 1.28 mg/dL Final         Passed - K in normal range and within 180 days    Potassium  Date Value Ref Range Status  05/29/2023 4.6 3.5 - 5.3 mmol/L Final         Passed - Patient is not pregnant

## 2023-08-05 ENCOUNTER — Ambulatory Visit: Admitting: Podiatry

## 2023-08-05 ENCOUNTER — Encounter: Payer: Self-pay | Admitting: Podiatry

## 2023-08-05 ENCOUNTER — Other Ambulatory Visit: Payer: Self-pay | Admitting: Family Medicine

## 2023-08-05 DIAGNOSIS — L600 Ingrowing nail: Secondary | ICD-10-CM

## 2023-08-05 MED ORDER — NEOMYCIN-POLYMYXIN-HC 1 % OT SOLN: OTIC | 1 refills | Status: DC

## 2023-08-05 NOTE — Progress Notes (Signed)
 Subjective:  Patient ID: Stuart Gentry, male    DOB: 12-27-1945,  MRN: [id number] HPI Chief Complaint  Patient presents with   Toe Pain    Hallux bilateral - medial borders (R>L) - tender x several months, wife tries to trim   New Patient (Initial Visit)    78 y.o. male presents with the above complaint.   ROS: Denies fever chills nausea vomiting muscle aches pains calf pain back pain chest pain shortness of breath.  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 , using left internal mammory artery and right leg greater saphenous vein harvested endoscopically.;  Surgeon: Alleen Borne, MD;  Location: [facility] OR;  Service: Open Heart Surgery;  Laterality: N/A;   LEFT HEART CATH AND CORONARY ANGIOGRAPHY N/A 08/09/2017   Procedure: LEFT HEART CATH AND CORONARY ANGIOGRAPHY;  Surgeon: Kathleene Hazel, MD;  Location: [facility] INVASIVE CV LAB;  Service: Cardiovascular;  Laterality: N/A;   SKIN TAG REMOVAL Right 2018   underarm   TEE WITHOUT CARDIOVERSION N/A 08/12/2017   Procedure: TRANSESOPHAGEAL ECHOCARDIOGRAM (TEE);  Surgeon: Alleen Borne, MD;  Location: [facility] OR;  Service: Open Heart Surgery;  Laterality: N/A;    Current Outpatient Medications:    NEOMYCIN-POLYMYXIN-HYDROCORTISONE (CORTISPORIN) 1 % SOLN OTIC solution, Apply 1-2 drops to toe BID after soaking, Disp: 10 mL, Rfl: 1   acetaminophen (TYLENOL) 325 MG tablet, Take 2 tablets (650 mg total) by mouth every 6 (six) hours as needed for  mild pain., Disp: , Rfl:    amLODipine (NORVASC) 10 MG tablet, Take 1 tablet (10 mg total) by mouth daily., Disp: 90 tablet, Rfl: 3   aspirin EC 81 MG tablet, Take 1 tablet (81 mg total) by mouth daily., Disp: , Rfl:    atorvastatin (LIPITOR) 80 MG tablet, Take 1 tablet (80 mg total) by mouth daily at 6 PM., Disp: 90 tablet, Rfl: 3   benazepril (LOTENSIN) 40 MG tablet, Take 1 tablet by mouth once daily, Disp: 90 tablet, Rfl: 0   esomeprazole (NEXIUM) 20 MG capsule, Take 20 mg by mouth every morning. , Disp: , Rfl:    ezetimibe (ZETIA) 10 MG tablet, Take 1 tablet (10 mg total) by mouth daily., Disp: 90 tablet, Rfl: 3   hydrocortisone (ANUSOL-HC) 25 MG suppository, Place 1 suppository (25 mg total) rectally at bedtime., Disp: 5 suppository, Rfl: 0   levothyroxine (SYNTHROID) 100 MCG tablet, Take 1 tablet by mouth once daily, Disp: 90 tablet, Rfl: 0   mesalamine (LIALDA) 1.2 g EC tablet, Take 2 tablets (2.4 g total) by mouth daily., Disp: 180 tablet, Rfl: 3   mesalamine (ROWASA) 4 g enema, PLACE 60 MLS (4 G TOTAL) RECTALLY AT BEDTIME. (Patient taking differently: Place 4 g rectally at bedtime. PRN), Disp: 5400 mL, Rfl: 1   metoprolol tartrate (LOPRESSOR) 25 MG tablet, Take 0.5 tablets (12.5 mg total) by mouth daily., Disp: 45 tablet, Rfl: 3  Multiple Vitamin (MULTIVITAMIN) tablet, Take 1 tablet by mouth daily., Disp: , Rfl:    nitroGLYCERIN (NITROSTAT) 0.4 MG SL tablet, Place 1 tablet (0.4 mg total) under the tongue every 5 (five) minutes as needed for chest pain., Disp: 25 tablet, Rfl: 3  No Known Allergies Review of Systems Objective:  There were no vitals filed for this visit.  General: Well developed, nourished, in no acute distress, alert and oriented x3   Dermatological: Skin is warm, dry and supple bilateral. Nails x 10 are well maintained; remaining integument appears unremarkable at this time. There are no open sores, no preulcerative lesions, no rash or signs of infection present.  Both  tibial borders of each hallux demonstrates ingrown incurvated nail which is painful.  There is mild erythema no cellulitis drainage or odor however  Vascular: Dorsalis Pedis artery and Posterior Tibial artery pedal pulses are 2/4 bilateral with immedate capillary fill time. Pedal hair growth present. No varicosities and no lower extremity edema present bilateral.   Neruologic: Grossly intact via light touch bilateral. Vibratory intact via tuning fork bilateral. Protective threshold with Semmes Wienstein monofilament intact to all pedal sites bilateral. Patellar and Achilles deep tendon reflexes 2+ bilateral. No Babinski or clonus noted bilateral.   Musculoskeletal: No gross boney pedal deformities bilateral. No pain, crepitus, or limitation noted with foot and ankle range of motion bilateral. Muscular strength 5/5 in all groups tested bilateral.  Gait: Unassisted, Nonantalgic.    Radiographs:  None taken  Assessment & Plan:   Assessment: Ingrown toenails hallux bilateral tibial border  Plan: Chemical matrixectomy was performed today after local anesthetic was administered to the tibial borders.  He tolerated the procedure well.  He was given both oral and home-going instruction for the care of soaking of the toes as well as a prescription for Cortisporin otic to be applied twice daily after soaking.  I will follow-up with him in 2 weeks.     Mieka Leaton T. [name], [name]

## 2023-08-05 NOTE — Patient Instructions (Signed)

## 2023-08-06 ENCOUNTER — Other Ambulatory Visit: Payer: Self-pay | Admitting: Family Medicine

## 2023-08-06 NOTE — Telephone Encounter (Signed)
 Requested Prescriptions  Pending Prescriptions Disp Refills   levothyroxine (SYNTHROID) 100 MCG tablet [Pharmacy Med Name: Levothyroxine Sodium 100 MCG Oral Tablet] 90 tablet 1    Sig: Take 1 tablet by mouth once daily     Endocrinology:  Hypothyroid Agents Passed - 08/06/2023 11:27 AM      Passed - TSH in normal range and within 360 days    TSH  Date Value Ref Range Status  05/29/2023 1.17 0.40 - 4.50 mIU/L Final         Passed - Valid encounter within last 12 months    Recent Outpatient Visits           2 months ago General medical exam   [place] [facility] Donita Brooks, MD   6 months ago S/P CABG x 6   [place] [facility] Tanya Nones, Priscille Heidelberg, MD   12 months ago Primary hypertension   [place] [facility] Pickard, Priscille Heidelberg, MD   1 year ago General medical exam   [place] [facility] Pickard, Priscille Heidelberg, MD

## 2023-08-07 NOTE — Telephone Encounter (Signed)
 Requested Prescriptions  Pending Prescriptions Disp Refills   ezetimibe (ZETIA) 10 MG tablet [Pharmacy Med Name: Ezetimibe 10 MG Oral Tablet] 90 tablet 2    Sig: Take 1 tablet by mouth once daily     Cardiovascular:  Antilipid - Sterol Transport Inhibitors Failed - 08/07/2023  9:29 AM      Failed - Lipid Panel in normal range within the last 12 months    Cholesterol, Total  Date Value Ref Range Status  01/14/2018 124 100 - 199 mg/dL Final   Cholesterol  Date Value Ref Range Status  05/29/2023 107 <200 mg/dL Final   LDL Cholesterol (Calc)  Date Value Ref Range Status  05/29/2023 50 mg/dL (calc) Final    Comment:    Reference range: <100 . Desirable range <100 mg/dL for primary prevention;   <70 mg/dL for patients with CHD or diabetic patients  with > or = 2 CHD risk factors. Marland Kitchen LDL-C is now calculated using the Martin-Hopkins  calculation, which is a validated novel method providing  better accuracy than the Friedewald equation in the  estimation of LDL-C.  Horald Pollen et al. Lenox Ahr. 4098;119(14): 2061-2068  (http://education.[employer].com/faq/FAQ164)    HDL  Date Value Ref Range Status  05/29/2023 40 > OR = 40 mg/dL Final  78/29/5621 51 >30 mg/dL Final   Triglycerides  Date Value Ref Range Status  05/29/2023 89 <150 mg/dL Final         Passed - AST in normal range and within 360 days    AST  Date Value Ref Range Status  05/29/2023 19 10 - 35 U/L Final         Passed - ALT in normal range and within 360 days    ALT  Date Value Ref Range Status  05/29/2023 17 9 - 46 U/L Final         Passed - Patient is not pregnant      Passed - Valid encounter within last 12 months    Recent Outpatient Visits           2 months ago General medical exam   [place] [facility] Donita Brooks, MD   6 months ago S/P CABG x 6   [place] [facility] Donita Brooks, MD   12 months ago Primary hypertension   [place] [facility] Pickard, Priscille Heidelberg, MD   1 year ago General medical exam    [facility] Pickard, Priscille Heidelberg, MD

## 2023-08-19 ENCOUNTER — Encounter: Payer: Self-pay | Admitting: Podiatry

## 2023-08-19 ENCOUNTER — Ambulatory Visit: Admitting: Podiatry

## 2023-08-19 DIAGNOSIS — L600 Ingrowing nail: Secondary | ICD-10-CM

## 2023-08-19 DIAGNOSIS — Z9889 Other specified postprocedural states: Secondary | ICD-10-CM

## 2023-08-19 NOTE — Progress Notes (Signed)
 He presents today for a postop visit regarding his matricectomy's of the hallux bilaterally.  He states the 1 on the right continues to weep a slight amount but continues to soak in Epsom salts and warm water  twice daily.  He keeps a Band-Aid on there all the time.  Objective: Vital signs are stable alert oriented x 3 there is mild erythema along the nail fold no purulence no malodor some mild tenderness on the proximal nail fold.  Assessment: Well-healing surgical toes.  Plan: Encouraged him to increase the salt concentration of water  and do it once a day or every other day.  Instructed him to leave the Band-Aid off at nighttime when he goes to bed but continued the drops every time he soaks.  He will continue to do this until there is no erythema no pain and no drainage

## 2023-08-21 NOTE — Addendum Note (Signed)
 Addended by: Pheobe Brass E on: 08/21/2023 11:59 AM   Modules accepted: Level of Service

## 2023-10-18 ENCOUNTER — Other Ambulatory Visit: Payer: Self-pay | Admitting: Family Medicine

## 2023-12-19 ENCOUNTER — Other Ambulatory Visit: Payer: Self-pay

## 2023-12-19 ENCOUNTER — Telehealth: Payer: Self-pay | Admitting: Gastroenterology

## 2023-12-19 DIAGNOSIS — K5100 Ulcerative (chronic) pancolitis without complications: Secondary | ICD-10-CM

## 2023-12-19 MED ORDER — MESALAMINE 1.2 G PO TBEC: 2.4000 g | DELAYED_RELEASE_TABLET | Freq: Every day | ORAL | 3 refills | Status: AC

## 2023-12-19 NOTE — Telephone Encounter (Signed)
[facility]ill [facility] to [facility] 

## 2023-12-19 NOTE — Telephone Encounter (Signed)
 Inbound call from patient requesting medication refill for mesalamine  sent [facility].

## 2023-12-20 NOTE — Telephone Encounter (Signed)
 Patient was told at last visit 01/2023 to stop mesalamine  enemas; colon done 04/05/23 showed colonic mucosa with focal mild activity and crypt architectural distortion of recto-sigmoid colon.  He is requesting refills on mesalamine  enemas at this time.  Are you ok with refilling or would you like him to follow in the office if he is needing these again?

## 2023-12-20 NOTE — Telephone Encounter (Signed)
 Inbound call from patient stating they needed a refill on mesalamine  enema not tablets and if it can be sent [facility]  Please advise  Thank you

## 2023-12-23 NOTE — Telephone Encounter (Signed)
 Patient calling to f/u on previous note. Advised a nurse will follow up with him in regards. Patient advised understanding.

## 2023-12-24 MED ORDER — MESALAMINE 4 G RE ENEM: 4.0000 g | ENEMA | Freq: Every day | RECTAL | 0 refills | Status: AC

## 2023-12-24 NOTE — Telephone Encounter (Signed)
 I have spoken to patient to advise that mesalamine  enemas have been sent to [facility] and advised if his symptoms do not resolve within 4 weeks of restarting enemas, he should make an office visit for additional evaluation/treatment. Patient verbalizes understanding and states symptoms are not very bad right now, just wants to prevent them from worsening.

## 2024-01-13 ENCOUNTER — Other Ambulatory Visit: Payer: Self-pay | Admitting: Family Medicine

## 2024-01-14 NOTE — Telephone Encounter (Signed)
 Requested Prescriptions  Pending Prescriptions Disp Refills   benazepril  (LOTENSIN ) 40 MG tablet [Pharmacy Med Name: Benazepril  HCl 40 MG Oral Tablet] 90 tablet 1    Sig: Take 1 tablet by mouth once daily     Cardiovascular:  ACE Inhibitors Failed - 01/14/2024  2:11 PM      Failed - Cr in normal range and within 180 days    Creat  Date Value Ref Range Status  05/29/2023 1.26 0.70 - 1.28 mg/dL Final         Failed - K in normal range and within 180 days    Potassium  Date Value Ref Range Status  05/29/2023 4.6 3.5 - 5.3 mmol/L Final         Failed - Last BP in normal range    BP Readings from Last 1 Encounters:  07/02/23 (!) 140/60         Failed - Valid encounter within last 6 months    Recent Outpatient Visits           7 months ago General medical exam   [place] [facility] Duanne Butler DASEN, MD   11 months ago S/P CABG x 6   [place] [facility] Duanne, Butler DASEN, MD   1 year ago Primary hypertension   [place] [facility] Duanne, Butler DASEN, MD   1 year ago General medical exam   [place] [facility] Pickard, Butler DASEN, MD              Passed - Patient is not pregnant

## 2024-02-01 ENCOUNTER — Other Ambulatory Visit: Payer: Self-pay | Admitting: Family Medicine

## 2024-02-03 NOTE — Telephone Encounter (Signed)
 Requested Prescriptions  Pending Prescriptions Disp Refills   levothyroxine  (SYNTHROID ) 100 MCG tablet [Pharmacy Med Name: Levothyroxine  Sodium 100 MCG Oral Tablet] 90 tablet 0    Sig: Take 1 tablet by mouth once daily     Endocrinology:  Hypothyroid Agents Passed - 02/03/2024  1:41 PM      Passed - TSH in normal range and within 360 days    TSH  Date Value Ref Range Status  05/29/2023 1.17 0.40 - 4.50 mIU/L Final         Passed - Valid encounter within last 12 months    Recent Outpatient Visits           8 months ago General medical exam   [place] [facility] Duanne Butler DASEN, MD   12 months ago S/P CABG x 6   [place] [facility] Duanne Butler DASEN, MD   1 year ago Primary hypertension   [place] [facility] Duanne, Butler DASEN, MD   1 year ago General medical exam   [place] [facility] Pickard, Butler DASEN, MD

## 2024-02-06 ENCOUNTER — Other Ambulatory Visit: Payer: Self-pay | Admitting: Family Medicine

## 2024-02-06 NOTE — Telephone Encounter (Signed)
 Copied from CRM #[id number]. Topic: Clinical - Medication Question >> Feb 06, 2024  9:42 AM Shanda MATSU wrote: Reason for CRM: Patient called in regarding refill req for med, levothyroxine  (SYNTHROID ) 100 MCG tablet, patient stated that [facility] has sent over 2 requests for medication to be refilled but req has not been completed, adv patient that I show req ws approved and sent back to pharmacy, adv pharmacy confirmed receipt of med approval on 02/03/2024 1:41pm,  E-Prescribing Status: Receipt confirmed by pharmacy (02/03/2024  1:41 PM EDT). I also confirmed with the patient that it was sent to the correct pharmacy. Patient is req that this info be double checked as he just spoke with [facility] before calling us  and was adv that they have not recvd anything back, patient is scheduled to go out of town on 10/12 and wants to make sure that he has enough medication when he leaves, he only has 6 pills left at the moment, is req call back with status. >> Feb 06, 2024 10:10 AM Tiffini S wrote: Patient called about a prescription for levothyroxine  (SYNTHROID ) 100 MCG tablet - he talked with the pharmacy and the manufacturer have changed with [facility] need a phone call or faxed approval Patient have about six tablets left and he will be out of town starting on 02/09/24. Called [facility], spoke with front desk- please contact patient at [phone]

## 2024-02-11 ENCOUNTER — Other Ambulatory Visit: Payer: Self-pay | Admitting: Gastroenterology

## 2024-02-11 DIAGNOSIS — K5100 Ulcerative (chronic) pancolitis without complications: Secondary | ICD-10-CM

## 2024-03-25 ENCOUNTER — Other Ambulatory Visit: Payer: Self-pay | Admitting: Cardiovascular Disease

## 2024-04-08 ENCOUNTER — Encounter: Payer: Self-pay | Admitting: Gastroenterology

## 2024-04-08 ENCOUNTER — Ambulatory Visit: Admitting: Gastroenterology

## 2024-04-08 VITALS — BP 138/88 | HR 58

## 2024-04-08 DIAGNOSIS — K5100 Ulcerative (chronic) pancolitis without complications: Secondary | ICD-10-CM

## 2024-04-08 DIAGNOSIS — K5909 Other constipation: Secondary | ICD-10-CM | POA: Diagnosis not present

## 2024-04-08 DIAGNOSIS — K5900 Constipation, unspecified: Secondary | ICD-10-CM

## 2024-04-08 NOTE — Progress Notes (Signed)
 Discussed the use of AI scribe software for clinical note transcription with the patient, who gave verbal consent to proceed.  HPI : Stuart Gentry is a 78 year old male with a history of coronary artery disease status post CABG and longstanding ulcerative colitis who presents for annual follow up.  He was last seen in December 2024 when he underwent his surveillance colonoscopy.  The colonoscopy showed mostly normal mucosa throughout, but with a focal area of mild inflammatory changes in the rectosigmoid colon.  Biopsies confirmed mild active colitis in this area, otherwise there was no active colitis and no dysplasia.  A small hyperplastic polyp was removed.  Today he reports that he experiences infrequent bowel movements, previously occurring once every two weeks, but now improved to twice a week with the use of Rowasa  enemas. He describes a sensation of a lump in his abdomen when bending over, which resolves after a bowel movement. The enemas seem to help with bowel movements.  He is currently taking two Lialda  tablets daily in the morning and has been using Rowasa  enemas daily for the past two weeks.  He has been on Lialda  since June 2023 when he was switched from sulfasalazine .  He acknowledges not drinking enough water  and not consuming much fruit. He has not recently used a fiber supplement.   He also mentions a lack of physical activity, which he believes might help with bowel movements.   His bowel movements are initially formed and solid, but towards the end, they become thinner and looser, though not diarrhea. He feels completely emptied after a bowel movement. No blood in his stool is noted.  He denies any symptoms of urgency or tenesmus.       Review of pertinent gastrointestinal problems:   1. Diagnosed with UC in mid 1980s in [place] bloody diarrhea; Moved to [facility], GI MD Dr. Leotha cared for 1990s; was on prednisone  early on in problem. Seems to flare 1-2 per year with urgency,  mucous output maybe a bit of blood. Will take rowasa  enemas for a month. Chronically on sulfasalazine . He has been getting q two year colonoscopies.Has believes that he has only had a very limited left sided disease. Multiple colonoscopies done over the past 20 years. I reviewed many of them from Dr. Leotha; it is clear that he has had only macroscopic disease in the rectosigmoid colon up to about 25 cm however biopsies of his descending, right colon have also shown changes of chronic inflammatory bowel disease several times. His last colonoscopy outside was January 2013. Colonoscopy 05/2013 Teressa terminal ileum was normal. There was clear, moderate inflammation from anus to about 60cm proximal (very indistinct transition to normal mucosa). The right colon was essentially normal appearing. Biopsies showed chronic and active inflammation in left colon; right colon was normal. 06/2012 changed to mesalamine  full strength, nightly rowasa .  2015, continued intermittent flares, offered several options for medical management, decided to increase the sulfasalazine  to 6 g daily, also mesalamine  enemas. Colonoscopy Dr. Teressa 10/2016: Normal TI, mild inflammation to 30cm (path quiencent colitis), focal hepatic flexure inflammation (chronic moderately active colitis). Random colon biopsies without dysplasia.  Colonoscopy August 2020 Dr. Teressa the terminal ileum was normal, the distal colon was mildly inflamed overtly with discrete transition from abnormal to normal mucosa around 40 cm from the anus.  Extensive biopsies were taken.  Right colon biopsies showed inactive colitis and colitis and the left colon showed active mild inflammation.  He was recommended to  have follow-up in 1 to 2 months after adjusting his medicines.  We have not heard from him since.  Colonoscopy December 2022 showed normal terminal ileum single 5 mm tubular adenoma was removed.  Right colon was normal, left colon was normal.  Rectosigmoid was  erythematous with a non a discrete transition from normal to abnormal mucosa around 20 cm.  Biopsies from right and left colon were normal, biopsies from rectosigmoid showed chronic inflammation.  I asked that he resume his mesalamine  enemas on a daily basis rather than every 2 or 3 days.  Colonoscopy Dec 2024 showed mostly normal mucosa with an area of mildly inflamed mucosa in the rectosigmoid colon with biopsies showing mild activitiy.  Biopsies of the left and right colon did not show active colitis.  A 5 mm hyperplastic polyp was removed from the sigmoid colon 2. Upper endoscopy 2013, small 'ulcers in stomach.' Ulcers had healed on nexium daily. Small submucosal lesion in EGD, eventually evaluated at [facility]. DR. Marguerite by EUS, was told it was fatty lesion (lipoma) and it needed no further following. 3. Ischemic colitis (likely) 08/2016: acute abd pain, prominantly inflammed sigmoid segment on CT 08/2016      Past Medical History:  Diagnosis Date   Chronic kidney disease 1965   kidney stones   Coronary artery calcification seen on CAT scan    GERD (gastroesophageal reflux disease)    Hypercholesteremia    Hypertension    Hypothyroidism    Euthyrox    Myocardial infarction [facility])    Sleep apnea    never been tested   Ulcerative colitis      Past Surgical History:  Procedure Laterality Date   COLONOSCOPY  2020   has had several due to UC   CORONARY ARTERY BYPASS GRAFT N/A 08/12/2017   Procedure: CORONARY ARTERY BYPASS GRAFTING (CABG) x six , using left internal mammory artery and right leg greater saphenous vein harvested endoscopically.;  Surgeon: Lucas Dorise POUR, MD;  Location: [facility] OR;  Service: Open Heart Surgery;  Laterality: N/A;   LEFT HEART CATH AND CORONARY ANGIOGRAPHY N/A 08/09/2017   Procedure: LEFT HEART CATH AND CORONARY ANGIOGRAPHY;  Surgeon: Verlin Lonni BIRCH, MD;  Location: [facility] INVASIVE CV LAB;  Service: Cardiovascular;  Laterality: N/A;   SKIN TAG REMOVAL Right 2018    underarm   TEE WITHOUT CARDIOVERSION N/A 08/12/2017   Procedure: TRANSESOPHAGEAL ECHOCARDIOGRAM (TEE);  Surgeon: Lucas Dorise POUR, MD;  Location: [facility] OR;  Service: Open Heart Surgery;  Laterality: N/A;   Family History  Problem Relation Age of Onset   Liver cancer Mother    Lung disease Father    Lymphoma Brother    Esophageal cancer Brother    Leukemia Brother    Heart disease Brother    Anxiety disorder Son    Hypertension Son    Colon cancer Neg Hx    Colon polyps Neg Hx    Rectal cancer Neg Hx    Stomach cancer Neg Hx    Social History   Tobacco Use   Smoking status: Never   Smokeless tobacco: Never  Vaping Use   Vaping status: Never Used  Substance Use Topics   Alcohol use: Yes    Comment: 1-2 cans beer monthly   Drug use: No    Comment: occaasional marijuana in 1960s   Current Outpatient Medications  Medication Sig Dispense Refill   acetaminophen  (TYLENOL ) 325 MG tablet Take 2 tablets (650 mg total) by mouth every 6 (six) hours as needed for  mild pain.     amLODipine  (NORVASC ) 10 MG tablet Take 1 tablet by mouth once daily 90 tablet 0   aspirin  EC 81 MG tablet Take 1 tablet (81 mg total) by mouth daily.     atorvastatin  (LIPITOR ) 80 MG tablet Take 1 tablet (80 mg total) by mouth daily at 6 PM. 90 tablet 3   benazepril  (LOTENSIN ) 40 MG tablet Take 1 tablet by mouth once daily 90 tablet 1   esomeprazole (NEXIUM) 20 MG capsule Take 20 mg by mouth every morning.      ezetimibe  (ZETIA ) 10 MG tablet Take 1 tablet by mouth once daily 90 tablet 2   hydrocortisone  (ANUSOL -HC) 25 MG suppository Place 1 suppository (25 mg total) rectally at bedtime. 5 suppository 0   levothyroxine  (SYNTHROID ) 100 MCG tablet Take 1 tablet by mouth once daily 90 tablet 0   mesalamine  (LIALDA ) 1.2 g EC tablet Take 2 tablets (2.4 g total) by mouth daily. 180 tablet 3   mesalamine  (ROWASA ) 4 g enema Place 60 mLs (4 g total) rectally at bedtime. 5400 mL 0   metoprolol  tartrate (LOPRESSOR ) 25 MG tablet  Take 1/2 (one-half) tablet by mouth once daily 45 tablet 0   Multiple Vitamin (MULTIVITAMIN) tablet Take 1 tablet by mouth daily.     NEOMYCIN -POLYMYXIN-HYDROCORTISONE  (CORTISPORIN) 1 % SOLN OTIC solution Apply 1-2 drops to toe BID after soaking 10 mL 1   nitroGLYCERIN  (NITROSTAT ) 0.4 MG SL tablet Place 1 tablet (0.4 mg total) under the tongue every 5 (five) minutes as needed for chest pain. 25 tablet 3   No current facility-administered medications for this visit.   No Known Allergies   Review of Systems: All systems reviewed and negative except where noted in HPI.    No results found.  Physical Exam: BP 138/88   Pulse (!) 58   SpO2 96%  Constitutional: Pleasant,well-developed, Caucasian male in no acute distress.  Accompanied by wife HEENT: Normocephalic and atraumatic. Conjunctivae are normal. No scleral icterus. Neck supple.  Cardiovascular: Normal rate, regular rhythm.  Pulmonary/chest: Effort normal and breath sounds normal. No wheezing, rales or rhonchi. Abdominal: Soft, nondistended, nontender.  Small, soft, reducible umbilical hernia present.  Diastases recti present.  Bowel sounds active throughout. There are no masses palpable. No hepatomegaly. Extremities: no edema Lymphadenopathy: No cervical adenopathy noted. Neurological: Alert and oriented to person place and time. Skin: Skin is warm and dry. No rashes noted. Psychiatric: Normal mood and affect. Behavior is normal.  CBC    Component Value Date/Time   WBC 6.4 05/29/2023 1403   RBC 3.93 (L) 05/29/2023 1403   HGB 12.3 (L) 05/29/2023 1403   HGB 12.0 (L) 09/04/2017 1145   HCT 36.3 (L) 05/29/2023 1403   HCT 35.5 (L) 09/04/2017 1145   PLT 241 05/29/2023 1403   PLT 297 09/04/2017 1145   MCV 92.4 05/29/2023 1403   MCV 94 09/04/2017 1145   MCH 31.3 05/29/2023 1403   MCHC 33.9 05/29/2023 1403   RDW 12.3 05/29/2023 1403   RDW 14.2 09/04/2017 1145   LYMPHSABS 1.1 01/29/2023 1531   LYMPHSABS 1.0 08/06/2017 1325    MONOABS 0.6 01/29/2023 1531   EOSABS 198 05/29/2023 1403   EOSABS 0.1 08/06/2017 1325   BASOSABS 70 05/29/2023 1403   BASOSABS 0.0 08/06/2017 1325    CMP     Component Value Date/Time   NA 140 05/29/2023 1403   NA 141 09/04/2017 1145   K 4.6 05/29/2023 1403   CL 109 05/29/2023 1403  CO2 23 05/29/2023 1403   GLUCOSE 104 (H) 05/29/2023 1403   BUN 22 05/29/2023 1403   BUN 15 09/04/2017 1145   CREATININE 1.26 05/29/2023 1403   CALCIUM  9.5 05/29/2023 1403   PROT 6.5 05/29/2023 1403   PROT 6.4 01/14/2018 0836   ALBUMIN  3.7 01/29/2023 1531   ALBUMIN  3.9 01/14/2018 0836   AST 19 05/29/2023 1403   ALT 17 05/29/2023 1403   ALKPHOS 85 01/29/2023 1531   BILITOT 0.6 05/29/2023 1403   BILITOT 0.3 01/14/2018 0836   GFRNONAA 64 04/18/2020 0839   GFRAA 74 04/18/2020 0839       Latest Ref Rng & Units 05/29/2023    2:03 PM 01/29/2023    3:31 PM 05/23/2022    8:07 AM  CBC EXTENDED  WBC 3.8 - 10.8 Thousand/uL 6.4  6.2  5.9   RBC 4.20 - 5.80 Million/uL 3.93  3.99  4.22   Hemoglobin 13.2 - 17.1 g/dL 87.6  87.8  86.7   HCT 38.5 - 50.0 % 36.3  36.7  38.1   Platelets 140 - 400 Thousand/uL 241  240.0  211   NEUT# 1,500 - 7,800 cells/uL 4,416  4.2  3,511   Lymph# 0.7 - 4.0 K/uL  1.1  1,363       ASSESSMENT AND PLAN:  78 year old male with longstanding ulcerative pancolitis, with minimal disease activity on Lialda .  He did have mild focal active colitis last December, but currently has no symptoms to suggest active disease.  His fecal calprotectin was also previously elevated in August of last year.  I would like to see if this is persistently elevated.  If still elevated, I recommend we treat with daily enemas for 4 to 8 weeks and then recheck calprotectin.  He is currently using his Rowasa  enemas more to help him go to the bathroom.  We discussed how this medication is not designed to help with constipation, but to help with inflammation from ulcerative colitis.  If his calprotectin is not  elevated, I recommended he stop taking the Rowasa  enemas and continue Lialda  alone.  His main symptom is constipation.  We discussed how constipation is not typically an indication of active ulcerative colitis.  We discussed ways to help manage his constipation to include improving his fiber intake, water  consumption and physical activity.  I recommend he take a daily fiber supplement and to use an as needed Senokot or Dulcolax as needed for when no bowel movement in 3 days.  Ulcerative pancolitis Mild ulcerative pancolitis with mild inflammation in the rectosigmoid colon noted on colonoscopy in December 2024. Disease well-controlled with no significant symptoms. Fecal calprotectin to assess inflammation levels. - Ordered fecal calprotectin test to assess inflammation levels. - If fecal calprotectin is elevated, continue Rowasa  enemas daily for 4-6 weeks. - If fecal calprotectin is normal, discontinue Rowasa  enemas. - Continue Lialda  2 tablets daily in the morning. - Provided Mediterranean diet handout for ulcerative colitis management.  Constipation Chronic constipation with bowel movements twice a week. Improvement noted with Rowasa  enemas. Stool formed and solid. Low dietary fiber, physical activity, and hydration. - Recommended daily Metamucil to improve stool bulk and consistency. - Encouraged increased physical activity and hydration. - Provided handout for constipation management. - If no bowel movement for 3 days, consider using Dulcolax or Senokot as needed.  Recording duration: 16 minutes   I spent a total of 30 minutes reviewing the patient's medical record, interviewing and examining the patient, discussing her diagnosis and management of her  condition going forward, and documenting in the medical record     Vannesa Abair E. Stacia, MD [facility]     Pickard, Butler DASEN, MD

## 2024-04-08 NOTE — Patient Instructions (Signed)
 Your provider has requested that you go to the basement level for lab work before leaving today. Press B on the elevator. The lab is located at the first door on the left as you exit the elevator.  Please purchase the following medications over the counter and take as directed:Metamucil daily and Sennakot 1-2 tablets as needed when you have not had a bowel movement in 3 days.   Continue Lialda  2 tablets daily.   _______________________________________________________  If your blood pressure at your visit was 140/90 or greater, please contact your primary care physician to follow up on this.  _______________________________________________________  If you are age 73 or older, your body mass index should be between 23-30. Your There is no height or weight on file to calculate BMI. If this is out of the aforementioned range listed, please consider follow up with your Primary Care Provider.  If you are age 59 or younger, your body mass index should be between 19-25. Your There is no height or weight on file to calculate BMI. If this is out of the aformentioned range listed, please consider follow up with your Primary Care Provider.   ________________________________________________________  The [facility] providers would like to encourage you to use MYCHART to communicate with providers for non-urgent requests or questions.  Due to long hold times on the telephone, sending your provider a message by [facility] may be a faster and more efficient way to get a response.  Please allow 48 business hours for a response.  Please remember that this is for non-urgent requests.  _______________________________________________________  [facility] is using a team-based approach to care.  Your team is made up of your doctor and two to three APPS. Our APPS (Nurse Practitioners and Physician Assistants) work with your physician to ensure care continuity for you. They are fully qualified to address  your health concerns and develop a treatment plan. They communicate directly with your gastroenterologist to care for you. Seeing the Advanced Practice Practitioners on your physician's team can help you by facilitating care more promptly, often allowing for earlier appointments, access to diagnostic testing, procedures, and other specialty referrals.   Mediterranean Diet A Mediterranean diet is based on the traditions of countries on the [employer]. It focuses on eating more: Fruits and vegetables. Whole grains, beans, nuts, and seeds. Heart-healthy fats. These are fats that are good for your heart. It involves eating less: Dairy. Meat and eggs. Processed foods with added sugar, salt, and fat. This type of diet can help prevent certain conditions. It can also improve outcomes if you have a long-term (chronic) disease, such as kidney or heart disease. What are tips for following this plan? Reading food labels Check packaged foods for: The serving size. For foods such as rice and pasta, the serving size is the amount of cooked product, not dry. The total fat. Avoid foods with saturated fat or trans fat. Added sugars, such as corn syrup. Shopping  Try to have a balanced diet. Buy a variety of foods, such as: Fresh fruits and vegetables. You may be able to get these from local farmers markets. You can also buy them frozen. Grains, beans, nuts, and seeds. Some of these can be bought in bulk. Fresh seafood. Poultry and eggs. Low-fat dairy products. Buy whole ingredients instead of foods that have already been packaged. If you can't get fresh seafood, buy precooked frozen shrimp or canned fish, such as tuna, salmon, or sardines. Stock your pantry so you always have certain foods  on hand, such as olive oil, canned tuna, canned tomatoes, rice, pasta, and beans. Cooking Cook foods with extra-virgin olive oil instead of using butter or other vegetable oils. Have meat as a side dish. Have  vegetables or grains as your main dish. This means having meat in small portions or adding small amounts of meat to foods like pasta or stew. Use beans or vegetables instead of meat in common dishes like chili or lasagna. Try out different cooking methods. Try roasting, broiling, steaming, and sauting vegetables. Add frozen vegetables to soups, stews, pasta, or rice. Add nuts or seeds for added healthy fats and plant protein at each meal. You can add these to yogurt, salads, or vegetable dishes. Marinate fish or vegetables using olive oil, lemon juice, garlic, and fresh herbs. Meal planning Plan to eat a vegetarian meal one day each week. Try to work up to two vegetarian meals, if possible. Eat seafood two or more times a week. Have healthy snacks on hand. These may include: Vegetable sticks with hummus. Greek yogurt. Fruit and nut trail mix. Eat balanced meals. These should include: Fruit: 2-3 servings a day. Vegetables: 4-5 servings a day. Low-fat dairy: 2 servings a day. Fish, poultry, or lean meat: 1 serving a day. Beans and legumes: 2 or more servings a week. Nuts and seeds: 1-2 servings a day. Whole grains: 6-8 servings a day. Extra-virgin olive oil: 3-4 servings a day. Limit red meat and sweets to just a few servings a month. Lifestyle  Try to cook and eat meals with your family. Drink enough fluid to keep your pee (urine) pale yellow. Be active every day. This includes: Aerobic exercise, which is exercise that causes your heart to beat faster. Examples include running and swimming. Leisure activities like gardening, walking, or housework. Get 7-8 hours of sleep each night. Drink red wine if your provider says you can. A glass of wine is 5 oz (150 mL). You may be allowed to have: Up to 1 glass a day if you're male and not pregnant. Up to 2 glasses a day if you're male. What foods should I eat? Fruits Apples. Apricots. Avocado. Berries. Bananas. Cherries. Dates. Figs.  Grapes. Lemons. Melon. Oranges. Peaches. Plums. Pomegranate. Vegetables Artichokes. Beets. Broccoli. Cabbage. Carrots. Eggplant. Green beans. Chard. Kale. Spinach. Onions. Leeks. Peas. Squash. Tomatoes. Peppers. Radishes. Grains Whole-grain pasta. Brown rice. Bulgur wheat. Polenta. Couscous. Whole-wheat bread. [place] Madeira. Meats and other proteins Beans. Almonds. Sunflower seeds. Pine nuts. Peanuts. Cod. Salmon. Scallops. Shrimp. Tuna. Tilapia. Clams. Oysters. Eggs. Chicken or turkey without skin. Dairy Low-fat milk. Cheese. Greek yogurt. Fats and oils Extra-virgin olive oil. Avocado oil. Grapeseed oil. Beverages Water . Red wine. Herbal tea. Sweets and desserts Greek yogurt with honey. Baked apples. Poached pears. Trail mix. Seasonings and condiments Basil. Cilantro. Coriander. Cumin. Mint. Parsley. Sage. Rosemary. Tarragon. Garlic. Oregano. Thyme. Pepper. Balsamic vinegar. Tahini. Hummus. Tomato sauce. Olives. Mushrooms. The items listed above may not be all the foods and drinks you can have. Talk to a dietitian to learn more. What foods should I limit? This is a list of foods that should be eaten rarely. Fruits Fruit canned in syrup. Vegetables Deep-fried potatoes, like French fries. Grains Packaged pasta or rice dishes. Cereal with added sugar. Snacks with added sugar. Meats and other proteins Beef. Pork. Lamb. Chicken or turkey with skin. Hot dogs. [place]. Dairy Ice cream. Sour cream. Whole milk. Fats and oils Butter. Canola oil. Vegetable oil. Beef fat (tallow). Lard. Beverages Juice. Sugar-sweetened soft drinks. Beer.  Liquor and spirits. Sweets and desserts Cookies. Cakes. Pies. Candy. Seasonings and condiments Mayonnaise. Pre-made sauces and marinades. The items listed above may not be all the foods and drinks you should limit. Talk to a dietitian to learn more. Where to find more information American Heart Association (AHA): heart.org This information is not  intended to replace advice given to you by your health care provider. Make sure you discuss any questions you have with your health care provider. Document Revised: 07/29/2022 Document Reviewed: 07/29/2022 Elsevier Patient Education  2024 [employer].

## 2024-04-15 ENCOUNTER — Other Ambulatory Visit

## 2024-04-15 DIAGNOSIS — K5100 Ulcerative (chronic) pancolitis without complications: Secondary | ICD-10-CM

## 2024-04-21 LAB — CALPROTECTIN: Calprotectin: 89 ug/g

## 2024-04-24 ENCOUNTER — Ambulatory Visit: Payer: Self-pay | Admitting: Gastroenterology

## 2024-04-24 NOTE — Progress Notes (Signed)
 Stuart Gentry,  Your fecal calprotectin is normal.  This indicates that your ulcerative colitis is controlled and is unlikely to be a cause of any of your GI symptoms.   Please take the fiber supplements and increase water  consumption as discussed to help with your constipation.  You do not need to take the mesalamine  enemas.  Continue your Lialda  everyday.  Follow up with me in the office in 12 months, sooner if your symptoms change.

## 2024-04-29 ENCOUNTER — Other Ambulatory Visit: Payer: Self-pay | Admitting: Family Medicine

## 2024-05-02 ENCOUNTER — Other Ambulatory Visit: Payer: Self-pay | Admitting: Family Medicine

## 2024-07-16 ENCOUNTER — Ambulatory Visit: Admitting: Cardiology
